# Patient Record
Sex: Female | Born: 1970 | Race: Black or African American | Hispanic: No | Smoking: Never smoker
Health system: Southern US, Community
[De-identification: ages and names within clinical notes are randomized; demographics above are authoritative.]

## PROBLEM LIST (undated history)

## (undated) DIAGNOSIS — K219 Gastro-esophageal reflux disease without esophagitis: Secondary | ICD-10-CM

## (undated) DIAGNOSIS — T7840XA Allergy, unspecified, initial encounter: Secondary | ICD-10-CM

## (undated) DIAGNOSIS — R7303 Prediabetes: Secondary | ICD-10-CM

## (undated) DIAGNOSIS — M5137 Other intervertebral disc degeneration, lumbosacral region: Secondary | ICD-10-CM

## (undated) HISTORY — DX: Other intervertebral disc degeneration, lumbosacral region: M51.37

## (undated) HISTORY — DX: Gastro-esophageal reflux disease without esophagitis: K21.9

## (undated) HISTORY — DX: Allergy, unspecified, initial encounter: T78.40XA

## (undated) HISTORY — DX: Prediabetes: R73.03

---

## 2009-12-04 DIAGNOSIS — M51379 Other intervertebral disc degeneration, lumbosacral region without mention of lumbar back pain or lower extremity pain: Secondary | ICD-10-CM

## 2009-12-04 DIAGNOSIS — M5137 Other intervertebral disc degeneration, lumbosacral region: Secondary | ICD-10-CM

## 2009-12-04 HISTORY — DX: Other intervertebral disc degeneration, lumbosacral region: M51.37

## 2009-12-04 HISTORY — DX: Other intervertebral disc degeneration, lumbosacral region without mention of lumbar back pain or lower extremity pain: M51.379

## 2009-12-08 ENCOUNTER — Encounter: Admission: RE | Admit: 2009-12-08 | Discharge: 2009-12-08 | Payer: Self-pay | Admitting: Infectious Diseases

## 2010-05-20 ENCOUNTER — Emergency Department (HOSPITAL_COMMUNITY): Admission: EM | Admit: 2010-05-20 | Discharge: 2010-05-20 | Payer: Self-pay | Admitting: Family Medicine

## 2010-08-25 ENCOUNTER — Encounter: Admission: RE | Admit: 2010-08-25 | Discharge: 2010-08-25 | Payer: Self-pay | Admitting: Orthopedic Surgery

## 2010-09-12 ENCOUNTER — Encounter: Admission: RE | Admit: 2010-09-12 | Discharge: 2010-09-12 | Payer: Self-pay | Admitting: Orthopedic Surgery

## 2010-10-07 ENCOUNTER — Encounter: Admission: RE | Admit: 2010-10-07 | Discharge: 2010-10-07 | Payer: Self-pay | Admitting: Orthopedic Surgery

## 2011-02-19 LAB — POCT URINALYSIS DIP (DEVICE)
Glucose, UA: NEGATIVE mg/dL
Hgb urine dipstick: NEGATIVE
Ketones, ur: NEGATIVE mg/dL
Nitrite: NEGATIVE
Protein, ur: NEGATIVE mg/dL
Specific Gravity, Urine: 1.02 (ref 1.005–1.030)
Urobilinogen, UA: 1 mg/dL (ref 0.0–1.0)
pH: 5.5 (ref 5.0–8.0)

## 2011-02-19 LAB — POCT PREGNANCY, URINE: Preg Test, Ur: NEGATIVE

## 2012-12-04 DIAGNOSIS — R7303 Prediabetes: Secondary | ICD-10-CM

## 2012-12-04 HISTORY — DX: Prediabetes: R73.03

## 2013-03-04 ENCOUNTER — Other Ambulatory Visit (INDEPENDENT_AMBULATORY_CARE_PROVIDER_SITE_OTHER): Payer: No Typology Code available for payment source

## 2013-03-04 ENCOUNTER — Ambulatory Visit: Payer: No Typology Code available for payment source

## 2013-03-04 ENCOUNTER — Encounter: Payer: Self-pay | Admitting: Internal Medicine

## 2013-03-04 ENCOUNTER — Ambulatory Visit (INDEPENDENT_AMBULATORY_CARE_PROVIDER_SITE_OTHER): Payer: No Typology Code available for payment source | Admitting: Internal Medicine

## 2013-03-04 ENCOUNTER — Ambulatory Visit: Payer: Self-pay | Admitting: Internal Medicine

## 2013-03-04 VITALS — BP 130/72 | HR 78 | Temp 98.1°F | Ht 67.0 in | Wt 181.0 lb

## 2013-03-04 DIAGNOSIS — Z13220 Encounter for screening for lipoid disorders: Secondary | ICD-10-CM

## 2013-03-04 DIAGNOSIS — Z1329 Encounter for screening for other suspected endocrine disorder: Secondary | ICD-10-CM

## 2013-03-04 DIAGNOSIS — Z0000 Encounter for general adult medical examination without abnormal findings: Secondary | ICD-10-CM

## 2013-03-04 DIAGNOSIS — M5430 Sciatica, unspecified side: Secondary | ICD-10-CM | POA: Insufficient documentation

## 2013-03-04 DIAGNOSIS — Z131 Encounter for screening for diabetes mellitus: Secondary | ICD-10-CM

## 2013-03-04 DIAGNOSIS — M5432 Sciatica, left side: Secondary | ICD-10-CM

## 2013-03-04 DIAGNOSIS — Z1239 Encounter for other screening for malignant neoplasm of breast: Secondary | ICD-10-CM

## 2013-03-04 DIAGNOSIS — R1013 Epigastric pain: Secondary | ICD-10-CM

## 2013-03-04 DIAGNOSIS — Z130 Encounter for screening for diseases of the blood and blood-forming organs and certain disorders involving the immune mechanism: Secondary | ICD-10-CM

## 2013-03-04 LAB — CBC
HCT: 41.7 % (ref 36.0–46.0)
MCHC: 33.5 g/dL (ref 30.0–36.0)
MCV: 84.2 fl (ref 78.0–100.0)
Platelets: 264 10*3/uL (ref 150.0–400.0)
RBC: 4.95 Mil/uL (ref 3.87–5.11)
RDW: 12.8 % (ref 11.5–14.6)
WBC: 4.1 10*3/uL — ABNORMAL LOW (ref 4.5–10.5)

## 2013-03-04 LAB — BASIC METABOLIC PANEL
CO2: 27 mEq/L (ref 19–32)
Calcium: 8.8 mg/dL (ref 8.4–10.5)
Chloride: 107 mEq/L (ref 96–112)
GFR: 108.03 mL/min (ref >60.00)
Glucose, Bld: 106 mg/dL — ABNORMAL HIGH (ref 70–99)
Potassium: 4.4 mEq/L (ref 3.5–5.1)
Sodium: 139 mEq/L (ref 135–145)

## 2013-03-04 LAB — HEPATIC FUNCTION PANEL
ALT: 29 U/L (ref 0–35)
AST: 25 U/L (ref 0–37)
Albumin: 3.7 g/dL (ref 3.5–5.2)
Alkaline Phosphatase: 62 U/L (ref 39–117)
Bilirubin, Direct: 0.1 mg/dL (ref 0.0–0.3)
Total Bilirubin: 0.6 mg/dL (ref 0.3–1.2)
Total Protein: 7.2 g/dL (ref 6.0–8.3)

## 2013-03-04 LAB — LIPID PANEL
Cholesterol: 129 mg/dL (ref 0–200)
HDL: 49.9 mg/dL (ref >39.00)
Total CHOL/HDL Ratio: 3
Triglycerides: 63 mg/dL (ref 0.0–149.0)

## 2013-03-04 LAB — HEMOGLOBIN A1C: Hgb A1c MFr Bld: 5.8 % (ref 4.6–6.5)

## 2013-03-04 LAB — TSH: TSH: 1.93 u[IU]/mL (ref 0.35–5.50)

## 2013-03-04 MED ORDER — PREDNISONE 10 MG PO TABS: ORAL_TABLET | ORAL | Status: DC

## 2013-03-04 NOTE — Progress Notes (Signed)
HPI  Pt presents to the clinic today to establish care. She has not seen a physician since 2010, when she came to the [place] from [place]. She does have some concerns today. 1- she has lower back pain on the left with sharp shooting pains into her left leg. She does take Advil for this but it doesn't always seem to help.  She denies having an injury to her back. She denies loss of bowel or bladder. She does have a very intense job where she bends, lifts and reaches all the time. She thinks this is a result of her job. 2- She is having epigastric pain with nausea. This is a new problem for her. She has not vomited. She reports that it feels different than reflux. The pain in not made worse or relieved by eating. Sometimes the pain radiates through to her back.  Flu; never Tetanus: 2010 LMP: 12/2012 (on depo) Pap smear: 2010 Mammogram: never Eye doctor: never Dentist :as needed  History reviewed. No pertinent past medical history.  Current Outpatient Prescriptions  Medication Sig Dispense Refill  . ibuprofen (ADVIL,MOTRIN) 200 MG tablet Take 200 mg by mouth every 6 (six) hours as needed for pain.      . medroxyPROGESTERone (DEPO-PROVERA) 150 MG/ML injection Inject 150 mg into the muscle every 3 (three) months.       No current facility-administered medications for this visit.    No Known Allergies  History reviewed. No pertinent family history.  History   Social History  . Marital Status: Unknown    Spouse Name: N/A    Number of Children: N/A  . Years of Education: N/A   Occupational History  . housekeeping    Social History Main Topics  . Smoking status: Never Smoker   . Smokeless tobacco: Never Used  . Alcohol Use: Yes     Comment: once month  . Drug Use: No  . Sexually Active: Yes   Other Topics Concern  . Not on file   Social History Narrative   Regular exercise-yes   Caffeine Use-on occasion    ROS:  Constitutional: Pt reports intermittent headaches.  Denies fever, malaise, fatigue, or abrupt weight changes.  HEENT: Pt reports intermittent scratchy throat. Denies eye pain, eye redness, ear pain, ringing in the ears, wax buildup, runny nose, nasal congestion, bloody nose, or sore throat. Respiratory: Denies difficulty breathing, shortness of breath, cough or sputum production.   Cardiovascular: Denies chest pain, chest tightness, palpitations or swelling in the hands or feet.  Gastrointestinal: Pt reports constant epigastric pain. Denies bloating, constipation, diarrhea or blood in the stool.  GU: Denies frequency, urgency, pain with urination, blood in urine, odor or discharge. Musculoskeletal: Pt reports low back pain. Denies decrease in range of motion, difficulty with gait or joint pain and swelling.  Skin: Denies redness, rashes, lesions or ulcercations.  Neurological: Pt reports sharp shooting pains into her left leg. Denies dizziness, difficulty with memory, difficulty with speech or problems with balance and coordination.   No other specific complaints in a complete review of systems (except as listed in HPI above).  PE:  BP 130/72  Pulse 78  Temp(Src) 98.1 F (36.7 C) (Oral)  Ht 5\' 7"  (1.702 m)  Wt 181 lb (82.101 kg)  BMI 28.34 kg/m2  SpO2 99% Wt Readings from Last 3 Encounters:  03/04/13 181 lb (82.101 kg)    General: Appears her stated age, well developed, well nourished in NAD. HEENT: Head: normal shape and size; Eyes:  sclera white, no icterus, conjunctiva pink, PERRLA and EOMs intact; Ears: Tm's gray and intact, normal light reflex; Nose: mucosa pink and moist, septum midline; Throat/Mouth: Teeth present, mucosa pink and moist, no lesions or ulcerations noted.  Neck: Normal range of motion. Neck supple, trachea midline. No massses, lumps or thyromegaly present.  Cardiovascular: Normal rate and rhythm. S1,S2 noted.  Murmur noted. No rubs or gallops noted. No JVD or BLE edema. No carotid bruits noted. Pulmonary/Chest:  Normal effort and positive vesicular breath sounds. No respiratory distress. No wheezes, rales or ronchi noted.  Abdomen: Soft and tender in the epigastric area. Normal bowel sounds, no bruits noted. No distention or masses noted. Liver, spleen and kidneys non palpable. Musculoskeletal: Normal range of motion. No signs of joint swelling. No difficulty with gait.  Neurological: Alert and oriented. Cranial nerves II-XII intact. Coordination normal. +DTRs bilaterally. Positive straight leg raise. Psychiatric: Mood and affect normal. Behavior is normal. Judgment and thought content normal.    Assessment and Plan:  Preventative Health Maintenance:  Will obtain basic screening labs today Pt will call to set up pap smear Will set up for mammogram Pt declines flu shot today

## 2013-03-04 NOTE — Assessment & Plan Note (Signed)
Will obtain LFT's to look for cholecystitis Will obtain abdominal ultrasound to r/o gallstones Pt declined RX for anti nausea medicine at this time

## 2013-03-04 NOTE — Assessment & Plan Note (Signed)
Pt has had xray of lumbar spine Will send over records from xray [facility] for pred taper Perform stretching exercises as indicated on handout

## 2013-03-04 NOTE — Patient Instructions (Signed)
Preventive Care for Adults, Female A healthy lifestyle and preventive care can promote health and wellness. Preventive health guidelines for women include the following key practices.  A routine yearly physical is a good way to check with your caregiver about your health and preventive screening. It is a chance to share any concerns and updates on your health, and to receive a thorough exam.  Visit your dentist for a routine exam and preventive care every 6 months. Brush your teeth twice a day and floss once a day. Good oral hygiene prevents tooth decay and gum disease.  The frequency of eye exams is based on your age, health, family medical history, use of contact lenses, and other factors. Follow your caregiver's recommendations for frequency of eye exams.  Eat a healthy diet. Foods like vegetables, fruits, whole grains, low-fat dairy products, and lean protein foods contain the nutrients you need without too many calories. Decrease your intake of foods high in solid fats, added sugars, and salt. Eat the right amount of calories for you.Get information about a proper diet from your caregiver, if necessary.  Regular physical exercise is one of the most important things you can do for your health. Most adults should get at least 150 minutes of moderate-intensity exercise (any activity that increases your heart rate and causes you to sweat) each week. In addition, most adults need muscle-strengthening exercises on 2 or more days a week.  Maintain a healthy weight. The body mass index (BMI) is a screening tool to identify possible weight problems. It provides an estimate of body fat based on height and weight. Your caregiver can help determine your BMI, and can help you achieve or maintain a healthy weight.For adults 20 years and older:  A BMI below 18.5 is considered underweight.  A BMI of 18.5 to 24.9 is normal.  A BMI of 25 to 29.9 is considered overweight.  A BMI of 30 and above is  considered obese.  Maintain normal blood lipids and cholesterol levels by exercising and minimizing your intake of saturated fat. Eat a balanced diet with plenty of fruit and vegetables. Blood tests for lipids and cholesterol should begin at age 20 and be repeated every 5 years. If your lipid or cholesterol levels are high, you are over 50, or you are at high risk for heart disease, you may need your cholesterol levels checked more frequently.Ongoing high lipid and cholesterol levels should be treated with medicines if diet and exercise are not effective.  If you smoke, find out from your caregiver how to quit. If you do not use tobacco, do not start.  If you are pregnant, do not drink alcohol. If you are breastfeeding, be very cautious about drinking alcohol. If you are not pregnant and choose to drink alcohol, do not exceed 1 drink per day. One drink is considered to be 12 ounces (355 mL) of beer, 5 ounces (148 mL) of wine, or 1.5 ounces (44 mL) of liquor.  Avoid use of street drugs. Do not share needles with anyone. Ask for help if you need support or instructions about stopping the use of drugs.  High blood pressure causes heart disease and increases the risk of stroke. Your blood pressure should be checked at least every 1 to 2 years. Ongoing high blood pressure should be treated with medicines if weight loss and exercise are not effective.  If you are 55 to 42 years old, ask your caregiver if you should take aspirin to prevent strokes.  Diabetes   screening involves taking a blood sample to check your fasting blood sugar level. This should be done once every 3 years, after age 45, if you are within normal weight and without risk factors for diabetes. Testing should be considered at a younger age or be carried out more frequently if you are overweight and have at least 1 risk factor for diabetes.  Breast cancer screening is essential preventive care for women. You should practice "breast  self-awareness." This means understanding the normal appearance and feel of your breasts and may include breast self-examination. Any changes detected, no matter how small, should be reported to a caregiver. Women in their 20s and 30s should have a clinical breast exam (CBE) by a caregiver as part of a regular health exam every 1 to 3 years. After age 40, women should have a CBE every year. Starting at age 40, women should consider having a mammography (breast X-ray test) every year. Women who have a family history of breast cancer should talk to their caregiver about genetic screening. Women at a high risk of breast cancer should talk to their caregivers about having magnetic resonance imaging (MRI) and a mammography every year.  The Pap test is a screening test for cervical cancer. A Pap test can show cell changes on the cervix that might become cervical cancer if left untreated. A Pap test is a procedure in which cells are obtained and examined from the lower end of the uterus (cervix).  Women should have a Pap test starting at age 21.  Between ages 21 and 29, Pap tests should be repeated every 2 years.  Beginning at age 30, you should have a Pap test every 3 years as long as the past 3 Pap tests have been normal.  Some women have medical problems that increase the chance of getting cervical cancer. Talk to your caregiver about these problems. It is especially important to talk to your caregiver if a new problem develops soon after your last Pap test. In these cases, your caregiver may recommend more frequent screening and Pap tests.  The above recommendations are the same for women who have or have not gotten the vaccine for human papillomavirus (HPV).  If you had a hysterectomy for a problem that was not cancer or a condition that could lead to cancer, then you no longer need Pap tests. Even if you no longer need a Pap test, a regular exam is a good idea to make sure no other problems are  starting.  If you are between ages 65 and 70, and you have had normal Pap tests going back 10 years, you no longer need Pap tests. Even if you no longer need a Pap test, a regular exam is a good idea to make sure no other problems are starting.  If you have had past treatment for cervical cancer or a condition that could lead to cancer, you need Pap tests and screening for cancer for at least 20 years after your treatment.  If Pap tests have been discontinued, risk factors (such as a new sexual partner) need to be reassessed to determine if screening should be resumed.  The HPV test is an additional test that may be used for cervical cancer screening. The HPV test looks for the virus that can cause the cell changes on the cervix. The cells collected during the Pap test can be tested for HPV. The HPV test could be used to screen women aged 30 years and older, and should   be used in women of any age who have unclear Pap test results. After the age of 30, women should have HPV testing at the same frequency as a Pap test.  Colorectal cancer can be detected and often prevented. Most routine colorectal cancer screening begins at the age of 50 and continues through age 75. However, your caregiver may recommend screening at an earlier age if you have risk factors for colon cancer. On a yearly basis, your caregiver may provide home test kits to check for hidden blood in the stool. Use of a small camera at the end of a tube, to directly examine the colon (sigmoidoscopy or colonoscopy), can detect the earliest forms of colorectal cancer. Talk to your caregiver about this at age 50, when routine screening begins. Direct examination of the colon should be repeated every 5 to 10 years through age 75, unless early forms of pre-cancerous polyps or small growths are found.  Hepatitis C blood testing is recommended for all people born from 1945 through 1965 and any individual with known risks for hepatitis C.  Practice  safe sex. Use condoms and avoid high-risk sexual practices to reduce the spread of sexually transmitted infections (STIs). STIs include gonorrhea, chlamydia, syphilis, trichomonas, herpes, HPV, and human immunodeficiency virus (HIV). Herpes, HIV, and HPV are viral illnesses that have no cure. They can result in disability, cancer, and death. Sexually active women aged 25 and younger should be checked for chlamydia. Older women with new or multiple partners should also be tested for chlamydia. Testing for other STIs is recommended if you are sexually active and at increased risk.  Osteoporosis is a disease in which the bones lose minerals and strength with aging. This can result in serious bone fractures. The risk of osteoporosis can be identified using a bone density scan. Women ages 65 and over and women at risk for fractures or osteoporosis should discuss screening with their caregivers. Ask your caregiver whether you should take a calcium supplement or vitamin D to reduce the rate of osteoporosis.  Menopause can be associated with physical symptoms and risks. Hormone replacement therapy is available to decrease symptoms and risks. You should talk to your caregiver about whether hormone replacement therapy is right for you.  Use sunscreen with sun protection factor (SPF) of 30 or more. Apply sunscreen liberally and repeatedly throughout the day. You should seek shade when your shadow is shorter than you. Protect yourself by wearing long sleeves, pants, a wide-brimmed hat, and sunglasses year round, whenever you are outdoors.  Once a month, do a whole body skin exam, using a mirror to look at the skin on your back. Notify your caregiver of new moles, moles that have irregular borders, moles that are larger than a pencil eraser, or moles that have changed in shape or color.  Stay current with required immunizations.  Influenza. You need a dose every fall (or winter). The composition of the flu vaccine  changes each year, so being vaccinated once is not enough.  Pneumococcal polysaccharide. You need 1 to 2 doses if you smoke cigarettes or if you have certain chronic medical conditions. You need 1 dose at age 65 (or older) if you have never been vaccinated.  Tetanus, diphtheria, pertussis (Tdap, Td). Get 1 dose of Tdap vaccine if you are younger than age 65, are over 65 and have contact with an infant, are a healthcare worker, are pregnant, or simply want to be protected from whooping cough. After that, you need a Td   booster dose every 10 years. Consult your caregiver if you have not had at least 3 tetanus and diphtheria-containing shots sometime in your life or have a deep or dirty wound.  HPV. You need this vaccine if you are a woman age 26 or younger. The vaccine is given in 3 doses over 6 months.  Measles, mumps, rubella (MMR). You need at least 1 dose of MMR if you were born in 1957 or later. You may also need a second dose.  Meningococcal. If you are age 19 to 21 and a first-year college student living in a residence hall, or have one of several medical conditions, you need to get vaccinated against meningococcal disease. You may also need additional booster doses.  Zoster (shingles). If you are age 60 or older, you should get this vaccine.  Varicella (chickenpox). If you have never had chickenpox or you were vaccinated but received only 1 dose, talk to your caregiver to find out if you need this vaccine.  Hepatitis A. You need this vaccine if you have a specific risk factor for hepatitis A virus infection or you simply wish to be protected from this disease. The vaccine is usually given as 2 doses, 6 to 18 months apart.  Hepatitis B. You need this vaccine if you have a specific risk factor for hepatitis B virus infection or you simply wish to be protected from this disease. The vaccine is given in 3 doses, usually over 6 months. Preventive Services / Frequency Ages 19 to 39  Blood  pressure check.** / Every 1 to 2 years.  Lipid and cholesterol check.** / Every 5 years beginning at age 20.  Clinical breast exam.** / Every 3 years for women in their 20s and 30s.  Pap test.** / Every 2 years from ages 21 through 29. Every 3 years starting at age 30 through age 65 or 70 with a history of 3 consecutive normal Pap tests.  HPV screening.** / Every 3 years from ages 30 through ages 65 to 70 with a history of 3 consecutive normal Pap tests.  Hepatitis C blood test.** / For any individual with known risks for hepatitis C.  Skin self-exam. / Monthly.  Influenza immunization.** / Every year.  Pneumococcal polysaccharide immunization.** / 1 to 2 doses if you smoke cigarettes or if you have certain chronic medical conditions.  Tetanus, diphtheria, pertussis (Tdap, Td) immunization. / A one-time dose of Tdap vaccine. After that, you need a Td booster dose every 10 years.  HPV immunization. / 3 doses over 6 months, if you are 26 and younger.  Measles, mumps, rubella (MMR) immunization. / You need at least 1 dose of MMR if you were born in 1957 or later. You may also need a second dose.  Meningococcal immunization. / 1 dose if you are age 19 to 21 and a first-year college student living in a residence hall, or have one of several medical conditions, you need to get vaccinated against meningococcal disease. You may also need additional booster doses.  Varicella immunization.** / Consult your caregiver.  Hepatitis A immunization.** / Consult your caregiver. 2 doses, 6 to 18 months apart.  Hepatitis B immunization.** / Consult your caregiver. 3 doses usually over 6 months. Ages 40 to 64  Blood pressure check.** / Every 1 to 2 years.  Lipid and cholesterol check.** / Every 5 years beginning at age 20.  Clinical breast exam.** / Every year after age 40.  Mammogram.** / Every year beginning at age 40   and continuing for as long as you are in good health. Consult with your  caregiver.  Pap test.** / Every 3 years starting at age 30 through age 65 or 70 with a history of 3 consecutive normal Pap tests.  HPV screening.** / Every 3 years from ages 30 through ages 65 to 70 with a history of 3 consecutive normal Pap tests.  Fecal occult blood test (FOBT) of stool. / Every year beginning at age 50 and continuing until age 75. You may not need to do this test if you get a colonoscopy every 10 years.  Flexible sigmoidoscopy or colonoscopy.** / Every 5 years for a flexible sigmoidoscopy or every 10 years for a colonoscopy beginning at age 50 and continuing until age 75.  Hepatitis C blood test.** / For all people born from 1945 through 1965 and any individual with known risks for hepatitis C.  Skin self-exam. / Monthly.  Influenza immunization.** / Every year.  Pneumococcal polysaccharide immunization.** / 1 to 2 doses if you smoke cigarettes or if you have certain chronic medical conditions.  Tetanus, diphtheria, pertussis (Tdap, Td) immunization.** / A one-time dose of Tdap vaccine. After that, you need a Td booster dose every 10 years.  Measles, mumps, rubella (MMR) immunization. / You need at least 1 dose of MMR if you were born in 1957 or later. You may also need a second dose.  Varicella immunization.** / Consult your caregiver.  Meningococcal immunization.** / Consult your caregiver.  Hepatitis A immunization.** / Consult your caregiver. 2 doses, 6 to 18 months apart.  Hepatitis B immunization.** / Consult your caregiver. 3 doses, usually over 6 months. Ages 65 and over  Blood pressure check.** / Every 1 to 2 years.  Lipid and cholesterol check.** / Every 5 years beginning at age 20.  Clinical breast exam.** / Every year after age 40.  Mammogram.** / Every year beginning at age 40 and continuing for as long as you are in good health. Consult with your caregiver.  Pap test.** / Every 3 years starting at age 30 through age 65 or 70 with a 3  consecutive normal Pap tests. Testing can be stopped between 65 and 70 with 3 consecutive normal Pap tests and no abnormal Pap or HPV tests in the past 10 years.  HPV screening.** / Every 3 years from ages 30 through ages 65 or 70 with a history of 3 consecutive normal Pap tests. Testing can be stopped between 65 and 70 with 3 consecutive normal Pap tests and no abnormal Pap or HPV tests in the past 10 years.  Fecal occult blood test (FOBT) of stool. / Every year beginning at age 50 and continuing until age 75. You may not need to do this test if you get a colonoscopy every 10 years.  Flexible sigmoidoscopy or colonoscopy.** / Every 5 years for a flexible sigmoidoscopy or every 10 years for a colonoscopy beginning at age 50 and continuing until age 75.  Hepatitis C blood test.** / For all people born from 1945 through 1965 and any individual with known risks for hepatitis C.  Osteoporosis screening.** / A one-time screening for women ages 65 and over and women at risk for fractures or osteoporosis.  Skin self-exam. / Monthly.  Influenza immunization.** / Every year.  Pneumococcal polysaccharide immunization.** / 1 dose at age 65 (or older) if you have never been vaccinated.  Tetanus, diphtheria, pertussis (Tdap, Td) immunization. / A one-time dose of Tdap vaccine if you are over   65 and have contact with an infant, are a healthcare worker, or simply want to be protected from whooping cough. After that, you need a Td booster dose every 10 years.  Varicella immunization.** / Consult your caregiver.  Meningococcal immunization.** / Consult your caregiver.  Hepatitis A immunization.** / Consult your caregiver. 2 doses, 6 to 18 months apart.  Hepatitis B immunization.** / Check with your caregiver. 3 doses, usually over 6 months. ** Family history and personal history of risk and conditions may change your caregiver's recommendations. Document Released: 01/16/2002 Document Revised: 02/12/2012  Document Reviewed: 04/17/2011 [employer] Patient Information 2013 [employer].  

## 2013-03-12 ENCOUNTER — Ambulatory Visit (HOSPITAL_COMMUNITY)
Admission: RE | Admit: 2013-03-12 | Discharge: 2013-03-12 | Disposition: A | Discharge status: Home / Self Care | Payer: Self-pay | Source: Ambulatory Visit | Attending: Internal Medicine | Admitting: Internal Medicine

## 2013-03-12 DIAGNOSIS — Z1231 Encounter for screening mammogram for malignant neoplasm of breast: Secondary | ICD-10-CM | POA: Insufficient documentation

## 2013-03-12 DIAGNOSIS — R1013 Epigastric pain: Secondary | ICD-10-CM | POA: Insufficient documentation

## 2013-03-12 DIAGNOSIS — Z0000 Encounter for general adult medical examination without abnormal findings: Secondary | ICD-10-CM

## 2013-03-12 DIAGNOSIS — Z1239 Encounter for other screening for malignant neoplasm of breast: Secondary | ICD-10-CM

## 2013-04-02 ENCOUNTER — Encounter: Payer: Self-pay | Admitting: Internal Medicine

## 2013-04-02 ENCOUNTER — Other Ambulatory Visit (HOSPITAL_COMMUNITY)
Admission: RE | Admit: 2013-04-02 | Discharge: 2013-04-02 | Disposition: A | Discharge status: Home / Self Care | Payer: No Typology Code available for payment source | Source: Ambulatory Visit | Attending: Internal Medicine | Admitting: Internal Medicine

## 2013-04-02 ENCOUNTER — Ambulatory Visit (INDEPENDENT_AMBULATORY_CARE_PROVIDER_SITE_OTHER): Payer: No Typology Code available for payment source | Admitting: Internal Medicine

## 2013-04-02 VITALS — BP 110/82 | HR 75 | Temp 98.2°F | Ht 67.0 in | Wt 181.0 lb

## 2013-04-02 DIAGNOSIS — Z01419 Encounter for gynecological examination (general) (routine) without abnormal findings: Secondary | ICD-10-CM | POA: Insufficient documentation

## 2013-04-02 DIAGNOSIS — R1013 Epigastric pain: Secondary | ICD-10-CM

## 2013-04-02 DIAGNOSIS — Z124 Encounter for screening for malignant neoplasm of cervix: Secondary | ICD-10-CM

## 2013-04-02 NOTE — Patient Instructions (Signed)

## 2013-04-02 NOTE — Progress Notes (Signed)
  Subjective:    Patient ID: Tami Martin, female    DOB: Sep 22, 1971, 42 y.o.   MRN: [id number]  HPI  Pt presents to the clinic today for her pap smear. Her period did just start 1 hour ago. It is light. She does have some concerns today about the continue abdominal pain. The pain is constant 6/10 in the left upper quadrant. It hurts without relation to eating. She feels like she can feel a lump there. Ultrasound was negative for mass or gallstones. She would like further evaluation today.  Review of Systems      History reviewed. No pertinent past medical history.  Current Outpatient Prescriptions  Medication Sig Dispense Refill  . Calcium Carbonate-Vit D-Min 1200-1000 MG-UNIT CHEW Chew 1 capsule by mouth daily.      Marland Kitchen ibuprofen (ADVIL,MOTRIN) 200 MG tablet Take 200 mg by mouth every 6 (six) hours as needed for pain.      . medroxyPROGESTERone (DEPO-PROVERA) 150 MG/ML injection Inject 150 mg into the muscle every 3 (three) months.       No current facility-administered medications for this visit.    No Known Allergies  History reviewed. No pertinent family history.  History   Social History  . Marital Status: Married    Spouse Name: N/A    Number of Children: N/A  . Years of Education: N/A   Occupational History  . housekeeping    Social History Main Topics  . Smoking status: Never Smoker   . Smokeless tobacco: Never Used  . Alcohol Use: Yes     Comment: once month  . Drug Use: No  . Sexually Active: Yes   Other Topics Concern  . Not on file   Social History Narrative   Regular exercise-yes   Caffeine Use-on occasion     Constitutional: Denies fever, malaise, fatigue, headache or abrupt weight changes.  Gastrointestinal: Pt reports abdominal pain. Denies bloating, constipation, diarrhea or blood in the stool.  GU: Pt is on her menstrual cycle. Denies urgency, frequency, pain with urination, burning sensation, blood in urine, odor or discharge.  No  other specific complaints in a complete review of systems (except as listed in HPI above).  Objective:   Physical Exam   Constitutional:  Alert, oriented x 4, well developed, well nourished in no apparent distress.  Cardiovascular: Normal rate and rhythm. S1,S2 noted.  No murmur, rubs or gallops noted. No JVD or BLE edema. No carotid bruits noted. Pulmonary/Chest: Normal effort and positive vesicular breath sounds. No respiratory distress. No wheezes, rales or ronchi noted.  Abdomenl: Soft and nontender. Normal bowel sounds, no bruits noted.palpable mass noted in LUQ with cough.. Liver, spleen and kidneys non palpable. Genitourinary: Normal female anatomy. Uterus midline, anterior and soft. No CMT or discharge noted. Adenexa non palpable. Breast without lumps or masses. Small amount of vaginal bleeding noted.      Assessment & Plan:   Screen for cervical cancer with routine gyn exam:  Pap smear obtained, will send tissue off for sampling Bimanual performed  LUQ versus epigastric pain with palpable mass but negative ultrasound:  Concern for hiatal hernia Will refer to GI for further evaluation

## 2013-04-21 ENCOUNTER — Encounter: Payer: Self-pay | Admitting: Internal Medicine

## 2013-05-07 ENCOUNTER — Other Ambulatory Visit: Payer: Self-pay | Admitting: Internal Medicine

## 2013-05-07 ENCOUNTER — Ambulatory Visit (INDEPENDENT_AMBULATORY_CARE_PROVIDER_SITE_OTHER): Payer: No Typology Code available for payment source | Admitting: Internal Medicine

## 2013-05-07 ENCOUNTER — Ambulatory Visit (AMBULATORY_SURGERY_CENTER): Payer: No Typology Code available for payment source | Admitting: Internal Medicine

## 2013-05-07 ENCOUNTER — Encounter: Payer: Self-pay | Admitting: Internal Medicine

## 2013-05-07 ENCOUNTER — Telehealth: Payer: Self-pay

## 2013-05-07 VITALS — BP 130/76 | HR 60 | Ht 67.0 in | Wt 178.0 lb

## 2013-05-07 VITALS — BP 155/83 | HR 67 | Temp 99.3°F | Resp 20 | Ht 67.0 in | Wt 178.0 lb

## 2013-05-07 DIAGNOSIS — R634 Abnormal weight loss: Secondary | ICD-10-CM

## 2013-05-07 DIAGNOSIS — R109 Unspecified abdominal pain: Secondary | ICD-10-CM

## 2013-05-07 MED ORDER — SODIUM CHLORIDE 0.9 % IV SOLN: 500.0000 mL | INTRAVENOUS | Status: DC

## 2013-05-07 NOTE — Progress Notes (Signed)
Patient did not have preoperative order for IV antibiotic SSI prophylaxis. (G8918)  Patient did not experience any of the following events: a burn prior to discharge; a fall within the facility; wrong site/side/patient/procedure/implant event; or a hospital transfer or hospital admission upon discharge from the facility. (G8907)  

## 2013-05-07 NOTE — Op Note (Signed)
[facility] [address] [place], [address]   ENDOSCOPY PROCEDURE REPORT  PATIENT: Tami Martin, Tami Martin  MR#: [id number] BIRTHDATE: Apr 28, 1971 , 42  yrs. old GENDER: Female ENDOSCOPIST: Roxy Cedar, MD REFERRED BY:  .  Nicki Reaper, NP PROCEDURE DATE:  05/07/2013 PROCEDURE:  EGD, diagnostic ASA CLASS:     Class I INDICATIONS:  abdominal pain.   Weight loss. MEDICATIONS: MAC sedation, administered by CRNA and propofol (Diprivan) 300mg  IV TOPICAL ANESTHETIC: none  DESCRIPTION OF PROCEDURE: After the risks benefits and alternatives of the procedure were thoroughly explained, informed consent was obtained.  The LB [id number] endoscope was introduced through the mouth and advanced to the second portion of the duodenum. Without limitations.  The instrument was slowly withdrawn as the mucosa was fully examined.      EXAM: The upper, middle and distal third of the esophagus were carefully inspected and no abnormalities were noted.  The z-line was well seen at the GEJ.  The endoscope was pushed into the fundus which was normal including a retroflexed view.  The antrum, gastric body, first and second part of the duodenum were unremarkable. Retroflexed views revealed no abnormalities.     The scope was then withdrawn from the patient and the procedure completed.  COMPLICATIONS: There were no complications. ENDOSCOPIC IMPRESSION: 1. Normal EGD  RECOMMENDATIONS: 1. Contrast enhanced CT Scan of the Abdomen with fine cuts through the pancrease "UPPER ADOMINAL PAIN, WEIGHT LOSS"  REPEAT EXAM:  eSigned:  Roxy Cedar, MD 05/07/2013 2:48 PM   CC:The Patient  , Nicki Reaper, NP

## 2013-05-07 NOTE — Telephone Encounter (Signed)
Pt scheduled for CT of abdomen with pancreatic protocol. Pt scheduled for 05/09/13, pt to arrive at 10am. Pt to drink water based contrast at [facility] CT. Pt to be NPO after midnight. Natalie RN in the [facility] gave pt appt date and time.

## 2013-05-07 NOTE — Patient Instructions (Addendum)
Normal endoscopy Ct scan scheduled for Friday June 6th at 10:00am You will drink a contrast once you arrive Nothing to eat or drink after 1200am that morning  YOU HAD AN ENDOSCOPIC PROCEDURE TODAY AT THE [facility]: Refer to the procedure report that was given to you for any specific questions about what was found during the examination.  If the procedure report does not answer your questions, please call your gastroenterologist to clarify.  If you requested that your care partner not be given the details of your procedure findings, then the procedure report has been included in a sealed envelope for you to review at your convenience later.  YOU SHOULD EXPECT: Some feelings of bloating in the abdomen. Passage of more gas than usual.  Walking can help get rid of the air that was put into your GI tract during the procedure and reduce the bloating. If you had a lower endoscopy (such as a colonoscopy or flexible sigmoidoscopy) you may notice spotting of blood in your stool or on the toilet paper. If you underwent a bowel prep for your procedure, then you may not have a normal bowel movement for a few days.  DIET: Your first meal following the procedure should be a light meal and then it is ok to progress to your normal diet.  A half-sandwich or bowl of soup is an example of a good first meal.  Heavy or fried foods are harder to digest and may make you feel nauseous or bloated.  Likewise meals heavy in dairy and vegetables can cause extra gas to form and this can also increase the bloating.  Drink plenty of fluids but you should avoid alcoholic beverages for 24 hours.  ACTIVITY: Your care partner should take you home directly after the procedure.  You should plan to take it easy, moving slowly for the rest of the day.  You can resume normal activity the day after the procedure however you should NOT DRIVE or use heavy machinery for 24 hours (because of the sedation medicines used during the  test).    SYMPTOMS TO REPORT IMMEDIATELY: A gastroenterologist can be reached at any hour.  During normal business hours, 8:30 AM to 5:00 PM Monday through Friday, call [phone].  After hours and on weekends, please call the GI answering service at [phone] who will take a message and have the physician on call contact you.   Following upper endoscopy (EGD)  Vomiting of blood or coffee ground material  New chest pain or pain under the shoulder blades  Painful or persistently difficult swallowing  New shortness of breath  Fever of 100F or higher  Black, tarry-looking stools  FOLLOW UP: If any biopsies were taken you will be contacted by phone or by letter within the next 1-3 weeks.  Call your gastroenterologist if you have not heard about the biopsies in 3 weeks.  Our staff will call the home number listed on your records the next business day following your procedure to check on you and address any questions or concerns that you may have at that time regarding the information given to you following your procedure. This is a courtesy call and so if there is no answer at the home number and we have not heard from you through the emergency physician on call, we will assume that you have returned to your regular daily activities without incident.  SIGNATURES/CONFIDENTIALITY: You and/or your care partner have signed paperwork which will be entered into your  electronic medical record.  These signatures attest to the fact that that the information above on your After Visit Summary has been reviewed and is understood.  Full responsibility of the confidentiality of this discharge information lies with you and/or your care-partner.

## 2013-05-07 NOTE — Progress Notes (Signed)
HISTORY OF PRESENT ILLNESS:  Tami Martin is a 41 y.o. female, native of the [place], who presents today, at the request of her primary provider, regarding abdominal pain. She is accompanied by her adult granddaughter. The patient reports a 6 month history of epigastric/left upper quadrant discomfort which is exacerbated by meals. She states that she is afraid to eat. Also reports weight loss, though cannot quantify. Occasional vomiting. No hematemesis, melena, or hematochezia. No lower abdominal complaints or change in bowel habits. She denies having had any empiric therapies, but review of outside records shows an abdominal ultrasound from 03/12/2013 to be unremarkable. Review of outside blood work shows normal comprehensive metabolic panel, lipids, CBC, and TSH 03/04/2013. In addition to calcium she takes ibuprofen as needed and Depo-Provera. She works as a Advertising copywriter. No family history of gastrointestinal malignancy  REVIEW OF SYSTEMS:  All non-GI ROS negative except for back pain, visual change, fatigue, occasional depression, headaches, muscle cramps, sore throat, painful urination  Past Medical History  Diagnosis Date  . GERD (gastroesophageal reflux disease)     History reviewed. No pertinent past surgical history.  Social History Tami Martin  reports that she has never smoked. She has never used smokeless tobacco. She reports that  drinks alcohol. She reports that she does not use illicit drugs.  family history is not on file.  No Known Allergies     PHYSICAL EXAMINATION: Vital signs: BP 130/76  Pulse 60  Ht 5\' 7"  (1.702 m)  Wt 178 lb (80.74 kg)  BMI 27.87 kg/m2  Constitutional: generally well-appearing, no acute distress Psychiatric: alert and oriented x3, cooperative Eyes: extraocular movements intact, anicteric, conjunctiva pink Mouth: oral pharynx moist, no lesions Neck: supple no lymphadenopathy Cardiovascular: heart regular rate  and rhythm, no murmur Lungs: clear to auscultation bilaterally Abdomen: soft, mild tenderness in the upper abdomen slightly left of the epigastric region, nondistended, no obvious ascites, no peritoneal signs, normal bowel sounds, no organomegaly Rectal: Omitted Extremities: no lower extremity edema bilaterally Skin: no lesions on visible extremities Neuro: No focal deficits.   ASSESSMENT:  #1. Six-month history of postprandial epigastric pain with food avoidance and weight loss. Negative ultrasound and laboratories. The patient takes NSAIDs. Rule out gastric ulcer   PLAN:  #1. Diagnostic upper endoscopy.The nature of the procedure, as well as the risks, benefits, and alternatives were carefully and thoroughly reviewed with the patient. Ample time for discussion and questions allowed. The patient understood, was satisfied, and agreed to proceed. We can perform this examination later today. #2. If endoscopy revealing, then treat accordingly. If not, contrast-enhanced CT scan with fine cuts through the pancreas.

## 2013-05-07 NOTE — Patient Instructions (Addendum)

## 2013-05-08 ENCOUNTER — Telehealth: Payer: Self-pay | Admitting: *Deleted

## 2013-05-08 NOTE — Telephone Encounter (Signed)
  Follow up Call-  Call back number 05/07/2013  Post procedure Call Back phone  # [phone]  Permission to leave phone message Yes     Patient questions:  Do you have a fever, pain , or abdominal swelling? no Pain Score  0 *  Have you tolerated food without any problems? yes  Have you been able to return to your normal activities? yes  Do you have any questions about your discharge instructions: Diet   no Medications  no Follow up visit  no  Do you have questions or concerns about your Care? no  Actions: * If pain score is 4 or above: No action needed, pain <4.

## 2013-05-09 ENCOUNTER — Other Ambulatory Visit: Payer: Self-pay | Admitting: Internal Medicine

## 2013-05-09 ENCOUNTER — Ambulatory Visit (INDEPENDENT_AMBULATORY_CARE_PROVIDER_SITE_OTHER)
Admission: RE | Admit: 2013-05-09 | Discharge: 2013-05-09 | Disposition: A | Discharge status: Home / Self Care | Payer: No Typology Code available for payment source | Source: Ambulatory Visit | Attending: Internal Medicine | Admitting: Internal Medicine

## 2013-05-09 DIAGNOSIS — R109 Unspecified abdominal pain: Secondary | ICD-10-CM

## 2013-05-09 MED ORDER — CILIDINIUM-CHLORDIAZEPOXIDE 2.5-5 MG PO CAPS: 1.0000 | ORAL_CAPSULE | Freq: Three times a day (TID) | ORAL | Status: DC

## 2013-05-09 MED ORDER — IOHEXOL 350 MG/ML SOLN
100.0000 mL | Freq: Once | INTRAVENOUS | Status: AC | PRN
  Administered 2013-05-09: 100 mL via INTRAVENOUS

## 2013-06-04 ENCOUNTER — Encounter: Payer: Self-pay | Admitting: Internal Medicine

## 2013-06-04 ENCOUNTER — Ambulatory Visit (INDEPENDENT_AMBULATORY_CARE_PROVIDER_SITE_OTHER): Payer: No Typology Code available for payment source | Admitting: Internal Medicine

## 2013-06-04 VITALS — BP 104/70 | HR 84 | Ht 67.0 in | Wt 178.5 lb

## 2013-06-04 DIAGNOSIS — R109 Unspecified abdominal pain: Secondary | ICD-10-CM

## 2013-06-04 MED ORDER — CILIDINIUM-CHLORDIAZEPOXIDE 2.5-5 MG PO CAPS: 1.0000 | ORAL_CAPSULE | Freq: Three times a day (TID) | ORAL | Status: DC

## 2013-06-04 NOTE — Progress Notes (Signed)
HISTORY OF PRESENT ILLNESS:  Tami Martin is a 42 y.o. female , native of the [place], who was evaluated in consultation 05/07/2013 regarding abdominal pain. See that dictation for details. Previsit abdominal ultrasound and laboratories were unremarkable. He did complain of postprandial discomfort and weight loss. She subsequently underwent upper endoscopy on 05/07/2013 (that same day). The examination was normal. Thus, CT scan of the abdomen and pelvis with contrast was ordered and performed on 05/09/2013. This was normal including visualization of the pancreas. She was prescribed Librax and asked to followup at this time. She states that Librax helps her discomfort. No new GI complaints. Some language barrier issues which are improved with the assistance of her granddaughter. She has had no weight loss in the past month. Apparently, she does have some stress.  REVIEW OF SYSTEMS:  All non-GI ROS negative except for  Past Medical History  Diagnosis Date  . GERD (gastroesophageal reflux disease)     History reviewed. No pertinent past surgical history.  Social History Tami Martin  reports that she has never smoked. She has never used smokeless tobacco. She reports that  drinks alcohol. She reports that she does not use illicit drugs.  family history is not on file.  No Known Allergies     PHYSICAL EXAMINATION: Vital signs: BP 104/70  Pulse 84  Ht 5\' 7"  (1.702 m)  Wt 178 lb 8 oz (80.967 kg)  BMI 27.95 kg/m2 General: Well-developed, well-nourished, no acute distress HEENT: Sclerae are anicteric, conjunctiva pink. Oral mucosa intact Lungs: Clear Heart: Regular Abdomen: soft, nontender, nondistended, no obvious ascites, no peritoneal signs, normal bowel sounds. No organomegaly. Extremities: No edema Psychiatric: alert and oriented x3. Cooperative   ASSESSMENT:  #1. Functional abdominal pain   PLAN:  #1. Continue Librax on a when necessary  basis. Advised with regards to potential side effects including drowsiness and dry mouth. #2. Resume general medical care with her PCP

## 2013-06-04 NOTE — Patient Instructions (Addendum)
We have sent the following medications to your pharmacy for you to pick up at your convenience:  Librax  Please follow up with Dr. Marina Goodell as needed

## 2013-06-09 ENCOUNTER — Ambulatory Visit: Payer: No Typology Code available for payment source | Admitting: Internal Medicine

## 2013-06-10 ENCOUNTER — Encounter: Payer: Self-pay | Admitting: Internal Medicine

## 2013-06-10 ENCOUNTER — Ambulatory Visit (INDEPENDENT_AMBULATORY_CARE_PROVIDER_SITE_OTHER): Payer: No Typology Code available for payment source | Admitting: Internal Medicine

## 2013-06-10 ENCOUNTER — Ambulatory Visit: Payer: No Typology Code available for payment source | Admitting: Internal Medicine

## 2013-06-10 VITALS — BP 112/82 | HR 75 | Temp 98.1°F | Ht 67.0 in | Wt 179.0 lb

## 2013-06-10 DIAGNOSIS — M5430 Sciatica, unspecified side: Secondary | ICD-10-CM

## 2013-06-10 DIAGNOSIS — R6889 Other general symptoms and signs: Secondary | ICD-10-CM

## 2013-06-10 DIAGNOSIS — J309 Allergic rhinitis, unspecified: Secondary | ICD-10-CM | POA: Insufficient documentation

## 2013-06-10 DIAGNOSIS — H579 Unspecified disorder of eye and adnexa: Secondary | ICD-10-CM

## 2013-06-10 DIAGNOSIS — M5432 Sciatica, left side: Secondary | ICD-10-CM

## 2013-06-10 MED ORDER — OLOPATADINE HCL 0.2 % OP SOLN: 1.0000 [drp] | Freq: Every day | OPHTHALMIC | Status: DC

## 2013-06-10 MED ORDER — PREDNISONE 10 MG PO TABS: ORAL_TABLET | ORAL | Status: DC

## 2013-06-10 MED ORDER — TRAMADOL HCL 50 MG PO TABS: 50.0000 mg | ORAL_TABLET | Freq: Three times a day (TID) | ORAL | Status: DC | PRN

## 2013-06-10 NOTE — Assessment & Plan Note (Signed)
Will repeat steroid taper Will give RX for tramadol as needed for pain Will refer to neurosurgery for further evaluation

## 2013-06-10 NOTE — Assessment & Plan Note (Signed)
Mostly eye symptoms Will start Pataday to see if this helps Please make a visit with a eye doctor

## 2013-06-10 NOTE — Progress Notes (Signed)
Subjective:    Patient ID: Tami Martin, female    DOB: 10-29-1971, 42 y.o.   MRN: [id number]  HPI  Pt presents to the clinic today with c/o back pain in the middle of her lower back that runs down her left leg. This started more than a year ago, and comes and goes.. 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 Xray of the lumbar spine has been done in 2011. 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 She takes Advil every day for the pain. When her back pain flares, the ibuprofen is not enough. She has never had a orthopedic doctor take a look at her back. She also c/o itchy, watery eyes. This started 3 weeks ago. She has not gotten anything in her eye that she is aware of. She does not have seasonal allergies. She has not seen an eye doctor since she has been in the [place]. She denies blurred vision but she does have difficulty seeing small print.  Review of Systems      Past Medical History  Diagnosis Date  . GERD (gastroesophageal reflux disease)     Current Outpatient Prescriptions  Medication Sig Dispense Refill  . Calcium Carbonate-Vit D-Min 1200-1000 MG-UNIT CHEW Chew 1 capsule by mouth daily.      . clidinium-chlordiazePOXIDE (LIBRAX) 2.5-5 MG per capsule Take 1 capsule by mouth 3 (three) times daily before meals.  90 capsule  3  . ibuprofen (ADVIL,MOTRIN) 200 MG tablet Take 200 mg by mouth every 6 (six) hours as needed for pain.      . medroxyPROGESTERone (DEPO-PROVERA) 150 MG/ML injection Inject 150 mg into the muscle every 3 (three) months.       No current facility-administered medications for this visit.    No Known Allergies  History reviewed. No pertinent family history.  History   Social History  . Marital Status: Married    Spouse Name: N/A    Number of Children: 7  . Years of Education: N/A   Occupational History  . HOUSEKEEPING    Social History Main Topics  . Smoking status: Never Smoker   . Smokeless  tobacco: Never Used  . Alcohol Use: Yes     Comment: once month  . Drug Use: No  . Sexually Active: Yes   Other Topics Concern  . Not on file   Social History Narrative   Regular exercise-yes   Caffeine Use-on occasion     Constitutional: Denies fever, malaise, fatigue, headache or abrupt weight changes.  HEENT: Pt reports itchy, watery eyes. Denies eye pain, eye redness, ear pain, ringing in the ears, wax buildup, runny nose, nasal congestion, bloody nose, or sore throat. Respiratory: Denies difficulty breathing, shortness of breath, cough or sputum production.   Musculoskeletal: Pt reports back pain. Denies decrease in range of motion, difficulty with gait, muscle pain or joint pain and swelling.  Skin: Denies redness, rashes, lesions or ulcercations.  Neurological: Pt reports sharp pain shooting down the left leg. Denies dizziness, difficulty with memory, difficulty with speech or problems with balance and coordination.   No other specific complaints in a complete review of systems (except as listed in HPI above).  Objective:   Physical Exam  BP 112/82  Pulse 75  Temp(Src) 98.1 F (36.7 C) (Oral)  Ht 5\' 7"  (1.702 m)  Wt 179 lb (81.194 kg)  BMI 28.03 kg/m2  SpO2 98% Wt Readings from Last 3 Encounters:  06/10/13 179 lb (81.194 kg)  06/04/13 178 lb 8 oz (80.967 kg)  05/07/13 178 lb (80.74 kg)    General: Appears her stated age, well developed, well nourished in NAD. Skin: Warm, dry and intact. No rashes, lesions or ulcerations noted. HEENT: Head: normal shape and size; Eyes: sclera white, no icterus, conjunctiva red, PERRLA and EOMs intact; Ears: Tm's gray and intact, normal light reflex; Nose: mucosa pink and moist, septum midline; Throat/Mouth: Teeth present, mucosa pink and moist, no exudate, lesions or ulcerations noted.   Cardiovascular: Normal rate and rhythm. S1,S2 noted.  No murmur, rubs or gallops noted. No JVD or BLE edema. No carotid bruits  noted. Pulmonary/Chest: Normal effort and positive vesicular breath sounds. No respiratory distress. No wheezes, rales or ronchi noted.  Musculoskeletal: Normal range of motion. No signs of joint swelling. No difficulty with gait. Tenderness to palpation over the lumbar spine. Neurological: Alert and oriented. Cranial nerves II-XII intact. Coordination normal. +DTRs bilaterally. Positive straight leg raise.   BMET    Component Value Date/Time   NA 139 03/04/2013 1044   K 4.4 03/04/2013 1044   CL 107 03/04/2013 1044   CO2 27 03/04/2013 1044   GLUCOSE 106* 03/04/2013 1044   BUN 12 03/04/2013 1044   CREATININE 0.6 03/04/2013 1044   CALCIUM 8.8 03/04/2013 1044    Lipid Panel     Component Value Date/Time   CHOL 129 03/04/2013 1044   TRIG 63.0 03/04/2013 1044   HDL 49.90 03/04/2013 1044   CHOLHDL 3 03/04/2013 1044   VLDL 12.6 03/04/2013 1044   LDLCALC 67 03/04/2013 1044    CBC    Component Value Date/Time   WBC 4.1* 03/04/2013 1044   RBC 4.95 03/04/2013 1044   HGB 14.0 03/04/2013 1044   HCT 41.7 03/04/2013 1044   PLT 264.0 03/04/2013 1044   MCV 84.2 03/04/2013 1044   MCHC 33.5 03/04/2013 1044   RDW 12.8 03/04/2013 1044    Hgb A1C Lab Results  Component Value Date   HGBA1C 5.8 03/04/2013         Assessment & Plan:

## 2013-06-10 NOTE — Patient Instructions (Signed)
Back Exercises These exercises may help you when beginning to rehabilitate your injury. Your symptoms may resolve with or without further involvement from your physician, physical therapist or athletic trainer. While completing these exercises, remember:   Restoring tissue flexibility helps normal motion to return to the joints. This allows healthier, less painful movement and activity.  An effective stretch should be held for at least 30 seconds.  A stretch should never be painful. You should only feel a gentle lengthening or release in the stretched tissue. STRETCH  Extension, Prone on Elbows   Lie on your stomach on the floor, a bed will be too soft. Place your palms about shoulder width apart and at the height of your head.  Place your elbows under your shoulders. If this is too painful, stack pillows under your chest.  Allow your body to relax so that your hips drop lower and make contact more completely with the floor.  Hold this position for __________ seconds.  Slowly return to lying flat on the floor. Repeat __________ times. Complete this exercise __________ times per day.  RANGE OF MOTION  Extension, Prone Press Ups   Lie on your stomach on the floor, a bed will be too soft. Place your palms about shoulder width apart and at the height of your head.  Keeping your back as relaxed as possible, slowly straighten your elbows while keeping your hips on the floor. You may adjust the placement of your hands to maximize your comfort. As you gain motion, your hands will come more underneath your shoulders.  Hold this position __________ seconds.  Slowly return to lying flat on the floor. Repeat __________ times. Complete this exercise __________ times per day.  RANGE OF MOTION- Quadruped, Neutral Spine   Assume a hands and knees position on a firm surface. Keep your hands under your shoulders and your knees under your hips. You may place padding under your knees for comfort.  Drop  your head and point your tail bone toward the ground below you. This will round out your low back like an angry cat. Hold this position for __________ seconds.  Slowly lift your head and release your tail bone so that your back sags into a large arch, like an old horse.  Hold this position for __________ seconds.  Repeat this until you feel limber in your low back.  Now, find your "sweet spot." This will be the most comfortable position somewhere between the two previous positions. This is your neutral spine. Once you have found this position, tense your stomach muscles to support your low back.  Hold this position for __________ seconds. Repeat __________ times. Complete this exercise __________ times per day.  STRETCH  Flexion, Single Knee to Chest   Lie on a firm bed or floor with both legs extended in front of you.  Keeping one leg in contact with the floor, bring your opposite knee to your chest. Hold your leg in place by either grabbing behind your thigh or at your knee.  Pull until you feel a gentle stretch in your low back. Hold __________ seconds.  Slowly release your grasp and repeat the exercise with the opposite side. Repeat __________ times. Complete this exercise __________ times per day.  STRETCH - Hamstrings, Standing  Stand or sit and extend your right / left leg, placing your foot on a chair or foot stool  Keeping a slight arch in your low back and your hips straight forward.  Lead with your chest and   lean forward at the waist until you feel a gentle stretch in the back of your right / left knee or thigh. (When done correctly, this exercise requires leaning only a small distance.)  Hold this position for __________ seconds. Repeat __________ times. Complete this stretch __________ times per day. STRENGTHENING  Deep Abdominals, Pelvic Tilt   Lie on a firm bed or floor. Keeping your legs in front of you, bend your knees so they are both pointed toward the ceiling and  your feet are flat on the floor.  Tense your lower abdominal muscles to press your low back into the floor. This motion will rotate your pelvis so that your tail bone is scooping upwards rather than pointing at your feet or into the floor.  With a gentle tension and even breathing, hold this position for __________ seconds. Repeat __________ times. Complete this exercise __________ times per day.  STRENGTHENING  Abdominals, Crunches   Lie on a firm bed or floor. Keeping your legs in front of you, bend your knees so they are both pointed toward the ceiling and your feet are flat on the floor. Cross your arms over your chest.  Slightly tip your chin down without bending your neck.  Tense your abdominals and slowly lift your trunk high enough to just clear your shoulder blades. Lifting higher can put excessive stress on the low back and does not further strengthen your abdominal muscles.  Control your return to the starting position. Repeat __________ times. Complete this exercise __________ times per day.  STRENGTHENING  Quadruped, Opposite UE/LE Lift   Assume a hands and knees position on a firm surface. Keep your hands under your shoulders and your knees under your hips. You may place padding under your knees for comfort.  Find your neutral spine and gently tense your abdominal muscles so that you can maintain this position. Your shoulders and hips should form a rectangle that is parallel with the floor and is not twisted.  Keeping your trunk steady, lift your right hand no higher than your shoulder and then your left leg no higher than your hip. Make sure you are not holding your breath. Hold this position __________ seconds.  Continuing to keep your abdominal muscles tense and your back steady, slowly return to your starting position. Repeat with the opposite arm and leg. Repeat __________ times. Complete this exercise __________ times per day. Document Released: 12/08/2005 Document  Revised: 02/12/2012 Document Reviewed: 03/04/2009 [employer] Patient Information 2014 [employer].  

## 2013-09-11 ENCOUNTER — Ambulatory Visit: Payer: No Typology Code available for payment source | Admitting: Internal Medicine

## 2013-09-11 ENCOUNTER — Encounter: Payer: Self-pay | Admitting: Internal Medicine

## 2013-09-11 ENCOUNTER — Ambulatory Visit (INDEPENDENT_AMBULATORY_CARE_PROVIDER_SITE_OTHER): Payer: No Typology Code available for payment source | Admitting: Internal Medicine

## 2013-09-11 VITALS — BP 150/90 | HR 74 | Temp 97.8°F | Wt 179.0 lb

## 2013-09-11 DIAGNOSIS — Z23 Encounter for immunization: Secondary | ICD-10-CM

## 2013-09-11 DIAGNOSIS — M5432 Sciatica, left side: Secondary | ICD-10-CM

## 2013-09-11 DIAGNOSIS — I10 Essential (primary) hypertension: Secondary | ICD-10-CM | POA: Insufficient documentation

## 2013-09-11 DIAGNOSIS — M5430 Sciatica, unspecified side: Secondary | ICD-10-CM

## 2013-09-11 MED ORDER — LISINOPRIL 10 MG PO TABS: 10.0000 mg | ORAL_TABLET | Freq: Every day | ORAL | Status: DC

## 2013-09-11 MED ORDER — TRAMADOL HCL 50 MG PO TABS: 50.0000 mg | ORAL_TABLET | Freq: Three times a day (TID) | ORAL | Status: DC | PRN

## 2013-09-11 NOTE — Progress Notes (Signed)
Subjective:    Patient ID: Tami Martin, female    DOB: 1971/01/15, 42 y.o.   MRN: [id number]  HPI  Pt presents to the clinic today with c/o HTN.  Review of Systems      Past Medical History  Diagnosis Date  . GERD (gastroesophageal reflux disease)     Current Outpatient Prescriptions  Medication Sig Dispense Refill  . Calcium Carbonate-Vit D-Min 1200-1000 MG-UNIT CHEW Chew 1 capsule by mouth daily.      . clidinium-chlordiazePOXIDE (LIBRAX) 2.5-5 MG per capsule Take 1 capsule by mouth 3 (three) times daily before meals.  90 capsule  3  . ibuprofen (ADVIL,MOTRIN) 200 MG tablet Take 200 mg by mouth every 6 (six) hours as needed for pain.      . medroxyPROGESTERone (DEPO-PROVERA) 150 MG/ML injection Inject 150 mg into the muscle every 3 (three) months.      . Olopatadine HCl 0.2 % SOLN Apply 1 drop to eye daily.  1 Bottle  0  . predniSONE (DELTASONE) 10 MG tablet Take 3 tablets on days 1-3, take 2 tablets on days 4-6, take 1 tablet on days 7-9  18 tablet  0  . traMADol (ULTRAM) 50 MG tablet Take 1 tablet (50 mg total) by mouth every 8 (eight) hours as needed for pain.  60 tablet  0   No current facility-administered medications for this visit.    No Known Allergies  History reviewed. No pertinent family history.  History   Social History  . Marital Status: Married    Spouse Name: N/A    Number of Children: 7  . Years of Education: N/A   Occupational History  . HOUSEKEEPING    Social History Main Topics  . Smoking status: Never Smoker   . Smokeless tobacco: Never Used  . Alcohol Use: Yes     Comment: once month  . Drug Use: No  . Sexual Activity: Yes   Other Topics Concern  . Not on file   Social History Narrative   Regular exercise-yes   Caffeine Use-on occasion     Constitutional: Pt reports headache. Denies fever, malaise, fatigue, or abrupt weight changes.  Respiratory: Denies difficulty breathing, shortness of breath, cough or sputum  production.   Cardiovascular: Denies chest pain, chest tightness, palpitations or swelling in the hands or feet.  Neurological: Denies dizziness, difficulty with memory, difficulty with speech or problems with balance and coordination.   No other specific complaints in a complete review of systems (except as listed in HPI above).  Objective:   Physical Exam   BP 150/90  Pulse 74  Temp(Src) 97.8 F (36.6 C) (Oral)  Wt 179 lb (81.194 kg)  BMI 28.03 kg/m2  SpO2 97% Wt Readings from Last 3 Encounters:  09/11/13 179 lb (81.194 kg)  06/10/13 179 lb (81.194 kg)  06/04/13 178 lb 8 oz (80.967 kg)    General: Appears her stated age, well developed, well nourished in NAD.  Cardiovascular: Normal rate and rhythm. S1,S2 noted.  No murmur, rubs or gallops noted. No JVD or BLE edema. No carotid bruits noted. Pulmonary/Chest: Normal effort and positive vesicular breath sounds. No respiratory distress. No wheezes, rales or ronchi noted.  Neurological: Alert and oriented. Cranial nerves II-XII intact. Coordination normal. +DTRs bilaterally.   BMET    Component Value Date/Time   NA 139 03/04/2013 1044   K 4.4 03/04/2013 1044   CL 107 03/04/2013 1044   CO2 27 03/04/2013 1044   GLUCOSE 106* 03/04/2013 1044  BUN 12 03/04/2013 1044   CREATININE 0.6 03/04/2013 1044   CALCIUM 8.8 03/04/2013 1044    Lipid Panel     Component Value Date/Time   CHOL 129 03/04/2013 1044   TRIG 63.0 03/04/2013 1044   HDL 49.90 03/04/2013 1044   CHOLHDL 3 03/04/2013 1044   VLDL 12.6 03/04/2013 1044   LDLCALC 67 03/04/2013 1044    CBC    Component Value Date/Time   WBC 4.1* 03/04/2013 1044   RBC 4.95 03/04/2013 1044   HGB 14.0 03/04/2013 1044   HCT 41.7 03/04/2013 1044   PLT 264.0 03/04/2013 1044   MCV 84.2 03/04/2013 1044   MCHC 33.5 03/04/2013 1044   RDW 12.8 03/04/2013 1044    Hgb A1C Lab Results  Component Value Date   HGBA1C 5.8 03/04/2013         Assessment & Plan:

## 2013-09-11 NOTE — Assessment & Plan Note (Signed)
New onset Will start lisinopril 10 mg daily  RTC in 2 weeks for nurse visit

## 2013-09-11 NOTE — Patient Instructions (Signed)

## 2013-09-11 NOTE — Addendum Note (Signed)
Addended by: Darnell Level on: 09/11/2013 10:39 AM   Modules accepted: Orders

## 2013-09-11 NOTE — Assessment & Plan Note (Signed)
Refill tramadol

## 2013-09-25 ENCOUNTER — Ambulatory Visit: Payer: 59

## 2013-09-25 VITALS — BP 124/82

## 2013-09-25 DIAGNOSIS — Z0130 Encounter for examination of blood pressure without abnormal findings: Secondary | ICD-10-CM

## 2014-02-09 ENCOUNTER — Other Ambulatory Visit: Payer: Self-pay | Admitting: Internal Medicine

## 2014-02-09 DIAGNOSIS — Z1231 Encounter for screening mammogram for malignant neoplasm of breast: Secondary | ICD-10-CM

## 2014-03-16 ENCOUNTER — Ambulatory Visit (HOSPITAL_COMMUNITY)
Admission: RE | Admit: 2014-03-16 | Discharge: 2014-03-16 | Disposition: A | Discharge status: Home / Self Care | Payer: 59 | Source: Ambulatory Visit | Attending: Internal Medicine | Admitting: Internal Medicine

## 2014-03-16 DIAGNOSIS — Z1231 Encounter for screening mammogram for malignant neoplasm of breast: Secondary | ICD-10-CM | POA: Insufficient documentation

## 2014-06-01 ENCOUNTER — Ambulatory Visit: Payer: No Typology Code available for payment source | Admitting: Family Medicine

## 2014-06-12 ENCOUNTER — Encounter: Payer: Self-pay | Admitting: Internal Medicine

## 2014-06-12 ENCOUNTER — Ambulatory Visit (INDEPENDENT_AMBULATORY_CARE_PROVIDER_SITE_OTHER): Payer: 59 | Admitting: Internal Medicine

## 2014-06-12 VITALS — BP 112/76 | HR 74 | Temp 98.0°F | Wt 174.0 lb

## 2014-06-12 DIAGNOSIS — M5430 Sciatica, unspecified side: Secondary | ICD-10-CM

## 2014-06-12 DIAGNOSIS — H579 Unspecified disorder of eye and adnexa: Secondary | ICD-10-CM

## 2014-06-12 DIAGNOSIS — R6889 Other general symptoms and signs: Secondary | ICD-10-CM

## 2014-06-12 DIAGNOSIS — K219 Gastro-esophageal reflux disease without esophagitis: Secondary | ICD-10-CM

## 2014-06-12 DIAGNOSIS — M5432 Sciatica, left side: Secondary | ICD-10-CM

## 2014-06-12 MED ORDER — OLOPATADINE HCL 0.2 % OP SOLN: 1.0000 [drp] | Freq: Every day | OPHTHALMIC | Status: DC

## 2014-06-12 MED ORDER — PREDNISONE 10 MG PO TABS: ORAL_TABLET | ORAL | Status: DC

## 2014-06-12 MED ORDER — TRAMADOL HCL 50 MG PO TABS: 50.0000 mg | ORAL_TABLET | Freq: Three times a day (TID) | ORAL | Status: DC | PRN

## 2014-06-12 MED ORDER — ESOMEPRAZOLE MAGNESIUM 40 MG PO CPDR: 40.0000 mg | DELAYED_RELEASE_CAPSULE | Freq: Every day | ORAL | Status: DC

## 2014-06-12 NOTE — Progress Notes (Signed)
Pre visit review using our clinic review tool, if applicable. No additional management support is needed unless otherwise documented below in the visit note. 

## 2014-06-12 NOTE — Patient Instructions (Addendum)
Back Exercises These exercises may help you when beginning to rehabilitate your injury. Your symptoms may resolve with or without further involvement from your physician, physical therapist or athletic trainer. While completing these exercises, remember:   Restoring tissue flexibility helps normal motion to return to the joints. This allows healthier, less painful movement and activity.  An effective stretch should be held for at least 30 seconds.  A stretch should never be painful. You should only feel a gentle lengthening or release in the stretched tissue. STRETCH - Extension, Prone on Elbows   Lie on your stomach on the floor, a bed will be too soft. Place your palms about shoulder width apart and at the height of your head.  Place your elbows under your shoulders. If this is too painful, stack pillows under your chest.  Allow your body to relax so that your hips drop lower and make contact more completely with the floor.  Hold this position for __________ seconds.  Slowly return to lying flat on the floor. Repeat __________ times. Complete this exercise __________ times per day.  RANGE OF MOTION - Extension, Prone Press Ups   Lie on your stomach on the floor, a bed will be too soft. Place your palms about shoulder width apart and at the height of your head.  Keeping your back as relaxed as possible, slowly straighten your elbows while keeping your hips on the floor. You may adjust the placement of your hands to maximize your comfort. As you gain motion, your hands will come more underneath your shoulders.  Hold this position __________ seconds.  Slowly return to lying flat on the floor. Repeat __________ times. Complete this exercise __________ times per day.  RANGE OF MOTION- Quadruped, Neutral Spine   Assume a hands and knees position on a firm surface. Keep your hands under your shoulders and your knees under your hips. You may place padding under your knees for  comfort.  Drop your head and point your tail bone toward the ground below you. This will round out your low back like an angry cat. Hold this position for __________ seconds.  Slowly lift your head and release your tail bone so that your back sags into a large arch, like an old horse.  Hold this position for __________ seconds.  Repeat this until you feel limber in your low back.  Now, find your "sweet spot." This will be the most comfortable position somewhere between the two previous positions. This is your neutral spine. Once you have found this position, tense your stomach muscles to support your low back.  Hold this position for __________ seconds. Repeat __________ times. Complete this exercise __________ times per day.  STRETCH - Flexion, Single Knee to Chest   Lie on a firm bed or floor with both legs extended in front of you.  Keeping one leg in contact with the floor, bring your opposite knee to your chest. Hold your leg in place by either grabbing behind your thigh or at your knee.  Pull until you feel a gentle stretch in your low back. Hold __________ seconds.  Slowly release your grasp and repeat the exercise with the opposite side. Repeat __________ times. Complete this exercise __________ times per day.  STRETCH - Hamstrings, Standing  Stand or sit and extend your right / left leg, placing your foot on a chair or foot stool  Keeping a slight arch in your low back and your hips straight forward.  Lead with your chest and   lean forward at the waist until you feel a gentle stretch in the back of your right / left knee or thigh. (When done correctly, this exercise requires leaning only a small distance.)  Hold this position for __________ seconds. Repeat __________ times. Complete this stretch __________ times per day. STRENGTHENING - Deep Abdominals, Pelvic Tilt   Lie on a firm bed or floor. Keeping your legs in front of you, bend your knees so they are both pointed  toward the ceiling and your feet are flat on the floor.  Tense your lower abdominal muscles to press your low back into the floor. This motion will rotate your pelvis so that your tail bone is scooping upwards rather than pointing at your feet or into the floor.  With a gentle tension and even breathing, hold this position for __________ seconds. Repeat __________ times. Complete this exercise __________ times per day.  STRENGTHENING - Abdominals, Crunches   Lie on a firm bed or floor. Keeping your legs in front of you, bend your knees so they are both pointed toward the ceiling and your feet are flat on the floor. Cross your arms over your chest.  Slightly tip your chin down without bending your neck.  Tense your abdominals and slowly lift your trunk high enough to just clear your shoulder blades. Lifting higher can put excessive stress on the low back and does not further strengthen your abdominal muscles.  Control your return to the starting position. Repeat __________ times. Complete this exercise __________ times per day.  STRENGTHENING - Quadruped, Opposite UE/LE Lift   Assume a hands and knees position on a firm surface. Keep your hands under your shoulders and your knees under your hips. You may place padding under your knees for comfort.  Find your neutral spine and gently tense your abdominal muscles so that you can maintain this position. Your shoulders and hips should form a rectangle that is parallel with the floor and is not twisted.  Keeping your trunk steady, lift your right hand no higher than your shoulder and then your left leg no higher than your hip. Make sure you are not holding your breath. Hold this position __________ seconds.  Continuing to keep your abdominal muscles tense and your back steady, slowly return to your starting position. Repeat with the opposite arm and leg. Repeat __________ times. Complete this exercise __________ times per day. Document Released:  12/08/2005 Document Revised: 02/12/2012 Document Reviewed: 03/04/2009 [employer] Patient Information 2015 [employer]. This information is not intended to replace advice given to you by your health care provider. Make sure you discuss any questions you have with your health care provider.  

## 2014-06-12 NOTE — Progress Notes (Signed)
Subjective:    Patient ID: Tami Martin, female    DOB: 07/19/1971, 43 y.o.   MRN: [id number]  HPI  Pt presents to the clinic today with c/o back pain. This started 2 months ago. The pain is in the mid lower back and radiates to the left leg. She describes the pain as sharp and shooting. She was taking tramadol for the pain but has ran out. She has had a history of sciatica in the past and reports this feels the same.  Additionally, she reports eye itching and burning. This started about 2 weeks ago. She also has associated headaches. She denies fever, chills or body aches. She does have a history of allergies but is not taking anything OTC. She was taking eye drops but that prescription has run out.   She also c/o chest discomfort. She reports it starts in her upper abdomen usually after she eats. The pain radiates up to the center of her chest and up into her throat. She denies shortness of breat or cough. She has not tried anything OTC for it.  Review of Systems      Past Medical History  Diagnosis Date  . GERD (gastroesophageal reflux disease)     Current Outpatient Prescriptions  Medication Sig Dispense Refill  . Calcium Carbonate-Vit D-Min 1200-1000 MG-UNIT CHEW Chew 1 capsule by mouth daily.      . clidinium-chlordiazePOXIDE (LIBRAX) 2.5-5 MG per capsule Take 1 capsule by mouth 3 (three) times daily before meals.  90 capsule  3  . ibuprofen (ADVIL,MOTRIN) 200 MG tablet Take 200 mg by mouth every 6 (six) hours as needed for pain.      Marland Kitchen. lisinopril (PRINIVIL,ZESTRIL) 10 MG tablet Take 1 tablet (10 mg total) by mouth daily.  30 tablet  0  . medroxyPROGESTERone (DEPO-PROVERA) 150 MG/ML injection Inject 150 mg into the muscle every 3 (three) months.      . Olopatadine HCl 0.2 % SOLN Apply 1 drop to eye daily.  1 Bottle  0  . traMADol (ULTRAM) 50 MG tablet Take 1 tablet (50 mg total) by mouth every 8 (eight) hours as needed for pain.  60 tablet  0   No current  facility-administered medications for this visit.    No Known Allergies  No family history on file.  History   Social History  . Marital Status: Married    Spouse Name: N/A    Number of Children: 7  . Years of Education: N/A   Occupational History  . HOUSEKEEPING    Social History Main Topics  . Smoking status: Never Smoker   . Smokeless tobacco: Never Used  . Alcohol Use: Yes     Comment: once month  . Drug Use: No  . Sexual Activity: Yes   Other Topics Concern  . Not on file   Social History Narrative   Regular exercise-yes   Caffeine Use-on occasion     Constitutional: Pt reports headache. Denies fever, malaise, fatigue, or abrupt weight changes.  HEENT: Pt reports eye pain. Denies eye redness, ear pain, ringing in the ears, wax buildup, runny nose, nasal congestion, bloody nose, or sore throat. Respiratory: Denies difficulty breathing, shortness of breath, cough or sputum production.   Cardiovascular: Pt reports chest pain. Denies chest tightness, palpitations or swelling in the hands or feet.  Gastrointestinal: Pt reports reflux. Denies bloating, constipation, diarrhea or blood in the stool.  Musculoskeletal: Pt reports back pain. Denies decrease in range of motion, difficulty with gait,  muscle pain or joint pain and swelling.   No other specific complaints in a complete review of systems (except as listed in HPI above).  Objective:   Physical Exam   BP 112/76  Pulse 74  Temp(Src) 98 F (36.7 C) (Oral)  Wt 174 lb (78.926 kg)  SpO2 98% Wt Readings from Last 3 Encounters:  06/12/14 174 lb (78.926 kg)  09/11/13 179 lb (81.194 kg)  06/10/13 179 lb (81.194 kg)    General: Appears her stated age, well developed, well nourished in NAD. HEENT: Head: normal shape and size; Eyes: sclera injected, no icterus, conjunctiva pink, PERRLA and EOMs intact; Ears: Tm's gray and intact, normal light reflex; Nose: mucosa pink and moist, septum midline; Throat/Mouth: Teeth  present, mucosa pink and moist, no exudate, lesions or ulcerations noted.  Cardiovascular: Normal rate and rhythm. S1,S2 noted.  No murmur, rubs or gallops noted. No JVD or BLE edema. No carotid bruits noted. Pulmonary/Chest: Normal effort and positive vesicular breath sounds. No respiratory distress. No wheezes, rales or ronchi noted.  Abdomen: Soft and tender in the epigastric area. Normal bowel sounds, no bruits noted. No distention or masses noted. Liver, spleen and kidneys non palpable. Musculoskeletal: Normal flexion and extension of the spine. Pain with palpation of the lumbar area. Strength 5/5 BLE. Positive straight leg raise on the left.    BMET    Component Value Date/Time   NA 139 03/04/2013 1044   K 4.4 03/04/2013 1044   CL 107 03/04/2013 1044   CO2 27 03/04/2013 1044   GLUCOSE 106* 03/04/2013 1044   BUN 12 03/04/2013 1044   CREATININE 0.6 03/04/2013 1044   CALCIUM 8.8 03/04/2013 1044    Lipid Panel     Component Value Date/Time   CHOL 129 03/04/2013 1044   TRIG 63.0 03/04/2013 1044   HDL 49.90 03/04/2013 1044   CHOLHDL 3 03/04/2013 1044   VLDL 12.6 03/04/2013 1044   LDLCALC 67 03/04/2013 1044    CBC    Component Value Date/Time   WBC 4.1* 03/04/2013 1044   RBC 4.95 03/04/2013 1044   HGB 14.0 03/04/2013 1044   HCT 41.7 03/04/2013 1044   PLT 264.0 03/04/2013 1044   MCV 84.2 03/04/2013 1044   MCHC 33.5 03/04/2013 1044   RDW 12.8 03/04/2013 1044    Hgb A1C Lab Results  Component Value Date   HGBA1C 5.8 03/04/2013        Assessment & Plan:   Sciatica Neuralgia, left:  Stretching exercises given eRx for pred taper RX for tramadol to use after prednisone is finished If no improvement, will refer to PT  Reflux:  Start Nexium- RX provided Diet information provided  Allergies:  Refilled her eye drops She does not like to take antihistamines OTC  RTC as needed or if symptoms persist or worsen

## 2014-09-01 ENCOUNTER — Ambulatory Visit: Payer: 59 | Admitting: Internal Medicine

## 2014-09-11 ENCOUNTER — Other Ambulatory Visit (INDEPENDENT_AMBULATORY_CARE_PROVIDER_SITE_OTHER): Payer: 59

## 2014-09-11 ENCOUNTER — Encounter: Payer: Self-pay | Admitting: Internal Medicine

## 2014-09-11 ENCOUNTER — Ambulatory Visit (INDEPENDENT_AMBULATORY_CARE_PROVIDER_SITE_OTHER): Payer: 59 | Admitting: Internal Medicine

## 2014-09-11 VITALS — BP 132/72 | HR 90 | Temp 98.5°F | Resp 14 | Ht 67.0 in | Wt 178.8 lb

## 2014-09-11 DIAGNOSIS — K219 Gastro-esophageal reflux disease without esophagitis: Secondary | ICD-10-CM

## 2014-09-11 DIAGNOSIS — Z0001 Encounter for general adult medical examination with abnormal findings: Secondary | ICD-10-CM | POA: Insufficient documentation

## 2014-09-11 DIAGNOSIS — R5383 Other fatigue: Secondary | ICD-10-CM

## 2014-09-11 DIAGNOSIS — M5432 Sciatica, left side: Secondary | ICD-10-CM

## 2014-09-11 DIAGNOSIS — Z23 Encounter for immunization: Secondary | ICD-10-CM

## 2014-09-11 DIAGNOSIS — Z0000 Encounter for general adult medical examination without abnormal findings: Secondary | ICD-10-CM | POA: Insufficient documentation

## 2014-09-11 DIAGNOSIS — J302 Other seasonal allergic rhinitis: Secondary | ICD-10-CM

## 2014-09-11 DIAGNOSIS — I10 Essential (primary) hypertension: Secondary | ICD-10-CM

## 2014-09-11 LAB — BASIC METABOLIC PANEL
BUN: 10 mg/dL (ref 6–23)
CHLORIDE: 109 meq/L (ref 96–112)
CO2: 23 mEq/L (ref 19–32)
Calcium: 8.9 mg/dL (ref 8.4–10.5)
Creatinine, Ser: 0.6 mg/dL (ref 0.4–1.2)
GFR: 134.62 mL/min (ref >60.00)
Glucose, Bld: 138 mg/dL — ABNORMAL HIGH (ref 70–99)
POTASSIUM: 3.5 meq/L (ref 3.5–5.1)
Sodium: 137 mEq/L (ref 135–145)

## 2014-09-11 LAB — TSH: TSH: 0.83 u[IU]/mL (ref 0.35–4.50)

## 2014-09-11 MED ORDER — TRAMADOL HCL 50 MG PO TABS: 50.0000 mg | ORAL_TABLET | Freq: Three times a day (TID) | ORAL | Status: DC | PRN

## 2014-09-11 MED ORDER — ESOMEPRAZOLE MAGNESIUM 40 MG PO CPDR: 40.0000 mg | DELAYED_RELEASE_CAPSULE | Freq: Every day | ORAL | Status: DC

## 2014-09-11 NOTE — Progress Notes (Signed)
   Subjective:    Patient ID: Tami Martin, female    DOB: 12/30/1970, 43 y.o.   MRN: [id number]  HPI The patient is a 43 year old female comes in today to establish care. She has past medical history of GERD, back pain with sciatica. Her English is fairly poor and she is unable to really describe what or how many medicines she is taking. She states that she is having burning in her esophagus with meals and it does not really matter what meal she eats she gets the burning. She is feeling tired sometimes and delivers a little bit of stress in her life right now so she is feeling a little bit anxious. She does get some drainage and is having a little bit of sore throat now due to drainage. She denies fevers, chills, sinus pressure, headache, decreased hearing, ear pain. She denies chest pains, shortness of breath, constipation, diarrhea. She is having some dizziness however is unable to describe how and when she gets the dizziness.  Review of Systems  Constitutional: Negative for fever, chills, activity change, appetite change and fatigue.  HENT: Positive for congestion and sore throat. Negative for ear discharge, ear pain, postnasal drip, rhinorrhea and sinus pressure.   Eyes: Negative.   Respiratory: Negative for cough, chest tightness, shortness of breath and wheezing.   Cardiovascular: Negative for chest pain, palpitations and leg swelling.  Gastrointestinal: Negative for abdominal pain, diarrhea, constipation and abdominal distention.       Burning with eating  Endocrine: Negative.   Musculoskeletal: Positive for back pain. Negative for gait problem and myalgias.  Skin: Negative.   Neurological: Positive for dizziness and light-headedness. Negative for weakness and headaches.  Psychiatric/Behavioral: The patient is nervous/anxious.       Objective:   Physical Exam  Vitals reviewed. Constitutional: She is oriented to person, place, and time. She appears well-developed and  well-nourished. No distress.  HENT:  Head: Normocephalic and atraumatic.  Nose: Nose normal.  Mouth/Throat: Oropharynx is clear and moist.  Eyes: EOM are normal.  Neck: Normal range of motion.  Cardiovascular: Normal rate and regular rhythm.   Pulmonary/Chest: Effort normal and breath sounds normal. No respiratory distress. She has no wheezes. She has no rales.  Abdominal: Soft. She exhibits no distension. There is no tenderness. There is no rebound.  Neurological: She is alert and oriented to person, place, and time. Coordination normal.  Skin: Skin is warm and dry.   Filed Vitals:   09/11/14 1106  BP: 132/72  Pulse: 90  Temp: 98.5 F (36.9 C)  TempSrc: Oral  Resp: 14  Height: 5\' 7"  (1.702 m)  Weight: 178 lb 12.8 oz (81.103 kg)  SpO2: 98%      Assessment & Plan:  Flu shot given today.

## 2014-09-11 NOTE — Assessment & Plan Note (Signed)
Refill given for tramadol #60 no refills. Last filled in July. Referral for orthopedic surgery for her back.

## 2014-09-11 NOTE — Assessment & Plan Note (Signed)
Unclear she does have hypertension at this time. Normotensive today presumably off medication. Will monitor and especially given dizziness advise her to not take lisinopril if she does have any left at home.

## 2014-09-11 NOTE — Progress Notes (Signed)
Pre visit review using our clinic review tool, if applicable. No additional management support is needed unless otherwise documented below in the visit note. 

## 2014-09-11 NOTE — Assessment & Plan Note (Signed)
Advised her that the sore throat and dripping is likely allergies. Given her confusion with medications at this time have advised her since her symptoms are mild to not take any medication for this problem currently.

## 2014-09-11 NOTE — Assessment & Plan Note (Signed)
Due to patient's confusion about medications I did call the pharmacy personally to verify for her medication list. She is not taking any medications the only medicine she has filled his Nexium 40 mg last filled in August. She has also filled tramadol in July for 60 pills. No other medications were filled within the last year. Lisinopril was filled 1 year ago for one-month supply and was not continued. She was advised to only take Nexium once daily for her burning, tramadol one pill up to 3 times daily for back pain. She was advised to not take any other medications. Will check basic metabolic panel, TSH today. Unclear if some of her problems are related to medication side effect from things that she should no longer be taking.

## 2014-09-11 NOTE — Patient Instructions (Signed)
We will check some blood work today.  We would like to stop some of your medicines that may not be helping you.  Take nexium 1 pill per day for your stomach pains and the burning you are having.  Take tramadol for back pain. You can take 1 pill 2 times per day.   Come back in about 6 months to check in, we will send you to the back doctor for your pain.

## 2014-11-30 ENCOUNTER — Other Ambulatory Visit: Payer: Self-pay | Admitting: Internal Medicine

## 2014-12-04 ENCOUNTER — Other Ambulatory Visit: Payer: Self-pay | Admitting: Internal Medicine

## 2015-01-22 ENCOUNTER — Encounter: Payer: Self-pay | Admitting: Internal Medicine

## 2015-01-22 ENCOUNTER — Ambulatory Visit (INDEPENDENT_AMBULATORY_CARE_PROVIDER_SITE_OTHER): Payer: 59 | Admitting: Internal Medicine

## 2015-01-22 VITALS — BP 116/72 | HR 80 | Temp 97.7°F | Resp 16 | Ht 67.0 in | Wt 186.0 lb

## 2015-01-22 DIAGNOSIS — M5432 Sciatica, left side: Secondary | ICD-10-CM

## 2015-01-22 DIAGNOSIS — R51 Headache: Secondary | ICD-10-CM

## 2015-01-22 DIAGNOSIS — R519 Headache, unspecified: Secondary | ICD-10-CM | POA: Insufficient documentation

## 2015-01-22 MED ORDER — TRAMADOL HCL 50 MG PO TABS: 50.0000 mg | ORAL_TABLET | Freq: Three times a day (TID) | ORAL | Status: DC | PRN

## 2015-01-22 MED ORDER — FLUCONAZOLE 150 MG PO TABS: 150.0000 mg | ORAL_TABLET | Freq: Once | ORAL | Status: DC

## 2015-01-22 MED ORDER — SUMATRIPTAN SUCCINATE 100 MG PO TABS: 100.0000 mg | ORAL_TABLET | ORAL | Status: DC | PRN

## 2015-01-22 NOTE — Patient Instructions (Addendum)
1. We have given you a headache medicine called sumatriptan. You can take 1 pill for headache and if it does not work you can take another one in 2 hours. We will try to get rid of the headache and see if it does better.   2. We have also refilled your medicine for the back pain. When you are able please go back to the back doctor.   3. We have sent in a medicine for the white discharge called diflucan which you take one time only and then it is done.   4. If you are still having problems please call us back.

## 2015-01-22 NOTE — Progress Notes (Signed)
   Subjective:    Patient ID: Tami Martin, female    DOB: 10/10/1971, 44 y.o.   MRN: [id number]  HPI The patient is a 44 YO female who is coming in for headache. This is a new problem which started about 1 week ago. She has tried ibuprofen which helps for a little while then the headache comes back. She is having neck pain which radiates into her head. She also has associated nausea, double vision. She likely needs glasses and wants a referral to get her eyes checked as she has been having problems with them for a long time. She denies fevers, chills, confusion. She has not been around anyone sick. She gets headaches occasionally in the past but she is not able to well describe them.   Review of Systems  Constitutional: Negative for fever, activity change, appetite change, fatigue and unexpected weight change.  Eyes: Positive for photophobia, pain, itching and visual disturbance.  Respiratory: Negative for cough, chest tightness, shortness of breath and wheezing.   Cardiovascular: Negative for chest pain, palpitations and leg swelling.  Gastrointestinal: Positive for nausea. Negative for abdominal pain, diarrhea, constipation and abdominal distention.  Musculoskeletal: Positive for back pain and neck pain. Negative for myalgias.  Skin: Negative.   Neurological: Positive for headaches. Negative for dizziness, syncope, speech difficulty, weakness, light-headedness and numbness.      Objective:   Physical Exam  Constitutional: She is oriented to person, place, and time. She appears well-developed and well-nourished. No distress.  HENT:  Head: Normocephalic and atraumatic.  Nose: Nose normal.  Mouth/Throat: Oropharynx is clear and moist.  Eyes: EOM are normal.  Neck: Normal range of motion.  Cardiovascular: Normal rate and regular rhythm.   Pulmonary/Chest: Effort normal and breath sounds normal. No respiratory distress. She has no wheezes. She has no rales.  Abdominal: Soft. She  exhibits no distension. There is no tenderness. There is no rebound.  Musculoskeletal: She exhibits tenderness.  Tenderness in the upper neck with radiates into the head  Neurological: She is alert and oriented to person, place, and time. Coordination normal.  Skin: Skin is warm and dry.  Psychiatric: She has a normal mood and affect. Her behavior is normal.  Vitals reviewed.  Filed Vitals:   01/22/15 0821  BP: 116/72  Pulse: 80  Temp: 97.7 F (36.5 C)  TempSrc: Oral  Resp: 16  Height: 5\' 7"  (1.702 m)  Weight: 186 lb (84.369 kg)  SpO2: 98%      Assessment & Plan:

## 2015-01-22 NOTE — Assessment & Plan Note (Signed)
Appears to have migraine features. Will try sumatriptan to stop the migraine. If she has more episodes will likely need MRI brain as she does not have history of migraines. Unclear if related to her depo-provera. She was not sure how long she was on. She does endorse past headaches but cannot tell many details. The patient is a poor historian. Referral placed for eye exam as she has been having problems with her vision for some time and it is hard to tell what problems are acute and chronic with her vision. Not consistent with meningitis (no fevers, stiffness). No acute injury, weight loss that would suggest need for emergent imaging.

## 2015-01-22 NOTE — Progress Notes (Signed)
Pre visit review using our clinic review tool, if applicable. No additional management support is needed unless otherwise documented below in the visit note. 

## 2015-02-12 ENCOUNTER — Encounter: Payer: Self-pay | Admitting: Internal Medicine

## 2015-04-15 ENCOUNTER — Other Ambulatory Visit: Payer: Self-pay | Admitting: Internal Medicine

## 2015-04-15 DIAGNOSIS — Z1231 Encounter for screening mammogram for malignant neoplasm of breast: Secondary | ICD-10-CM

## 2015-04-23 ENCOUNTER — Ambulatory Visit (HOSPITAL_COMMUNITY)
Admission: RE | Admit: 2015-04-23 | Discharge: 2015-04-23 | Disposition: A | Discharge status: Home / Self Care | Payer: 59 | Source: Ambulatory Visit | Attending: Internal Medicine | Admitting: Internal Medicine

## 2015-04-23 DIAGNOSIS — Z1231 Encounter for screening mammogram for malignant neoplasm of breast: Secondary | ICD-10-CM | POA: Insufficient documentation

## 2016-07-19 ENCOUNTER — Encounter: Payer: Self-pay | Admitting: Internal Medicine

## 2016-07-19 ENCOUNTER — Ambulatory Visit (INDEPENDENT_AMBULATORY_CARE_PROVIDER_SITE_OTHER): Payer: Self-pay | Admitting: Internal Medicine

## 2016-07-19 VITALS — BP 114/78 | HR 85 | Temp 98.6°F | Resp 16 | Ht 66.0 in | Wt 193.0 lb

## 2016-07-19 DIAGNOSIS — M533 Sacrococcygeal disorders, not elsewhere classified: Secondary | ICD-10-CM

## 2016-07-19 DIAGNOSIS — Z1239 Encounter for other screening for malignant neoplasm of breast: Secondary | ICD-10-CM

## 2016-07-19 MED ORDER — DICLOFENAC SODIUM 75 MG PO TBEC: DELAYED_RELEASE_TABLET | ORAL | 1 refills | Status: DC

## 2016-07-19 NOTE — Patient Instructions (Signed)
Wedge pillow to sit on--cut from foam. Or can get a donut pillow.
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
## 2016-07-20 ENCOUNTER — Encounter: Payer: Self-pay | Admitting: Internal Medicine

## 2016-07-21 ENCOUNTER — Telehealth: Payer: Self-pay | Admitting: Licensed Clinical Social Worker

## 2016-07-21 NOTE — Telephone Encounter (Signed)
LCSW called new pt to check in regarding needs. Pt shared that her new medication was working very well and that she had no other needs at this time.

## 2016-08-15 ENCOUNTER — Ambulatory Visit: Payer: Self-pay | Admitting: Internal Medicine

## 2016-08-16 ENCOUNTER — Other Ambulatory Visit: Payer: Self-pay | Admitting: Internal Medicine

## 2016-08-16 DIAGNOSIS — Z1231 Encounter for screening mammogram for malignant neoplasm of breast: Secondary | ICD-10-CM

## 2016-08-22 ENCOUNTER — Ambulatory Visit
Admission: RE | Admit: 2016-08-22 | Discharge: 2016-08-22 | Disposition: A | Discharge status: Home / Self Care | Payer: No Typology Code available for payment source | Source: Ambulatory Visit | Attending: Internal Medicine | Admitting: Internal Medicine

## 2016-08-22 DIAGNOSIS — Z1231 Encounter for screening mammogram for malignant neoplasm of breast: Secondary | ICD-10-CM

## 2016-08-29 ENCOUNTER — Ambulatory Visit (INDEPENDENT_AMBULATORY_CARE_PROVIDER_SITE_OTHER): Payer: Self-pay | Admitting: Internal Medicine

## 2016-08-29 ENCOUNTER — Encounter: Payer: Self-pay | Admitting: Internal Medicine

## 2016-08-29 VITALS — BP 120/70 | HR 73 | Temp 98.3°F | Resp 16 | Ht 66.25 in | Wt 196.0 lb

## 2016-08-29 DIAGNOSIS — M533 Sacrococcygeal disorders, not elsewhere classified: Secondary | ICD-10-CM

## 2016-08-29 DIAGNOSIS — K219 Gastro-esophageal reflux disease without esophagitis: Secondary | ICD-10-CM

## 2016-08-29 DIAGNOSIS — J302 Other seasonal allergic rhinitis: Secondary | ICD-10-CM

## 2016-08-29 DIAGNOSIS — R7303 Prediabetes: Secondary | ICD-10-CM | POA: Insufficient documentation

## 2016-08-29 DIAGNOSIS — E119 Type 2 diabetes mellitus without complications: Secondary | ICD-10-CM | POA: Insufficient documentation

## 2016-08-29 MED ORDER — FAMOTIDINE 20 MG PO TABS: ORAL_TABLET | ORAL | 6 refills | Status: DC

## 2016-08-29 MED ORDER — CETIRIZINE HCL 10 MG PO TABS: 10.0000 mg | ORAL_TABLET | Freq: Every day | ORAL | 11 refills | Status: DC

## 2016-08-29 MED ORDER — OLOPATADINE HCL 0.2 % OP SOLN: OPHTHALMIC | 11 refills | Status: DC

## 2016-08-29 NOTE — Progress Notes (Signed)
   Subjective:    Patient ID: Tami Martin, female    DOB: 09/30/1971, 45 y.o.   MRN: [id number]  HPI   1.  Coccygeal pain:  Was unable to get a foam wedge.  Did get a donut.  Pain is much better, but still with some discomfort if has to sit for long period of time. Does not know how to swim  2.  Allergies:  Eyes and nose itching and running, sneezing.  Frontal headache every morning.  Throat itches as well.  Started about 2 weeks ago.  Has had before.  Not clear what time of year.  Just used eyedrops before and helped.  3.  GERD:  If hungry or after eats, develops burning in mid epigastrium.  Is still taking Diclofenac for her coccygeal pain.  Nonsmoker, no alcohol.  Does eat a lot of tomatoes.  Onions as well.  She does note her symptoms are worse with these foods, rice as well.  Coffee makes it worse as this makes her symptoms worse.  Does drink lipton tea, caffeinated.  No melena or hematochezia.   She does lie down about 1 hour after eating. Does not have HOB elevated.  4. Prediabetes:  A1C back in 2014 was high at 5.8%.  She states she did not weigh more then. States she was told to exercise more and limit her sugar.  Current Meds  Medication Sig  . Calcium Carbonate-Vit D-Min 1200-1000 MG-UNIT CHEW Chew 1 capsule by mouth daily.  . diclofenac (VOLTAREN) 75 MG EC tablet 1 tab by mouth twice daily with meals  . medroxyPROGESTERone (DEPO-PROVERA) 150 MG/ML injection Inject 150 mg into the muscle every 3 (three) months.  . Multiple Vitamin (MULTIVITAMIN WITH MINERALS) TABS tablet Take 1 tablet by mouth daily.   No Known Allergies     Review of Systems     Objective:   Physical Exam  NAD HEENT:  PERRL, EOMI, mildly injected conjunctivae, watery, Nasal mucosa swollen and boggy with clear discharge, TMs pearly gray.  Throat without injection or cobbling of posterior pharynx Neck:  Supple no adenopathy Chest:  CTA CV:  RRR without murmur or rub, radial and DP, PT  pulse LE:  Feet healthy appearing, good cap refill.         Assessment & Plan:  1.  Prediabetes history;  Check CMP, FLP, A1C today.  2.  Coccygeal pain:  Improved.  To use Diclofenac only as needed now.  To avoid long car rides--get out and walk regularly  3.  GERD:  Start Famotidine 40 mg at bedtime. Reflux precautions, including elevating HOB.  Limit Diclofenac  4.  Allergies:  [facility] and Zyrtec 10 mg daily.   Follow up based on lab results.

## 2016-08-29 NOTE — Patient Instructions (Signed)
Drink a glass of water before every meal Drink 6-8 glasses of water daily Eat three meals daily Eat a protein and healthy fat with every meal (eggs,fish, chicken, turkey and limit red meats) Eat 5 servings of vegetables daily, mix the colors Eat 2 servings of fruit daily with skin, if skin is edible Use smaller plates Put food/utensils down as you chew and swallow each bite Eat at a table with friends/family at least once daily, no TV Do not eat in front of the TV 

## 2016-08-30 LAB — CBC WITH DIFFERENTIAL/PLATELET
BASOS ABS: 0 10*3/uL (ref 0.0–0.2)
BASOS: 1 %
EOS (ABSOLUTE): 0.2 10*3/uL (ref 0.0–0.4)
EOS: 5 %
HEMATOCRIT: 44.7 % (ref 34.0–46.6)
Hemoglobin: 15.1 g/dL (ref 11.1–15.9)
IMMATURE GRANS (ABS): 0 10*3/uL (ref 0.0–0.1)
Immature Granulocytes: 0 %
LYMPHS ABS: 2.1 10*3/uL (ref 0.7–3.1)
LYMPHS: 54 %
MCH: 28 pg (ref 26.6–33.0)
MCHC: 33.8 g/dL (ref 31.5–35.7)
MCV: 83 fL (ref 79–97)
MONOCYTES: 8 %
Monocytes Absolute: 0.3 10*3/uL (ref 0.1–0.9)
Neutrophils Absolute: 1.2 10*3/uL — ABNORMAL LOW (ref 1.4–7.0)
Neutrophils: 32 %
Platelets: 282 10*3/uL (ref 150–379)
RBC: 5.39 x10E6/uL — ABNORMAL HIGH (ref 3.77–5.28)
RDW: 13.2 % (ref 12.3–15.4)
WBC: 3.8 10*3/uL (ref 3.4–10.8)

## 2016-08-30 LAB — COMPREHENSIVE METABOLIC PANEL
A/G RATIO: 1.4 (ref 1.2–2.2)
ALK PHOS: 81 IU/L (ref 39–117)
ALT: 20 IU/L (ref 0–32)
AST: 22 IU/L (ref 0–40)
Albumin: 3.9 g/dL (ref 3.5–5.5)
BILIRUBIN TOTAL: 0.5 mg/dL (ref 0.0–1.2)
BUN/Creatinine Ratio: 17 (ref 9–23)
BUN: 10 mg/dL (ref 6–24)
CO2: 19 mmol/L (ref 18–29)
Calcium: 8.9 mg/dL (ref 8.7–10.2)
Chloride: 101 mmol/L (ref 96–106)
Creatinine, Ser: 0.58 mg/dL (ref 0.57–1.00)
GFR calc Af Amer: 129 mL/min/{1.73_m2} (ref >59)
GFR calc non Af Amer: 112 mL/min/{1.73_m2} (ref >59)
GLOBULIN, TOTAL: 2.8 g/dL (ref 1.5–4.5)
Glucose: 90 mg/dL (ref 65–99)
POTASSIUM: 4.6 mmol/L (ref 3.5–5.2)
Sodium: 139 mmol/L (ref 134–144)
Total Protein: 6.7 g/dL (ref 6.0–8.5)

## 2016-08-30 LAB — HGB A1C W/O EAG: Hgb A1c MFr Bld: 5.8 % — ABNORMAL HIGH (ref 4.8–5.6)

## 2016-08-30 LAB — LIPID PANEL W/O CHOL/HDL RATIO
CHOLESTEROL TOTAL: 122 mg/dL (ref 100–199)
HDL: 49 mg/dL (ref >39)
LDL CALC: 57 mg/dL (ref 0–99)
TRIGLYCERIDES: 82 mg/dL (ref 0–149)
VLDL Cholesterol Cal: 16 mg/dL (ref 5–40)

## 2017-05-09 ENCOUNTER — Other Ambulatory Visit: Payer: Self-pay | Admitting: Obstetrics & Gynecology

## 2017-05-09 DIAGNOSIS — Z1231 Encounter for screening mammogram for malignant neoplasm of breast: Secondary | ICD-10-CM

## 2017-06-14 ENCOUNTER — Ambulatory Visit: Payer: BLUE CROSS/BLUE SHIELD | Admitting: General Practice

## 2017-06-14 VITALS — BP 138/86

## 2017-06-14 DIAGNOSIS — R030 Elevated blood-pressure reading, without diagnosis of hypertension: Secondary | ICD-10-CM

## 2017-06-14 NOTE — Progress Notes (Signed)
Noted.  Attempted to call patient on both numbers provided.  Mobile number has been disconnected and unable to leave message on home number.

## 2017-06-14 NOTE — Progress Notes (Signed)
Blood pressures appear adequately controlled. Continue with lifestyle management .

## 2017-09-03 ENCOUNTER — Ambulatory Visit (INDEPENDENT_AMBULATORY_CARE_PROVIDER_SITE_OTHER): Payer: BLUE CROSS/BLUE SHIELD | Admitting: Internal Medicine

## 2017-09-03 ENCOUNTER — Encounter: Payer: Self-pay | Admitting: Internal Medicine

## 2017-09-03 ENCOUNTER — Other Ambulatory Visit (INDEPENDENT_AMBULATORY_CARE_PROVIDER_SITE_OTHER): Payer: BLUE CROSS/BLUE SHIELD

## 2017-09-03 VITALS — BP 110/80 | HR 73 | Temp 98.8°F | Ht 66.25 in | Wt 190.0 lb

## 2017-09-03 DIAGNOSIS — I10 Essential (primary) hypertension: Secondary | ICD-10-CM | POA: Diagnosis not present

## 2017-09-03 DIAGNOSIS — K219 Gastro-esophageal reflux disease without esophagitis: Secondary | ICD-10-CM | POA: Diagnosis not present

## 2017-09-03 DIAGNOSIS — M722 Plantar fascial fibromatosis: Secondary | ICD-10-CM | POA: Diagnosis not present

## 2017-09-03 DIAGNOSIS — L290 Pruritus ani: Secondary | ICD-10-CM

## 2017-09-03 DIAGNOSIS — Z23 Encounter for immunization: Secondary | ICD-10-CM | POA: Diagnosis not present

## 2017-09-03 DIAGNOSIS — M5432 Sciatica, left side: Secondary | ICD-10-CM

## 2017-09-03 DIAGNOSIS — Z0000 Encounter for general adult medical examination without abnormal findings: Secondary | ICD-10-CM

## 2017-09-03 LAB — CBC
HCT: 47.2 % — ABNORMAL HIGH (ref 36.0–46.0)
Hemoglobin: 15.5 g/dL — ABNORMAL HIGH (ref 12.0–15.0)
MCHC: 32.9 g/dL (ref 30.0–36.0)
MCV: 87.2 fl (ref 78.0–100.0)
PLATELETS: 275 10*3/uL (ref 150.0–400.0)
RBC: 5.42 Mil/uL — ABNORMAL HIGH (ref 3.87–5.11)
RDW: 13 % (ref 11.5–15.5)
WBC: 5.1 10*3/uL (ref 4.0–10.5)

## 2017-09-03 LAB — COMPREHENSIVE METABOLIC PANEL
ALBUMIN: 3.9 g/dL (ref 3.5–5.2)
ALK PHOS: 67 U/L (ref 39–117)
ALT: 20 U/L (ref 0–35)
AST: 16 U/L (ref 0–37)
BUN: 11 mg/dL (ref 6–23)
CO2: 22 mEq/L (ref 19–32)
Calcium: 9.2 mg/dL (ref 8.4–10.5)
Chloride: 107 mEq/L (ref 96–112)
Creatinine, Ser: 0.66 mg/dL (ref 0.40–1.20)
GFR: 123.59 mL/min (ref >60.00)
Glucose, Bld: 114 mg/dL — ABNORMAL HIGH (ref 70–99)
POTASSIUM: 4.5 meq/L (ref 3.5–5.1)
Sodium: 139 mEq/L (ref 135–145)
TOTAL PROTEIN: 7.2 g/dL (ref 6.0–8.3)
Total Bilirubin: 0.5 mg/dL (ref 0.2–1.2)

## 2017-09-03 LAB — LIPID PANEL
CHOLESTEROL: 121 mg/dL (ref 0–200)
HDL: 46.7 mg/dL (ref >39.00)
LDL CALC: 61 mg/dL (ref 0–99)
NONHDL: 74.37
Total CHOL/HDL Ratio: 3
Triglycerides: 69 mg/dL (ref 0.0–149.0)
VLDL: 13.8 mg/dL (ref 0.0–40.0)

## 2017-09-03 LAB — TSH: TSH: 2.59 u[IU]/mL (ref 0.35–4.50)

## 2017-09-03 LAB — LIPASE: Lipase: 12 U/L (ref 11.0–59.0)

## 2017-09-03 MED ORDER — PANTOPRAZOLE SODIUM 40 MG PO TBEC: 40.0000 mg | DELAYED_RELEASE_TABLET | Freq: Every day | ORAL | 3 refills | Status: DC

## 2017-09-03 MED ORDER — PREDNISONE 20 MG PO TABS: 40.0000 mg | ORAL_TABLET | Freq: Every day | ORAL | 0 refills | Status: DC

## 2017-09-03 MED ORDER — HYDROCORTISONE 2.5 % RE CREA: 1.0000 "application " | TOPICAL_CREAM | Freq: Two times a day (BID) | RECTAL | 0 refills | Status: DC

## 2017-09-03 NOTE — Assessment & Plan Note (Signed)
Some flare today as well and prednisone burst. Call back if not improved.

## 2017-09-03 NOTE — Assessment & Plan Note (Signed)
BP at goal off meds, continue to monitor.

## 2017-09-03 NOTE — Assessment & Plan Note (Signed)
Rx for protonix to replace pepcid for worsening symptoms. If no relief may need GI referral.

## 2017-09-03 NOTE — Assessment & Plan Note (Signed)
Right foot, new problem. Rx for prednisone short burst and talked with her about stretching exercises and wearing supportive shoes.

## 2017-09-03 NOTE — Patient Instructions (Signed)
We have sent in prednisone for the foot and the back. Take 2 pills daily for 5 days.   We have sent in the cream for the butt that you can use twice a day until it is gone. Then keep the rest and use it if needed again.   We have sent in a stronger medicine for the stomach called protonix which you take 1 pill daily to replace the famotidine (pepcid).

## 2017-09-03 NOTE — Assessment & Plan Note (Signed)
Rx for anusol today to help with itching. Declines rectal exam.

## 2017-09-03 NOTE — Progress Notes (Signed)
   Subjective:    Patient ID: Tami Martin, female    DOB: Aug 18, 1971, 46 y.o.   MRN: [id number]  HPI The patient is a 46 YO female coming in for several concerns including nausea with eating (going on for some time, tried pepcid which is not helping her reflux, no weight change, no vomiting), and itching around her rectum (has tried warm water and soap, going on about 3 weeks or so, not improving, denies constipation or diarrhea, no blood in stool) and foot pain (started about 6 months ago, pain with standing and walking, has not tried anything for it, overall worsening, walking a lot at work, denies change in shoes prior to onset).   Review of Systems  Constitutional: Positive for appetite change. Negative for activity change, chills, diaphoresis, fatigue, fever and unexpected weight change.  HENT: Negative.   Eyes: Negative.   Respiratory: Negative.   Cardiovascular: Negative.   Gastrointestinal: Positive for nausea. Negative for abdominal distention, abdominal pain, anal bleeding, blood in stool, constipation, diarrhea and vomiting.       Anal itching  Musculoskeletal: Positive for arthralgias, back pain and myalgias. Negative for gait problem and joint swelling.  Skin: Negative.   Neurological: Negative.   Psychiatric/Behavioral: Negative.       Objective:   Physical Exam  Constitutional: She is oriented to person, place, and time. She appears well-developed and well-nourished.  HENT:  Head: Normocephalic and atraumatic.  Eyes: EOM are normal.  Neck: Normal range of motion.  Cardiovascular: Normal rate and regular rhythm.   Pulmonary/Chest: Effort normal and breath sounds normal. No respiratory distress. She has no wheezes. She has no rales.  Abdominal: Soft. Bowel sounds are normal. She exhibits no distension. There is no tenderness. There is no rebound.  Musculoskeletal: She exhibits tenderness.  Pain over the plantar surface and heel of right foot, pain in the left  ischial region without radiation  Neurological: She is alert and oriented to person, place, and time. Coordination normal.  Skin: Skin is warm and dry.   Vitals:   09/03/17 0832  BP: 110/80  Pulse: 73  Temp: 98.8 F (37.1 C)  TempSrc: Oral  SpO2: 100%  Weight: 190 lb (86.2 kg)  Height: 5' 6.25" (1.683 m)      Assessment & Plan:  Flu shot given at visit

## 2017-10-11 ENCOUNTER — Encounter: Payer: Self-pay | Admitting: Family Medicine

## 2017-10-11 ENCOUNTER — Ambulatory Visit: Payer: BLUE CROSS/BLUE SHIELD | Admitting: Family Medicine

## 2017-10-11 VITALS — BP 122/66 | HR 76 | Temp 98.5°F | Ht 66.25 in | Wt 190.0 lb

## 2017-10-11 DIAGNOSIS — M722 Plantar fascial fibromatosis: Secondary | ICD-10-CM

## 2017-10-11 DIAGNOSIS — M2141 Flat foot [pes planus] (acquired), right foot: Secondary | ICD-10-CM

## 2017-10-11 DIAGNOSIS — M2142 Flat foot [pes planus] (acquired), left foot: Secondary | ICD-10-CM | POA: Diagnosis not present

## 2017-10-11 DIAGNOSIS — M2140 Flat foot [pes planus] (acquired), unspecified foot: Secondary | ICD-10-CM | POA: Insufficient documentation

## 2017-10-11 MED ORDER — MELOXICAM 15 MG PO TABS: 15.0000 mg | ORAL_TABLET | Freq: Every day | ORAL | 0 refills | Status: DC

## 2017-10-11 MED ORDER — DICLOFENAC SODIUM 2 % TD SOLN: 1.0000 "application " | Freq: Two times a day (BID) | TRANSDERMAL | 3 refills | Status: DC

## 2017-10-11 NOTE — Assessment & Plan Note (Signed)
Findings on exam more suggestive of PF than US findings. Does have a wide foot that is putting mor pressure on the PF.  - Encouraged to obtain gel heel cups, midfoot arch strap, and supportive orthotic - Meloxicam provided and Pennsaid can be used after this. - If no improvement can consider injection therapy.

## 2017-10-11 NOTE — Patient Instructions (Signed)
Thank you for coming in,   Please take the mobic for 10 days straight.   You can use the Pennsaid after that to rub it onto the area.     Please feel free to call with any questions or concerns at any time, at [phone]. --Dr. Jordan LikesSchmitz

## 2017-10-11 NOTE — Progress Notes (Signed)
Tami Martin - 46 y.o. female MRN [id number]  Date of birth: 05/26/1971  SUBJECTIVE:  Including CC & ROS.  Chief Complaint  Patient presents with  . Right heel pain    present for 2+ months. Painful to walk, she states she was given Prednisone-with no improvement,    Tami Martin is a 46 y.o. female that is presenting with right heel pain. Having plantar aspect calcaneal heel pain for 2 months now. She has to walk through her whole shift at [employer]. The pain is exacerbated with prolonged walking. Has had minimal improvement with the prednisone. The pain seems to be intermittent in nature. Denies any inciting event or injury. Pain is sharp and stabbing in nature. Localized to the foot. Has some posterior heel pain as well as the insertion of the Achilles. Denies any prior surgeries. Pain seems to be staying the same.  Seen on 10/1 and diagnosed with plantar fasciitis. Was prescribed prednisone.   Review of Systems  Constitutional: Negative for fever.  Musculoskeletal: Positive for gait problem. Negative for back pain and joint swelling.  Skin: Negative for color change.  Neurological: Negative for weakness and numbness.  Hematological: Negative for adenopathy.    HISTORY: Past Medical, Surgical, Social, and Family History Reviewed & Updated per EMR.   Pertinent Historical Findings include:  Past Medical History:  Diagnosis Date  . Allergy    Seasonal allergies   . DDD (degenerative disc disease), lumbosacral 2011  . GERD (gastroesophageal reflux disease)    denies today 07/2016  . Prediabetes 2014    No past surgical history on file.  No Known Allergies  No family history on file.   Social History   Socioeconomic History  . Marital status: Legally Separated    Spouse name: Not on file  . Number of children: 7  . Years of education: 8  . Highest education level: Not on file  Social Needs  . Financial resource strain: Not on file  . Food insecurity -  worry: Not on file  . Food insecurity - inability: Not on file  . Transportation needs - medical: Not on file  . Transportation needs - non-medical: Not on file  Occupational History  . Occupation: HOUSEKEEPING    Employer: [employer]   Tobacco Use  . Smoking status: Never Smoker  . Smokeless tobacco: Never Used  Substance and Sexual Activity  . Alcohol use: Yes    Comment: once month-wine  . Drug use: No  . Sexual activity: Yes    Birth control/protection: Injection    Comment[facility]  Other Topics Concern  . Not on file  Social History Narrative   [place]   [place]   Lives at home with her 5 younger children.     Her 2 older children [place] themselves [place]   No support from her estranged husband     PHYSICAL EXAM:  VS: BP 122/66 (BP Location: Left Arm, Patient Position: Sitting, Cuff Size: Normal)   Pulse 76   Temp 98.5 F (36.9 C) (Oral)   Ht 5' 6.25" (1.683 m)   Wt 190 lb (86.2 kg)   SpO2 99%   BMI 30.44 kg/m  Physical Exam Gen: NAD, alert, cooperative with exam, well-appearing ENT: normal lips, normal nasal mucosa,  Eye: normal EOM, normal conjunctiva and lids CV:  no edema, +2 pedal pulses   Resp: no accessory  muscle use, non-labored,  GI: no masses or tenderness, no hernia  Skin: no rashes, no areas of induration  Neuro: normal tone, normal sensation to touch Psych:  normal insight, alert and oriented MSK:  Right foot: Has a fairly wide foot with some pes planus  Tenderness to palpation at the insertion of the Achilles onto the calcaneus. Tenderness to palpation on the plantar heel medial aspect. No abnormal callus or ulcer formation. Normal dorsiflexion and plantar flexion. Able to rise up on her tiptoes. Walking with a limp. Neurovascularly intact  Limited ultrasound: Right foot: Normal-appearing peroneal tendons Small posterior calcaneal  spur Normal appearing Achilles Plantar fascia mild swelling  Summary: Findings suggest of mild plantar fasciitis  Ultrasound and interpretation by Clare GandyJeremy Craige Patel, MD               ASSESSMENT & PLAN:   Plantar fasciitis Findings on exam more suggestive of PF than US findings. Does have a wide foot that is putting mor pressure on the PF.  - Encouraged to obtain gel heel cups, midfoot arch strap, and supportive orthotic - Meloxicam provided and Pennsaid can be used after this. - If no improvement can consider injection therapy.  Pes planus This is likely contributing to her plantar foot pain. She has a wide foot as well.  - Encouraged to obtain over-the-counter orthotics. If no improvement consider custom orthotics

## 2017-10-11 NOTE — Assessment & Plan Note (Signed)
This is likely contributing to her plantar foot pain. She has a wide foot as well.  - Encouraged to obtain over-the-counter orthotics. If no improvement consider custom orthotics

## 2018-01-25 ENCOUNTER — Ambulatory Visit: Payer: BLUE CROSS/BLUE SHIELD | Admitting: Internal Medicine

## 2018-01-25 ENCOUNTER — Encounter: Payer: Self-pay | Admitting: Internal Medicine

## 2018-01-25 DIAGNOSIS — J029 Acute pharyngitis, unspecified: Secondary | ICD-10-CM | POA: Diagnosis not present

## 2018-01-25 MED ORDER — LIDOCAINE VISCOUS 2 % MT SOLN: 20.0000 mL | OROMUCOSAL | 0 refills | Status: DC | PRN

## 2018-01-25 MED ORDER — AMOXICILLIN-POT CLAVULANATE 875-125 MG PO TABS
1.0000 | ORAL_TABLET | Freq: Two times a day (BID) | ORAL | 0 refills | Status: DC
Start: 2018-01-25 — End: 2018-03-06

## 2018-01-25 NOTE — Patient Instructions (Addendum)
We have sent in the antibiotic augmentin to take 1 pill twice a day for 1 week.   We have also sent in the liquid medicine to use 5 mL as needed to swallow for the throat pain.   Keep using the ibuprofen if needed for the throat pain as well.

## 2018-01-25 NOTE — Progress Notes (Signed)
   Subjective:    Patient ID: Tami Martin, female    DOB: 12/17/1970, 47 y.o.   MRN: [id number]  HPI The patient is a 47 YO female coming in for sore throat, cough, drainage, ear pain. Started about 2 days ago and worsening. She states sore throat is severe. She is having chills. Feels that she cannot swallow normally and pain with swallowing even water. Drainage from nose, and eyes. Taking her zyrtec daily still without relief. She has tried ibuprofen without much relief. Has been around sick contacts.   Review of Systems  Constitutional: Positive for activity change, appetite change and chills. Negative for fatigue, fever and unexpected weight change.  HENT: Positive for congestion, ear pain, postnasal drip, rhinorrhea, sinus pressure, sore throat and trouble swallowing. Negative for ear discharge, sinus pain, sneezing, tinnitus and voice change.   Eyes: Negative.   Respiratory: Positive for cough. Negative for chest tightness, shortness of breath and wheezing.   Cardiovascular: Negative.   Gastrointestinal: Negative.   Musculoskeletal: Positive for myalgias.  Neurological: Negative.       Objective:   Physical Exam  Constitutional: She is oriented to person, place, and time. She appears well-developed and well-nourished.  HENT:  Head: Normocephalic and atraumatic.  Mouth/Throat: Oropharyngeal exudate present.  Oropharynx with redness and clear drainage tonsils swollen and white purulent material, nose with swollen turbinates, TMs normal bilaterally  Eyes: EOM are normal.  Neck: Normal range of motion. No thyromegaly present.  Cardiovascular: Normal rate and regular rhythm.  Pulmonary/Chest: Effort normal and breath sounds normal. No respiratory distress. She has no wheezes. She has no rales.  Abdominal: Soft.  Musculoskeletal: She exhibits tenderness.  Lymphadenopathy:    She has cervical adenopathy.  Neurological: She is alert and oriented to person, place, and time.    Skin: Skin is warm and dry.   Vitals:   01/25/18 0857  BP: 110/70  Pulse: 74  Temp: 98.4 F (36.9 C)  TempSrc: Oral  SpO2: 98%  Weight: 194 lb (88 kg)  Height: 5' 6.25" (1.683 m)      Assessment & Plan:

## 2018-01-25 NOTE — Assessment & Plan Note (Signed)
Rx for augmentin given sick contacts and pus on tonsils with LAD. Rx for lidocaine viscous for throat pain and continue ibuprofen for pain.

## 2018-03-06 ENCOUNTER — Other Ambulatory Visit (INDEPENDENT_AMBULATORY_CARE_PROVIDER_SITE_OTHER): Payer: BLUE CROSS/BLUE SHIELD

## 2018-03-06 ENCOUNTER — Ambulatory Visit: Payer: BLUE CROSS/BLUE SHIELD | Admitting: Internal Medicine

## 2018-03-06 ENCOUNTER — Encounter: Payer: Self-pay | Admitting: Internal Medicine

## 2018-03-06 VITALS — BP 116/72 | HR 72 | Temp 98.9°F | Ht 66.25 in | Wt 197.0 lb

## 2018-03-06 DIAGNOSIS — I10 Essential (primary) hypertension: Secondary | ICD-10-CM

## 2018-03-06 DIAGNOSIS — M5432 Sciatica, left side: Secondary | ICD-10-CM

## 2018-03-06 DIAGNOSIS — M79671 Pain in right foot: Secondary | ICD-10-CM | POA: Insufficient documentation

## 2018-03-06 DIAGNOSIS — K219 Gastro-esophageal reflux disease without esophagitis: Secondary | ICD-10-CM

## 2018-03-06 DIAGNOSIS — Z0000 Encounter for general adult medical examination without abnormal findings: Secondary | ICD-10-CM | POA: Diagnosis not present

## 2018-03-06 LAB — COMPREHENSIVE METABOLIC PANEL
ALBUMIN: 3.6 g/dL (ref 3.5–5.2)
ALT: 21 U/L (ref 0–35)
AST: 19 U/L (ref 0–37)
Alkaline Phosphatase: 86 U/L (ref 39–117)
BUN: 13 mg/dL (ref 6–23)
CHLORIDE: 105 meq/L (ref 96–112)
CO2: 25 mEq/L (ref 19–32)
Calcium: 8.9 mg/dL (ref 8.4–10.5)
Creatinine, Ser: 0.71 mg/dL (ref 0.40–1.20)
GFR: 113.36 mL/min (ref >60.00)
Glucose, Bld: 133 mg/dL — ABNORMAL HIGH (ref 70–99)
Potassium: 4 mEq/L (ref 3.5–5.1)
Sodium: 137 mEq/L (ref 135–145)
Total Bilirubin: 0.3 mg/dL (ref 0.2–1.2)
Total Protein: 6.8 g/dL (ref 6.0–8.3)

## 2018-03-06 LAB — CBC
HEMATOCRIT: 44 % (ref 36.0–46.0)
HEMOGLOBIN: 14.5 g/dL (ref 12.0–15.0)
MCHC: 32.9 g/dL (ref 30.0–36.0)
MCV: 86.4 fl (ref 78.0–100.0)
Platelets: 256 10*3/uL (ref 150.0–400.0)
RBC: 5.09 Mil/uL (ref 3.87–5.11)
RDW: 12.7 % (ref 11.5–15.5)
WBC: 5.3 10*3/uL (ref 4.0–10.5)

## 2018-03-06 LAB — LIPID PANEL
CHOL/HDL RATIO: 2
CHOLESTEROL: 115 mg/dL (ref 0–200)
HDL: 47.7 mg/dL (ref >39.00)
LDL Cholesterol: 52 mg/dL (ref 0–99)
NonHDL: 67.31
Triglycerides: 78 mg/dL (ref 0.0–149.0)
VLDL: 15.6 mg/dL (ref 0.0–40.0)

## 2018-03-06 LAB — URINALYSIS, ROUTINE W REFLEX MICROSCOPIC
Bilirubin Urine: NEGATIVE
Hgb urine dipstick: NEGATIVE
Ketones, ur: NEGATIVE
Leukocytes, UA: NEGATIVE
NITRITE: NEGATIVE
RBC / HPF: NONE SEEN (ref 0–?)
SPECIFIC GRAVITY, URINE: 1.02 (ref 1.000–1.030)
Total Protein, Urine: NEGATIVE
URINE GLUCOSE: NEGATIVE
UROBILINOGEN UA: 0.2 (ref 0.0–1.0)
pH: 6.5 (ref 5.0–8.0)

## 2018-03-06 LAB — HEMOGLOBIN A1C: Hgb A1c MFr Bld: 5.9 % (ref 4.6–6.5)

## 2018-03-06 MED ORDER — PANTOPRAZOLE SODIUM 40 MG PO TBEC: 40.0000 mg | DELAYED_RELEASE_TABLET | Freq: Every day | ORAL | 3 refills | Status: DC

## 2018-03-06 NOTE — Patient Instructions (Addendum)
We have sent back in the stomach medicine protonix to take 1 pill daily.   We will get you in with the foot specialist to help the foot pain.  We will check the blood and the urine today.  Health Maintenance, Female Adopting a healthy lifestyle and getting preventive care can go a long way to promote health and wellness. Talk with your health care provider about what schedule of regular examinations is right for you. This is a good chance for you to check in with your provider about disease prevention and staying healthy. In between checkups, there are plenty of things you can do on your own. Experts have done a lot of research about which lifestyle changes and preventive measures are most likely to keep you healthy. Ask your health care provider for more information. Weight and diet Eat a healthy diet  Be sure to include plenty of vegetables, fruits, low-fat dairy products, and lean protein.  Do not eat a lot of foods high in solid fats, added sugars, or salt.  Get regular exercise. This is one of the most important things you can do for your health. ? Most adults should exercise for at least 150 minutes each week. The exercise should increase your heart rate and make you sweat (moderate-intensity exercise). ? Most adults should also do strengthening exercises at least twice a week. This is in addition to the moderate-intensity exercise.  Maintain a healthy weight  Body mass index (BMI) is a measurement that can be used to identify possible weight problems. It estimates body fat based on height and weight. Your health care provider can help determine your BMI and help you achieve or maintain a healthy weight.  For females 67 years of age and older: ? A BMI below 18.5 is considered underweight. ? A BMI of 18.5 to 24.9 is normal. ? A BMI of 25 to 29.9 is considered overweight. ? A BMI of 30 and above is considered obese.  Watch levels of cholesterol and blood lipids  You should start  having your blood tested for lipids and cholesterol at 47 years of age, then have this test every 5 years.  You may need to have your cholesterol levels checked more often if: ? Your lipid or cholesterol levels are high. ? You are older than 47 years of age. ? You are at high risk for heart disease.  Cancer screening Lung Cancer  Lung cancer screening is recommended for adults 38-5 years old who are at high risk for lung cancer because of a history of smoking.  A yearly low-dose CT scan of the lungs is recommended for people who: ? Currently smoke. ? Have quit within the past 15 years. ? Have at least a 30-pack-year history of smoking. A pack year is smoking an average of one pack of cigarettes a day for 1 year.  Yearly screening should continue until it has been 15 years since you quit.  Yearly screening should stop if you develop a health problem that would prevent you from having lung cancer treatment.  Breast Cancer  Practice breast self-awareness. This means understanding how your breasts normally appear and feel.  It also means doing regular breast self-exams. Let your health care provider know about any changes, no matter how small.  If you are in your 20s or 30s, you should have a clinical breast exam (CBE) by a health care provider every 1-3 years as part of a regular health exam.  If you are 40 or  older, have a CBE every year. Also consider having a breast X-ray (mammogram) every year.  If you have a family history of breast cancer, talk to your health care provider about genetic screening.  If you are at high risk for breast cancer, talk to your health care provider about having an MRI and a mammogram every year.  Breast cancer gene (BRCA) assessment is recommended for women who have family members with BRCA-related cancers. BRCA-related cancers include: ? Breast. ? Ovarian. ? Tubal. ? Peritoneal cancers.  Results of the assessment will determine the need for  genetic counseling and BRCA1 and BRCA2 testing.  Cervical Cancer Your health care provider may recommend that you be screened regularly for cancer of the pelvic organs (ovaries, uterus, and vagina). This screening involves a pelvic examination, including checking for microscopic changes to the surface of your cervix (Pap test). You may be encouraged to have this screening done every 3 years, beginning at age 41.  For women ages 46-65, health care providers may recommend pelvic exams and Pap testing every 3 years, or they may recommend the Pap and pelvic exam, combined with testing for human papilloma virus (HPV), every 5 years. Some types of HPV increase your risk of cervical cancer. Testing for HPV may also be done on women of any age with unclear Pap test results.  Other health care providers may not recommend any screening for nonpregnant women who are considered low risk for pelvic cancer and who do not have symptoms. Ask your health care provider if a screening pelvic exam is right for you.  If you have had past treatment for cervical cancer or a condition that could lead to cancer, you need Pap tests and screening for cancer for at least 20 years after your treatment. If Pap tests have been discontinued, your risk factors (such as having a new sexual partner) need to be reassessed to determine if screening should resume. Some women have medical problems that increase the chance of getting cervical cancer. In these cases, your health care provider may recommend more frequent screening and Pap tests.  Colorectal Cancer  This type of cancer can be detected and often prevented.  Routine colorectal cancer screening usually begins at 47 years of age and continues through 47 years of age.  Your health care provider may recommend screening at an earlier age if you have risk factors for colon cancer.  Your health care provider may also recommend using home test kits to check for hidden blood in the  stool.  A small camera at the end of a tube can be used to examine your colon directly (sigmoidoscopy or colonoscopy). This is done to check for the earliest forms of colorectal cancer.  Routine screening usually begins at age 98.  Direct examination of the colon should be repeated every 5-10 years through 47 years of age. However, you may need to be screened more often if early forms of precancerous polyps or small growths are found.  Skin Cancer  Check your skin from head to toe regularly.  Tell your health care provider about any new moles or changes in moles, especially if there is a change in a mole's shape or color.  Also tell your health care provider if you have a mole that is larger than the size of a pencil eraser.  Always use sunscreen. Apply sunscreen liberally and repeatedly throughout the day.  Protect yourself by wearing long sleeves, pants, a wide-brimmed hat, and sunglasses whenever you are outside.  Heart disease, diabetes, and high blood pressure  High blood pressure causes heart disease and increases the risk of stroke. High blood pressure is more likely to develop in: ? People who have blood pressure in the high end of the normal range (130-139/85-89 mm Hg). ? People who are overweight or obese. ? People who are African American.  If you are 28-75 years of age, have your blood pressure checked every 3-5 years. If you are 61 years of age or older, have your blood pressure checked every year. You should have your blood pressure measured twice-once when you are at a hospital or clinic, and once when you are not at a hospital or clinic. Record the average of the two measurements. To check your blood pressure when you are not at a hospital or clinic, you can use: ? An automated blood pressure machine at a pharmacy. ? A home blood pressure monitor.  If you are between 45 years and 3 years old, ask your health care provider if you should take aspirin to prevent  strokes.  Have regular diabetes screenings. This involves taking a blood sample to check your fasting blood sugar level. ? If you are at a normal weight and have a low risk for diabetes, have this test once every three years after 47 years of age. ? If you are overweight and have a high risk for diabetes, consider being tested at a younger age or more often. Preventing infection Hepatitis B  If you have a higher risk for hepatitis B, you should be screened for this virus. You are considered at high risk for hepatitis B if: ? You were born in a country where hepatitis B is common. Ask your health care provider which countries are considered high risk. ? Your parents were born in a high-risk country, and you have not been immunized against hepatitis B (hepatitis B vaccine). ? You have HIV or AIDS. ? You use needles to inject street drugs. ? You live with someone who has hepatitis B. ? You have had sex with someone who has hepatitis B. ? You get hemodialysis treatment. ? You take certain medicines for conditions, including cancer, organ transplantation, and autoimmune conditions.  Hepatitis C  Blood testing is recommended for: ? Everyone born from [birth year] through 1965. ? Anyone with known risk factors for hepatitis C.  Sexually transmitted infections (STIs)  You should be screened for sexually transmitted infections (STIs) including gonorrhea and chlamydia if: ? You are sexually active and are younger than 47 years of age. ? You are older than 47 years of age and your health care provider tells you that you are at risk for this type of infection. ? Your sexual activity has changed since you were last screened and you are at an increased risk for chlamydia or gonorrhea. Ask your health care provider if you are at risk.  If you do not have HIV, but are at risk, it may be recommended that you take a prescription medicine daily to prevent HIV infection. This is called pre-exposure prophylaxis  (PrEP). You are considered at risk if: ? You are sexually active and do not regularly use condoms or know the HIV status of your partner(s). ? You take drugs by injection. ? You are sexually active with a partner who has HIV.  Talk with your health care provider about whether you are at high risk of being infected with HIV. If you choose to begin PrEP, you should first be tested for HIV.  You should then be tested every 3 months for as long as you are taking PrEP. Pregnancy  If you are premenopausal and you may become pregnant, ask your health care provider about preconception counseling.  If you may become pregnant, take 400 to 800 micrograms (mcg) of folic acid every day.  If you want to prevent pregnancy, talk to your health care provider about birth control (contraception). Osteoporosis and menopause  Osteoporosis is a disease in which the bones lose minerals and strength with aging. This can result in serious bone fractures. Your risk for osteoporosis can be identified using a bone density scan.  If you are 13 years of age or older, or if you are at risk for osteoporosis and fractures, ask your health care provider if you should be screened.  Ask your health care provider whether you should take a calcium or vitamin D supplement to lower your risk for osteoporosis.  Menopause may have certain physical symptoms and risks.  Hormone replacement therapy may reduce some of these symptoms and risks. Talk to your health care provider about whether hormone replacement therapy is right for you. Follow these instructions at home:  Schedule regular health, dental, and eye exams.  Stay current with your immunizations.  Do not use any tobacco products including cigarettes, chewing tobacco, or electronic cigarettes.  If you are pregnant, do not drink alcohol.  If you are breastfeeding, limit how much and how often you drink alcohol.  Limit alcohol intake to no more than 1 drink per day for  nonpregnant women. One drink equals 12 ounces of beer, 5 ounces of wine, or 1 ounces of hard liquor.  Do not use street drugs.  Do not share needles.  Ask your health care provider for help if you need support or information about quitting drugs.  Tell your health care provider if you often feel depressed.  Tell your health care provider if you have ever been abused or do not feel safe at home. This information is not intended to replace advice given to you by your health care provider. Make sure you discuss any questions you have with your health care provider. Document Released: 06/05/2011 Document Revised: 04/27/2016 Document Reviewed: 08/24/2015 Elsevier Interactive Patient Education  [employer].

## 2018-03-06 NOTE — Assessment & Plan Note (Signed)
Protonix worked well and she is having recurrent symptoms off medication. Refilled today.

## 2018-03-06 NOTE — Assessment & Plan Note (Signed)
Referral to podiatry for possible injection. Tried pennsaid which was not effective.

## 2018-03-06 NOTE — Progress Notes (Signed)
   Subjective:    Patient ID: Tami Martin, female    DOB: 05/31/1971, 47 y.o.   MRN: [id number]  HPI The patient is a 47 YO female coming in for physical.   [facility], [facility] history reviewed and updated.  Review of Systems  Constitutional: Negative.   HENT: Negative.   Eyes: Negative.   Respiratory: Negative for cough, chest tightness and shortness of breath.   Cardiovascular: Negative for chest pain, palpitations and leg swelling.  Gastrointestinal: Negative for abdominal distention, abdominal pain, constipation, diarrhea, nausea and vomiting.       Gerd  Musculoskeletal: Positive for arthralgias, back pain and myalgias. Negative for gait problem, neck pain and neck stiffness.  Skin: Negative.   Neurological: Negative.   Psychiatric/Behavioral: Negative.       Objective:   Physical Exam  Constitutional: She is oriented to person, place, and time. She appears well-developed and well-nourished.  HENT:  Head: Normocephalic and atraumatic.  Eyes: EOM are normal.  Neck: Normal range of motion.  Cardiovascular: Normal rate and regular rhythm.  Pulmonary/Chest: Effort normal and breath sounds normal. No respiratory distress. She has no wheezes. She has no rales.  Abdominal: Soft. Bowel sounds are normal. She exhibits no distension. There is no tenderness. There is no rebound.  Musculoskeletal: She exhibits tenderness. She exhibits no edema.  Pain right ankle and right heel  Neurological: She is alert and oriented to person, place, and time. Coordination normal.  Skin: Skin is warm and dry.  Psychiatric: She has a normal mood and affect.   Vitals:   03/06/18 0821  BP: 116/72  Pulse: 72  Temp: 98.9 F (37.2 C)  TempSrc: Oral  SpO2: 98%  Weight: 197 lb (89.4 kg)  Height: 5' 6.25" (1.683 m)      Assessment & Plan:

## 2018-03-06 NOTE — Assessment & Plan Note (Signed)
Pap smear up to date with gyn records requested. Flu and tetanus up to date. Declines hiv screening. Counseled about sun safety and stretching/exercise. Given screening recommendations.

## 2018-03-06 NOTE — Assessment & Plan Note (Signed)
At goal off meds. Continue to monitor closely.

## 2018-03-06 NOTE — Assessment & Plan Note (Signed)
Stable and chronic, surgery is recommended and she is not having enough pain to do that now.

## 2018-03-19 ENCOUNTER — Other Ambulatory Visit: Payer: Self-pay | Admitting: Podiatry

## 2018-03-19 ENCOUNTER — Ambulatory Visit (INDEPENDENT_AMBULATORY_CARE_PROVIDER_SITE_OTHER): Payer: BLUE CROSS/BLUE SHIELD | Admitting: Podiatry

## 2018-03-19 ENCOUNTER — Ambulatory Visit (INDEPENDENT_AMBULATORY_CARE_PROVIDER_SITE_OTHER): Payer: BLUE CROSS/BLUE SHIELD

## 2018-03-19 ENCOUNTER — Encounter: Payer: Self-pay | Admitting: Podiatry

## 2018-03-19 VITALS — BP 137/87 | HR 75 | Resp 16

## 2018-03-19 DIAGNOSIS — M722 Plantar fascial fibromatosis: Secondary | ICD-10-CM

## 2018-03-19 DIAGNOSIS — M779 Enthesopathy, unspecified: Principal | ICD-10-CM

## 2018-03-19 DIAGNOSIS — M778 Other enthesopathies, not elsewhere classified: Secondary | ICD-10-CM

## 2018-03-19 MED ORDER — METHYLPREDNISOLONE 4 MG PO TBPK: ORAL_TABLET | ORAL | 0 refills | Status: DC

## 2018-03-19 MED ORDER — MELOXICAM 15 MG PO TABS: 15.0000 mg | ORAL_TABLET | Freq: Every day | ORAL | 3 refills | Status: DC

## 2018-03-19 NOTE — Patient Instructions (Signed)

## 2018-03-19 NOTE — Progress Notes (Signed)
Subjective:  Patient ID: Tami Martin, female    DOB: June 05, 1971,  MRN: [id number] HPI Chief Complaint  Patient presents with  . Foot Pain    Plantar heel and lateral side right - aching x 10 months, pain is constant if on feet, saw doc - Rx'd Pennsaid-no help    47 y.o. female presents with the above complaint.   ROS: Denies fever chills nausea vomiting muscle aches pains calf pain back pain chest pain shortness of breath or headache.  Past Medical History:  Diagnosis Date  . Allergy    Seasonal allergies   . DDD (degenerative disc disease), lumbosacral 2011  . GERD (gastroesophageal reflux disease)    denies today 07/2016  . Prediabetes 2014   No past surgical history on file.  Current Outpatient Medications:  .  Calcium Carbonate-Vit D-Min 1200-1000 MG-UNIT CHEW, Chew 1 capsule by mouth daily., Disp: , Rfl:  .  cetirizine (ZYRTEC) 10 MG tablet, Take 1 tablet (10 mg total) by mouth daily., Disp: 30 tablet, Rfl: 11 .  hydrocortisone (ANUSOL-HC) 2.5 % rectal cream, Place 1 application rectally 2 (two) times daily., Disp: 30 g, Rfl: 0 .  medroxyPROGESTERone (DEPO-PROVERA) 150 MG/ML injection, Inject 150 mg into the muscle every 3 (three) months., Disp: , Rfl:  .  meloxicam (MOBIC) 15 MG tablet, Take 1 tablet (15 mg total) by mouth daily., Disp: 30 tablet, Rfl: 3 .  methylPREDNISolone (MEDROL DOSEPAK) 4 MG TBPK tablet, 6 day dose pack - take as directed, Disp: 21 tablet, Rfl: 0 .  Multiple Vitamin (MULTIVITAMIN WITH MINERALS) TABS tablet, Take 1 tablet by mouth daily., Disp: , Rfl:  .  Olopatadine HCl (PATADAY) 0.2 % SOLN, 1 drop both eyes once daily, Disp: 2.5 mL, Rfl: 11 .  pantoprazole (PROTONIX) 40 MG tablet, Take 1 tablet (40 mg total) by mouth daily., Disp: 90 tablet, Rfl: 3 .  SUMAtriptan (IMITREX) 100 MG tablet, Take 1 tablet (100 mg total) by mouth every 2 (two) hours as needed for migraine or headache. May repeat in 2 hours if headache persists or recurs., Disp: 10  tablet, Rfl: 0  No Known Allergies Review of Systems Objective:   Vitals:   03/19/18 1039  BP: 137/87  Pulse: 75  Resp: 16    General: Well developed, nourished, in no acute distress, alert and oriented x3   Dermatological: Skin is warm, dry and supple bilateral. Nails x 10 are well maintained; remaining integument appears unremarkable at this time. There are no open sores, no preulcerative lesions, no rash or signs of infection present.  Vascular: Dorsalis Pedis artery and Posterior Tibial artery pedal pulses are 2/4 bilateral with immedate capillary fill time. Pedal hair growth present. No varicosities and no lower extremity edema present bilateral.   Neruologic: Grossly intact via light touch bilateral. Vibratory intact via tuning fork bilateral. Protective threshold with Semmes Wienstein monofilament intact to all pedal sites bilateral. Patellar and Achilles deep tendon reflexes 2+ bilateral. No Babinski or clonus noted bilateral.   Musculoskeletal: No gross boney pedal deformities bilateral. No pain, crepitus, or limitation noted with foot and ankle range of motion bilateral. Muscular strength 5/5 in all groups tested bilateral.  She has pain on palpation medial calcaneal tubercle of the right heel.  No pain on medial and lateral compression of the calcaneus.  Gait: Unassisted, Nonantalgic.    Radiographs:  Radiographs taken today demonstrate no acute findings.  Soft tissue increase in density at the plantar fascial calcaneal insertion site of her right  heel.  Plantar distally oriented calcaneal spur is present.  No fractures are identified.  Assessment & Plan:   Assessment: Plantar fasciitis pes planus right.    Plan: Discussed etiology pathology conservative versus surgical therapies.  Start her on a Medrol Dosepak to be followed by meloxicam.  After alcohol swab I injected 20 mg of Kenalog 5 mg of Marcaine to the medial aspect of the right heel at the plantar fascial  calcaneal insertion site.  She tolerated procedure well without complications.  Placed her in plantar fascial brace discussed appropriate shoe gear stretching exercises ice therapy follow-up with her in 1 month.     Jameal Razzano T. [place] City[place], [place]PM

## 2018-04-25 ENCOUNTER — Ambulatory Visit (INDEPENDENT_AMBULATORY_CARE_PROVIDER_SITE_OTHER): Payer: BLUE CROSS/BLUE SHIELD | Admitting: Podiatry

## 2018-04-25 ENCOUNTER — Encounter: Payer: Self-pay | Admitting: Podiatry

## 2018-04-25 DIAGNOSIS — M722 Plantar fascial fibromatosis: Secondary | ICD-10-CM

## 2018-04-25 NOTE — Progress Notes (Signed)
She presents today states that she is about 50% improved and she continues to use her plantar fascial brace and her meloxicam.  Objective: Vital signs are stable she is alert and oriented x3.  Pulses are palpable.  She still has some tenderness on palpation medial calcaneal tubercle much increased from previous visit.  Assessment: Approximately 50% resolved plantar fasciitis right.  Plan: At this point I reinjected 20 mg Kenalog 5 mg Marcaine after sterile Betadine skin prep medial aspect of the right foot tolerated procedure well without complications follow-up with her in 6 weeks.  Continue all conservative therapies.

## 2018-06-20 ENCOUNTER — Ambulatory Visit: Payer: BLUE CROSS/BLUE SHIELD | Admitting: Podiatry

## 2018-07-09 ENCOUNTER — Encounter: Payer: Self-pay | Admitting: Podiatry

## 2018-07-09 ENCOUNTER — Ambulatory Visit (INDEPENDENT_AMBULATORY_CARE_PROVIDER_SITE_OTHER): Payer: BLUE CROSS/BLUE SHIELD | Admitting: Podiatry

## 2018-07-09 DIAGNOSIS — M722 Plantar fascial fibromatosis: Secondary | ICD-10-CM | POA: Diagnosis not present

## 2018-07-10 NOTE — Progress Notes (Signed)
She presents today for follow-up plantar fasciitis right states that is still hurting.  She states that is hurting over here more she points to the lateral side of the right heel.  Objective: Vital signs are stable she is alert and oriented x3.  No pain on palpation medial calcaneal tubercle she does have some pain on palpation to the lateral calcaneal tubercle no erythema edema cellulitis drainage or odor pulses remain palpable.  Assessment: Plantar fasciitis right.  Plan: Injected the lateral aspect of the heel today 20 mg Kenalog 5 mg Marcaine after sterile Betadine skin prep tolerated procedure well.  Also will follow-up with Raiford Nobleick for orthotics.

## 2018-07-29 ENCOUNTER — Ambulatory Visit (INDEPENDENT_AMBULATORY_CARE_PROVIDER_SITE_OTHER): Payer: BLUE CROSS/BLUE SHIELD | Admitting: Orthotics

## 2018-07-29 DIAGNOSIS — M722 Plantar fascial fibromatosis: Secondary | ICD-10-CM

## 2018-07-29 NOTE — Progress Notes (Signed)
Patient came into today for casting bilateral f/o to address plantar fasciitis.  Patient reports history of foot pain involving plantar aponeurosis.  Goal is to provide longitudinal arch support and correct any RF instability due to heel eversion/inversion.  Ultimate goal is to relieve tension at pf insertion calcaneal tuberosity.  Plan on semi-rigid device addressing heel stability and relieving PF tension.     EVERFEET

## 2018-08-12 ENCOUNTER — Ambulatory Visit: Payer: BLUE CROSS/BLUE SHIELD | Admitting: Orthotics

## 2018-08-12 DIAGNOSIS — M722 Plantar fascial fibromatosis: Secondary | ICD-10-CM

## 2018-08-12 NOTE — Progress Notes (Signed)
Patient came in today to pick up custom made foot orthotics.  The goals were accomplished and the patient reported no dissatisfaction with said orthotics.  Patient was advised of breakin period and how to report any issues. 

## 2018-08-13 ENCOUNTER — Telehealth (HOSPITAL_COMMUNITY): Payer: Self-pay | Admitting: *Deleted

## 2018-08-13 ENCOUNTER — Other Ambulatory Visit: Payer: Self-pay | Admitting: Obstetrics & Gynecology

## 2018-08-13 DIAGNOSIS — Z1231 Encounter for screening mammogram for malignant neoplasm of breast: Secondary | ICD-10-CM

## 2018-08-13 NOTE — Telephone Encounter (Signed)
Telephoned patient at home number. Patient stated has [employer]BS and does not qualify for [facility]CP. [facility] and phone given to patient.

## 2018-08-14 ENCOUNTER — Encounter: Payer: Self-pay | Admitting: Internal Medicine

## 2018-08-14 ENCOUNTER — Ambulatory Visit (INDEPENDENT_AMBULATORY_CARE_PROVIDER_SITE_OTHER): Payer: BLUE CROSS/BLUE SHIELD | Admitting: Internal Medicine

## 2018-08-14 VITALS — BP 142/90 | HR 67 | Temp 99.2°F | Ht 66.25 in | Wt 190.0 lb

## 2018-08-14 DIAGNOSIS — G44201 Tension-type headache, unspecified, intractable: Secondary | ICD-10-CM | POA: Diagnosis not present

## 2018-08-14 DIAGNOSIS — I10 Essential (primary) hypertension: Secondary | ICD-10-CM | POA: Diagnosis not present

## 2018-08-14 DIAGNOSIS — J0110 Acute frontal sinusitis, unspecified: Secondary | ICD-10-CM | POA: Diagnosis not present

## 2018-08-14 MED ORDER — AMOXICILLIN-POT CLAVULANATE 875-125 MG PO TABS: 1.0000 | ORAL_TABLET | Freq: Two times a day (BID) | ORAL | 0 refills | Status: DC

## 2018-08-14 MED ORDER — AMLODIPINE BESYLATE 5 MG PO TABS: 5.0000 mg | ORAL_TABLET | Freq: Every day | ORAL | 3 refills | Status: DC

## 2018-08-14 NOTE — Progress Notes (Signed)
   Subjective:    Patient ID: Tami Martin, female    DOB: Oct 06, 1971, 47 y.o.   MRN: [id number]  HPI The patient is a 47 YO female coming in for several concerns including high blood pressure (had it checked at work, was running 180/90s at work, she was having some headaches with it, some chest soreness which she thinks is related to coughing, ) and not feeling well (having sinus pressure, mild cough, denies SOB, some chills no fevers, denies coughing up anything, does a lot of bending, some headaches and sinus pressure, started in the last week or so, overall worsening) and headaches (having headaches the last 2-3 days with the high blood pressure, she is having some sinus pain and pressure as well).   Review of Systems  Constitutional: Positive for activity change, appetite change, chills and fatigue. Negative for fever and unexpected weight change.  HENT: Positive for congestion, postnasal drip, rhinorrhea, sinus pressure, sinus pain and sore throat. Negative for ear discharge, ear pain, sneezing, tinnitus, trouble swallowing and voice change.   Eyes: Negative.   Respiratory: Positive for cough. Negative for chest tightness, shortness of breath and wheezing.   Cardiovascular: Negative.   Gastrointestinal: Negative.   Musculoskeletal: Positive for back pain and myalgias. Negative for arthralgias, gait problem and joint swelling.  Skin: Negative.   Neurological: Positive for headaches. Negative for dizziness, tremors, seizures, syncope, weakness and numbness.  Psychiatric/Behavioral: Negative.       Objective:   Physical Exam  Constitutional: She is oriented to person, place, and time. She appears well-developed and well-nourished.  HENT:  Head: Normocephalic and atraumatic.  Oropharynx with redness, nose with crusting, frontal sinus tenderness, TMs bulging clear to cloudy fluid  Eyes: EOM are normal.  Neck: Normal range of motion.  Cardiovascular: Normal rate and regular  rhythm.  Pulmonary/Chest: Effort normal and breath sounds normal. No respiratory distress. She has no wheezes. She has no rales. She exhibits tenderness.  Soreness left flank tender with palpation and with bending or twisting.   Abdominal: Soft. Bowel sounds are normal. She exhibits no distension. There is no tenderness. There is no rebound.  Musculoskeletal: She exhibits no edema.  Lymphadenopathy:    She has cervical adenopathy.  Neurological: She is alert and oriented to person, place, and time. Coordination normal.  Skin: Skin is warm and dry.  Psychiatric: She has a normal mood and affect.   Vitals:   08/14/18 0806  BP: (!) 142/90  Pulse: 67  Temp: 99.2 F (37.3 C)  TempSrc: Oral  SpO2: 98%  Weight: 190 lb (86.2 kg)  Height: 5' 6.25" (1.683 m)   EKG: Rate 67, left axis, sinus, intervals normal, poor r wave progression, no st or t wave changes, no prior to compare    Assessment & Plan:

## 2018-08-14 NOTE — Patient Instructions (Addendum)
The EKG is fine.   We will treat you for sinus infection today with augmentin 1 pill twice a day for 10 days.   If you are still having the headaches or high blood pressure call the office.   I recommend to start taking zyrtec to help with sinus congestion and pressure as well.   I have sent in a blood pressure medicine called amlodipine to take 1 pill daily.

## 2018-08-15 DIAGNOSIS — J329 Chronic sinusitis, unspecified: Secondary | ICD-10-CM | POA: Insufficient documentation

## 2018-08-15 DIAGNOSIS — J019 Acute sinusitis, unspecified: Secondary | ICD-10-CM | POA: Insufficient documentation

## 2018-08-15 NOTE — Assessment & Plan Note (Addendum)
Will start amlodipine 5 mg daily. Have her monitor readings at work. EKG done without signs of acute ischemia.

## 2018-08-15 NOTE — Assessment & Plan Note (Signed)
Rx for augmentin 10 day course for sinus infection.

## 2018-08-15 NOTE — Assessment & Plan Note (Signed)
Suspect related to sinuses. Will treat that and stabilize BP as well.

## 2018-09-18 ENCOUNTER — Ambulatory Visit
Admission: RE | Admit: 2018-09-18 | Discharge: 2018-09-18 | Disposition: A | Discharge status: Home / Self Care | Payer: BLUE CROSS/BLUE SHIELD | Source: Ambulatory Visit | Attending: Obstetrics & Gynecology | Admitting: Obstetrics & Gynecology

## 2018-09-18 DIAGNOSIS — Z1231 Encounter for screening mammogram for malignant neoplasm of breast: Secondary | ICD-10-CM

## 2018-09-19 ENCOUNTER — Other Ambulatory Visit: Payer: Self-pay | Admitting: Obstetrics & Gynecology

## 2018-09-19 DIAGNOSIS — R928 Other abnormal and inconclusive findings on diagnostic imaging of breast: Secondary | ICD-10-CM

## 2018-10-04 ENCOUNTER — Ambulatory Visit
Admission: RE | Admit: 2018-10-04 | Discharge: 2018-10-04 | Disposition: A | Discharge status: Home / Self Care | Payer: BLUE CROSS/BLUE SHIELD | Source: Ambulatory Visit | Attending: Obstetrics & Gynecology | Admitting: Obstetrics & Gynecology

## 2018-10-04 DIAGNOSIS — R928 Other abnormal and inconclusive findings on diagnostic imaging of breast: Secondary | ICD-10-CM

## 2018-12-04 HISTORY — PX: KNEE SURGERY: SHX244

## 2019-01-17 ENCOUNTER — Ambulatory Visit: Payer: BLUE CROSS/BLUE SHIELD | Admitting: Family Medicine

## 2019-01-17 ENCOUNTER — Encounter: Payer: Self-pay | Admitting: Family Medicine

## 2019-01-17 ENCOUNTER — Other Ambulatory Visit (INDEPENDENT_AMBULATORY_CARE_PROVIDER_SITE_OTHER): Payer: BLUE CROSS/BLUE SHIELD

## 2019-01-17 VITALS — BP 132/90 | HR 87 | Temp 97.9°F | Resp 16 | Ht 66.25 in | Wt 195.0 lb

## 2019-01-17 DIAGNOSIS — R5383 Other fatigue: Secondary | ICD-10-CM | POA: Diagnosis not present

## 2019-01-17 DIAGNOSIS — R0689 Other abnormalities of breathing: Secondary | ICD-10-CM

## 2019-01-17 LAB — CBC WITH DIFFERENTIAL/PLATELET
Basophils Absolute: 0.1 10*3/uL (ref 0.0–0.1)
Basophils Relative: 1.1 % (ref 0.0–3.0)
EOS PCT: 4.2 % (ref 0.0–5.0)
Eosinophils Absolute: 0.2 10*3/uL (ref 0.0–0.7)
HEMATOCRIT: 44.8 % (ref 36.0–46.0)
HEMOGLOBIN: 15 g/dL (ref 12.0–15.0)
LYMPHS ABS: 2.5 10*3/uL (ref 0.7–4.0)
LYMPHS PCT: 45.1 % (ref 12.0–46.0)
MCHC: 33.5 g/dL (ref 30.0–36.0)
MCV: 86.8 fl (ref 78.0–100.0)
MONOS PCT: 12.6 % — AB (ref 3.0–12.0)
Monocytes Absolute: 0.7 10*3/uL (ref 0.1–1.0)
Neutro Abs: 2 10*3/uL (ref 1.4–7.7)
Neutrophils Relative %: 37 % — ABNORMAL LOW (ref 43.0–77.0)
PLATELETS: 230 10*3/uL (ref 150.0–400.0)
RBC: 5.16 Mil/uL — AB (ref 3.87–5.11)
RDW: 13.1 % (ref 11.5–15.5)
WBC: 5.5 10*3/uL (ref 4.0–10.5)

## 2019-01-17 LAB — COMPREHENSIVE METABOLIC PANEL
ALK PHOS: 82 U/L (ref 39–117)
ALT: 30 U/L (ref 0–35)
AST: 22 U/L (ref 0–37)
Albumin: 3.6 g/dL (ref 3.5–5.2)
BILIRUBIN TOTAL: 0.3 mg/dL (ref 0.2–1.2)
BUN: 11 mg/dL (ref 6–23)
CALCIUM: 8.6 mg/dL (ref 8.4–10.5)
CO2: 22 mEq/L (ref 19–32)
Chloride: 106 mEq/L (ref 96–112)
Creatinine, Ser: 0.66 mg/dL (ref 0.40–1.20)
GFR: 115.6 mL/min (ref >60.00)
Glucose, Bld: 119 mg/dL — ABNORMAL HIGH (ref 70–99)
Potassium: 4.1 mEq/L (ref 3.5–5.1)
Sodium: 138 mEq/L (ref 135–145)
Total Protein: 6.3 g/dL (ref 6.0–8.3)

## 2019-01-17 LAB — TSH: TSH: 4.16 u[IU]/mL (ref 0.35–4.50)

## 2019-01-17 NOTE — Assessment & Plan Note (Signed)
Unclear as to the source of her fatigue.  Nothing suggestive on history.  Does have a history of anemia. -TSH, CMP, CBC,

## 2019-01-17 NOTE — Assessment & Plan Note (Signed)
Reports having trouble breathing with no specific association.  No prior history of asthma or tobacco use. -Chest x-ray.

## 2019-01-17 NOTE — Progress Notes (Signed)
Tami Martin - 48 y.o. female MRN [id number]  Date of birth: June 05, 1971  SUBJECTIVE:  Including CC & ROS.  No chief complaint on file.   Tami Martin is a 48 y.o. female that is presenting with fatigue and trouble breathing.  She feels like her symptoms have been ongoing for about a month.  Denies any recent travel.  Has not had any new or different medications.  She does not associate her symptoms with any specific thing.  Feels like her symptoms came out of nowhere.  She has been in the [place] for about 10 years.  She is originally from [place].  No fevers or chills.  Has not been around anyone with similar symptoms.    Review of Systems  Constitutional: Positive for activity change. Negative for fever.  HENT: Negative for congestion.   Respiratory: Negative for cough.   Cardiovascular: Negative for chest pain.  Gastrointestinal: Negative for abdominal pain.  Musculoskeletal: Negative for gait problem.  Skin: Negative for color change.  Neurological: Positive for weakness.  Hematological: Negative for adenopathy.  Psychiatric/Behavioral: Negative for agitation.    HISTORY: Past Medical, Surgical, Social, and Family History Reviewed & Updated per EMR.   Pertinent Historical Findings include:  Past Medical History:  Diagnosis Date  . Allergy    Seasonal allergies   . DDD (degenerative disc disease), lumbosacral 2011  . GERD (gastroesophageal reflux disease)    denies today 07/2016  . Prediabetes 2014    No past surgical history on file.  No Known Allergies  No family history on file.   Social History   Socioeconomic History  . Marital status: Legally Separated    Spouse name: Not on file  . Number of children: 7  . Years of education: 8  . Highest education level: Not on file  Occupational History  . Occupation: Equities trader: [employer]   Social Needs  . Financial resource strain: Not on file  . Food  insecurity:    Worry: Not on file    Inability: Not on file  . Transportation needs:    Medical: Not on file    Non-medical: Not on file  Tobacco Use  . Smoking status: Never Smoker  . Smokeless tobacco: Never Used  Substance and Sexual Activity  . Alcohol use: Yes    Comment: once month-wine  . Drug use: No  . Sexual activity: Yes    Birth control/protection: Injection    Comment: Depo Provera every 3 months at [facility]  Lifestyle  . Physical activity:    Days per week: Not on file    Minutes per session: Not on file  . Stress: Not on file  Relationships  . Social connections:    Talks on phone: Not on file    Gets together: Not on file    Attends religious service: Not on file    Active member of club or organization: Not on file    Attends meetings of clubs or organizations: Not on file    Relationship status: Not on file  . Intimate partner violence:    Fear of current or ex partner: Not on file    Emotionally abused: Not on file    Physically abused: Not on file    Forced sexual activity: Not on file  Other Topics Concern  . Not on file  Social History Narrative   Originally from [place]   Came to [place] [employer] [place]. In 2010  Lives at home with her 5 younger children.     Her 2 older children live by themselves here in [place]   No support from her estranged husband     PHYSICAL EXAM:  VS: BP 132/90   Pulse 87   Temp 97.9 F (36.6 C) (Oral)   Resp 16   Ht 5' 6.25" (1.683 m)   Wt 195 lb (88.5 kg)   SpO2 98%   BMI 31.24 kg/m  Physical Exam Gen: NAD, alert, cooperative with exam,  ENT: normal lips, normal nasal mucosa, tympanic membranes clear and intact bilaterally, normal oropharynx, no cervical lymphadenopathy Eye: normal EOM, normal conjunctiva and lids CV:  no edema, +2 pedal pulses, regular rate and rhythm, S1-S2   Resp: no accessory muscle use, non-labored, clear to auscultation bilaterally, no crackles or wheezes Skin: no rashes, no areas of  induration  Neuro: normal tone, normal sensation to touch Psych:  normal insight, alert and oriented MSK: Normal gait, normal strength       ASSESSMENT & PLAN:   Fatigue Unclear as to the source of her fatigue.  Nothing suggestive on history.  Does have a history of anemia. -TSH, CMP, CBC,   Trouble breathing Reports having trouble breathing with no specific association.  No prior history of asthma or tobacco use. -Chest x-ray.

## 2019-01-17 NOTE — Patient Instructions (Signed)
Nice to meet you  I will call you with the results from today  Please follow up if your symptoms continue or worsen.

## 2019-01-20 ENCOUNTER — Telehealth: Payer: Self-pay | Admitting: Family Medicine

## 2019-01-20 NOTE — Telephone Encounter (Signed)
Left VM for patient. If she calls back please have her speak with a nurse/CMA and inform that her bloodwork doesn't show a specific cause of her symptoms. If they continue then she should follow up with her primary doctor. The [facility] can report results to patient.   If any questions then please take the best time and phone number to call and I will try to call her back.   Myra Rude, MD [facility] 01/20/2019, 8:56 AM

## 2019-01-20 NOTE — Telephone Encounter (Signed)
Pt. Given Dr. Jordan Likes message and verbalizes understanding. Appointment made for tomorrow with her PCP.

## 2019-01-21 ENCOUNTER — Other Ambulatory Visit (INDEPENDENT_AMBULATORY_CARE_PROVIDER_SITE_OTHER): Payer: BLUE CROSS/BLUE SHIELD

## 2019-01-21 ENCOUNTER — Encounter: Payer: Self-pay | Admitting: Internal Medicine

## 2019-01-21 ENCOUNTER — Ambulatory Visit: Payer: BLUE CROSS/BLUE SHIELD | Admitting: Internal Medicine

## 2019-01-21 VITALS — BP 122/84 | HR 84 | Temp 98.8°F | Ht 66.25 in | Wt 194.0 lb

## 2019-01-21 DIAGNOSIS — K219 Gastro-esophageal reflux disease without esophagitis: Secondary | ICD-10-CM | POA: Diagnosis not present

## 2019-01-21 DIAGNOSIS — R5383 Other fatigue: Secondary | ICD-10-CM | POA: Diagnosis not present

## 2019-01-21 DIAGNOSIS — M545 Low back pain, unspecified: Secondary | ICD-10-CM | POA: Insufficient documentation

## 2019-01-21 LAB — VITAMIN D 25 HYDROXY (VIT D DEFICIENCY, FRACTURES): VITD: 27.26 ng/mL — ABNORMAL LOW (ref 30.00–100.00)

## 2019-01-21 LAB — HEMOGLOBIN A1C: Hgb A1c MFr Bld: 6.1 % (ref 4.6–6.5)

## 2019-01-21 LAB — VITAMIN B12: Vitamin B-12: 430 pg/mL (ref 211–911)

## 2019-01-21 MED ORDER — PANTOPRAZOLE SODIUM 40 MG PO TBEC: 40.0000 mg | DELAYED_RELEASE_TABLET | Freq: Every day | ORAL | 3 refills | Status: DC

## 2019-01-21 MED ORDER — METHYLPREDNISOLONE ACETATE 40 MG/ML IJ SUSP
40.0000 mg | Freq: Once | INTRAMUSCULAR | Status: AC
  Administered 2019-01-21: 40 mg via INTRAMUSCULAR

## 2019-01-21 NOTE — Progress Notes (Signed)
   Subjective:   Patient ID: Tami Martin, female    DOB: 1971-03-08, 48 y.o.   MRN: [id number]  HPI The patient is a 48 YO female coming in for several concerns including: fatigue (going on for about 1 month, seen about 4 days ago but someone else at the office and they did some blood work which was unrevealing, also ordered chest x-ray which she did not get done, having some SOB with this which comes on and lasts for about 20 minutes) and abdominal pain (has history of GERD, used to take protonix but she has not had this in some time, does have pain in upper stomach and burning in her throat, also has some episodes of pain in the low chest with some SOB and pain which clears within 30 minutes, seems to be caused by eating sometimes, has not been eating much in the last month or so, weight is stable) and low back pain (worse in the last several weeks, has had this before, has not tried anything for it, 5/10 pain).   Review of Systems  Constitutional: Positive for activity change and fatigue. Negative for appetite change, chills, fever and unexpected weight change.  HENT: Negative.   Eyes: Negative.   Respiratory: Positive for shortness of breath. Negative for cough and chest tightness.   Cardiovascular: Negative.  Negative for chest pain, palpitations and leg swelling.  Gastrointestinal: Positive for abdominal pain. Negative for abdominal distention, anal bleeding, blood in stool, constipation, diarrhea, nausea, rectal pain and vomiting.       GERD  Musculoskeletal: Positive for arthralgias, back pain and myalgias. Negative for gait problem and joint swelling.  Skin: Negative.   Neurological: Negative.   Psychiatric/Behavioral: Negative.     Objective:  Physical Exam Constitutional:      Appearance: She is well-developed.  HENT:     Head: Normocephalic and atraumatic.  Neck:     Musculoskeletal: Normal range of motion.  Cardiovascular:     Rate and Rhythm: Normal rate and  regular rhythm.  Pulmonary:     Effort: Pulmonary effort is normal. No respiratory distress.     Breath sounds: Normal breath sounds. No wheezing or rales.  Abdominal:     General: Bowel sounds are normal. There is no distension.     Palpations: Abdomen is soft.     Tenderness: There is no abdominal tenderness. There is no rebound.  Musculoskeletal:        General: Tenderness present.  Skin:    General: Skin is warm and dry.  Neurological:     Mental Status: She is alert and oriented to person, place, and time.     Coordination: Coordination normal.     Vitals:   01/21/19 1331  BP: 122/84  Pulse: 84  Temp: 98.8 F (37.1 C)  TempSrc: Oral  SpO2: 97%  Weight: 194 lb (88 kg)  Height: 5' 6.25" (1.683 m)    Assessment & Plan:  Depo-medrol 40 mg IM given at visit

## 2019-01-21 NOTE — Assessment & Plan Note (Signed)
Checking B12 and vitamin D and HgA1c as these were not checked. Some of the changes in diet are likely from GERD.

## 2019-01-21 NOTE — Assessment & Plan Note (Signed)
Stopped taking protonix and needs refill. Cannot recall when she ran out. Having symptoms.

## 2019-01-21 NOTE — Patient Instructions (Signed)
We have given you a shot today to help the back pain. This should work within 3-4 days.   We have sent in a refill of the medicine for the stomach called protonix. Take 1 pill daily and this should help in 1-2 weeks.   We are checking some other blood work today and will call you back about the results.

## 2019-01-21 NOTE — Assessment & Plan Note (Signed)
Depo-medrol 40 mg IM given at visit.

## 2019-06-27 ENCOUNTER — Other Ambulatory Visit (HOSPITAL_BASED_OUTPATIENT_CLINIC_OR_DEPARTMENT_OTHER): Payer: Self-pay | Admitting: Specialist

## 2019-06-27 ENCOUNTER — Other Ambulatory Visit: Payer: Self-pay

## 2019-06-27 ENCOUNTER — Ambulatory Visit (HOSPITAL_BASED_OUTPATIENT_CLINIC_OR_DEPARTMENT_OTHER)
Admission: RE | Admit: 2019-06-27 | Discharge: 2019-06-27 | Disposition: A | Discharge status: Home / Self Care | Payer: BC Managed Care – PPO | Source: Ambulatory Visit | Attending: Specialist | Admitting: Specialist

## 2019-06-27 DIAGNOSIS — M79604 Pain in right leg: Secondary | ICD-10-CM

## 2019-06-27 DIAGNOSIS — M7989 Other specified soft tissue disorders: Secondary | ICD-10-CM

## 2019-11-07 ENCOUNTER — Other Ambulatory Visit: Payer: Self-pay | Admitting: Internal Medicine

## 2019-11-11 ENCOUNTER — Ambulatory Visit: Payer: BC Managed Care – PPO | Admitting: Internal Medicine

## 2019-11-12 ENCOUNTER — Other Ambulatory Visit: Payer: Self-pay | Admitting: Obstetrics & Gynecology

## 2019-11-12 DIAGNOSIS — Z1231 Encounter for screening mammogram for malignant neoplasm of breast: Secondary | ICD-10-CM

## 2019-11-17 ENCOUNTER — Ambulatory Visit (INDEPENDENT_AMBULATORY_CARE_PROVIDER_SITE_OTHER): Payer: BC Managed Care – PPO | Admitting: Internal Medicine

## 2019-11-17 ENCOUNTER — Other Ambulatory Visit (INDEPENDENT_AMBULATORY_CARE_PROVIDER_SITE_OTHER): Payer: BC Managed Care – PPO

## 2019-11-17 ENCOUNTER — Encounter: Payer: Self-pay | Admitting: Internal Medicine

## 2019-11-17 ENCOUNTER — Other Ambulatory Visit: Payer: Self-pay

## 2019-11-17 VITALS — BP 130/88 | HR 76 | Temp 98.8°F | Ht 66.25 in | Wt 200.0 lb

## 2019-11-17 DIAGNOSIS — K219 Gastro-esophageal reflux disease without esophagitis: Secondary | ICD-10-CM

## 2019-11-17 DIAGNOSIS — R7303 Prediabetes: Secondary | ICD-10-CM

## 2019-11-17 DIAGNOSIS — Z0000 Encounter for general adult medical examination without abnormal findings: Secondary | ICD-10-CM

## 2019-11-17 DIAGNOSIS — I10 Essential (primary) hypertension: Secondary | ICD-10-CM | POA: Diagnosis not present

## 2019-11-17 DIAGNOSIS — Z23 Encounter for immunization: Secondary | ICD-10-CM

## 2019-11-17 LAB — CBC
HCT: 42.2 % (ref 36.0–46.0)
Hemoglobin: 14.1 g/dL (ref 12.0–15.0)
MCHC: 33.5 g/dL (ref 30.0–36.0)
MCV: 86.8 fl (ref 78.0–100.0)
Platelets: 248 10*3/uL (ref 150.0–400.0)
RBC: 4.86 Mil/uL (ref 3.87–5.11)
RDW: 12.8 % (ref 11.5–15.5)
WBC: 4.4 10*3/uL (ref 4.0–10.5)

## 2019-11-17 LAB — COMPREHENSIVE METABOLIC PANEL
ALT: 18 U/L (ref 0–35)
AST: 21 U/L (ref 0–37)
Albumin: 3.5 g/dL (ref 3.5–5.2)
Alkaline Phosphatase: 73 U/L (ref 39–117)
BUN: 10 mg/dL (ref 6–23)
CO2: 24 mEq/L (ref 19–32)
Calcium: 8.5 mg/dL (ref 8.4–10.5)
Chloride: 108 mEq/L (ref 96–112)
Creatinine, Ser: 0.61 mg/dL (ref 0.40–1.20)
GFR: 126.16 mL/min (ref >60.00)
Glucose, Bld: 107 mg/dL — ABNORMAL HIGH (ref 70–99)
Potassium: 3.9 mEq/L (ref 3.5–5.1)
Sodium: 140 mEq/L (ref 135–145)
Total Bilirubin: 0.5 mg/dL (ref 0.2–1.2)
Total Protein: 6.5 g/dL (ref 6.0–8.3)

## 2019-11-17 LAB — LIPID PANEL
Cholesterol: 113 mg/dL (ref 0–200)
HDL: 43.7 mg/dL (ref >39.00)
LDL Cholesterol: 57 mg/dL (ref 0–99)
NonHDL: 68.96
Total CHOL/HDL Ratio: 3
Triglycerides: 62 mg/dL (ref 0.0–149.0)
VLDL: 12.4 mg/dL (ref 0.0–40.0)

## 2019-11-17 LAB — HEMOGLOBIN A1C: Hgb A1c MFr Bld: 5.5 % (ref 4.6–6.5)

## 2019-11-17 MED ORDER — PANTOPRAZOLE SODIUM 40 MG PO TBEC: 40.0000 mg | DELAYED_RELEASE_TABLET | Freq: Every day | ORAL | 3 refills | Status: DC

## 2019-11-17 MED ORDER — AMLODIPINE BESYLATE 5 MG PO TABS: 5.0000 mg | ORAL_TABLET | Freq: Every day | ORAL | 3 refills | Status: DC

## 2019-11-17 NOTE — Assessment & Plan Note (Signed)
Checking HgA1c and adjust as needed.  

## 2019-11-17 NOTE — Assessment & Plan Note (Signed)
Refill amlodipine. BP at goal. Checking CMP and adjust as needed.

## 2019-11-17 NOTE — Progress Notes (Signed)
   Subjective:   Patient ID: Tami Martin, female    DOB: 1971/10/12, 48 y.o.   MRN: [id number]  HPI The patient is a 48 YO female coming in for physical. Right knee with injury on workman's comp currently.   PMH, [facility], social history reviewed and updated  Review of Systems  Constitutional: Negative.   HENT: Negative.   Eyes: Negative.   Respiratory: Negative for cough, chest tightness and shortness of breath.   Cardiovascular: Negative for chest pain, palpitations and leg swelling.  Gastrointestinal: Negative for abdominal distention, abdominal pain, constipation, diarrhea, nausea and vomiting.  Musculoskeletal: Positive for arthralgias.  Skin: Negative.   Neurological: Negative.   Psychiatric/Behavioral: Negative.     Objective:  Physical Exam Constitutional:      Appearance: She is well-developed.  HENT:     Head: Normocephalic and atraumatic.  Cardiovascular:     Rate and Rhythm: Normal rate and regular rhythm.  Pulmonary:     Effort: Pulmonary effort is normal. No respiratory distress.     Breath sounds: Normal breath sounds. No wheezing or rales.  Abdominal:     General: Bowel sounds are normal. There is no distension.     Palpations: Abdomen is soft.     Tenderness: There is no abdominal tenderness. There is no rebound.  Musculoskeletal:     Cervical back: Normal range of motion.  Skin:    General: Skin is warm and dry.  Neurological:     Mental Status: She is alert and oriented to person, place, and time.     Coordination: Coordination normal.     Vitals:   11/17/19 0807  BP: 130/88  Pulse: 76  Temp: 98.8 F (37.1 C)  TempSrc: Oral  SpO2: 96%  Weight: 200 lb (90.7 kg)  Height: 5' 6.25" (1.683 m)    This visit occurred during the SARS-CoV-2 public health emergency.  Safety protocols were in place, including screening questions prior to the visit, additional usage of staff PPE, and extensive cleaning of exam room while observing appropriate  contact time as indicated for disinfecting solutions.   Assessment & Plan:  Flu shot given at visit

## 2019-11-17 NOTE — Assessment & Plan Note (Signed)
Flu shot up to date. Tetanus declines. Mammogram with gyn, pap smear with gyn. Counseled about sun safety and mole surveillance. Counseled about the dangers of distracted driving. Given 10 year screening recommendations.

## 2019-11-17 NOTE — Assessment & Plan Note (Signed)
Refill protonix 

## 2019-11-17 NOTE — Patient Instructions (Signed)
Health Maintenance, Female Adopting a healthy lifestyle and getting preventive care are important in promoting health and wellness. Ask your health care provider about:  The right schedule for you to have regular tests and exams.  Things you can do on your own to prevent diseases and keep yourself healthy. What should I know about diet, weight, and exercise? Eat a healthy diet   Eat a diet that includes plenty of vegetables, fruits, low-fat dairy products, and lean protein.  Do not eat a lot of foods that are high in solid fats, added sugars, or sodium. Maintain a healthy weight Body mass index (BMI) is used to identify weight problems. It estimates body fat based on height and weight. Your health care provider can help determine your BMI and help you achieve or maintain a healthy weight. Get regular exercise Get regular exercise. This is one of the most important things you can do for your health. Most adults should:  Exercise for at least 150 minutes each week. The exercise should increase your heart rate and make you sweat (moderate-intensity exercise).  Do strengthening exercises at least twice a week. This is in addition to the moderate-intensity exercise.  Spend less time sitting. Even light physical activity can be beneficial. Watch cholesterol and blood lipids Have your blood tested for lipids and cholesterol at 48 years of age, then have this test every 5 years. Have your cholesterol levels checked more often if:  Your lipid or cholesterol levels are high.  You are older than 48 years of age.  You are at high risk for heart disease. What should I know about cancer screening? Depending on your health history and family history, you may need to have cancer screening at various ages. This may include screening for:  Breast cancer.  Cervical cancer.  Colorectal cancer.  Skin cancer.  Lung cancer. What should I know about heart disease, diabetes, and high blood  pressure? Blood pressure and heart disease  High blood pressure causes heart disease and increases the risk of stroke. This is more likely to develop in people who have high blood pressure readings, are of African descent, or are overweight.  Have your blood pressure checked: ? Every 3-5 years if you are 18-39 years of age. ? Every year if you are 40 years old or older. Diabetes Have regular diabetes screenings. This checks your fasting blood sugar level. Have the screening done:  Once every three years after age 40 if you are at a normal weight and have a low risk for diabetes.  More often and at a younger age if you are overweight or have a high risk for diabetes. What should I know about preventing infection? Hepatitis B If you have a higher risk for hepatitis B, you should be screened for this virus. Talk with your health care provider to find out if you are at risk for hepatitis B infection. Hepatitis C Testing is recommended for:  Everyone born from 1945 through 1965.  Anyone with known risk factors for hepatitis C. Sexually transmitted infections (STIs)  Get screened for STIs, including gonorrhea and chlamydia, if: ? You are sexually active and are younger than 48 years of age. ? You are older than 48 years of age and your health care provider tells you that you are at risk for this type of infection. ? Your sexual activity has changed since you were last screened, and you are at increased risk for chlamydia or gonorrhea. Ask your health care provider if   you are at risk.  Ask your health care provider about whether you are at high risk for HIV. Your health care provider may recommend a prescription medicine to help prevent HIV infection. If you choose to take medicine to prevent HIV, you should first get tested for HIV. You should then be tested every 3 months for as long as you are taking the medicine. Pregnancy  If you are about to stop having your period (premenopausal) and  you may become pregnant, seek counseling before you get pregnant.  Take 400 to 800 micrograms (mcg) of folic acid every day if you become pregnant.  Ask for birth control (contraception) if you want to prevent pregnancy. Osteoporosis and menopause Osteoporosis is a disease in which the bones lose minerals and strength with aging. This can result in bone fractures. If you are 65 years old or older, or if you are at risk for osteoporosis and fractures, ask your health care provider if you should:  Be screened for bone loss.  Take a calcium or vitamin D supplement to lower your risk of fractures.  Be given hormone replacement therapy (HRT) to treat symptoms of menopause. Follow these instructions at home: Lifestyle  Do not use any products that contain nicotine or tobacco, such as cigarettes, e-cigarettes, and chewing tobacco. If you need help quitting, ask your health care provider.  Do not use street drugs.  Do not share needles.  Ask your health care provider for help if you need support or information about quitting drugs. Alcohol use  Do not drink alcohol if: ? Your health care provider tells you not to drink. ? You are pregnant, may be pregnant, or are planning to become pregnant.  If you drink alcohol: ? Limit how much you use to 0-1 drink a day. ? Limit intake if you are breastfeeding.  Be aware of how much alcohol is in your drink. In the [place] one drink equals one 12 oz bottle of beer (355 mL), one 5 oz glass of wine (148 mL), or one 1 oz glass of hard liquor (44 mL). General instructions  Schedule regular health, dental, and eye exams.  Stay current with your vaccines.  Tell your health care provider if: ? You often feel depressed. ? You have ever been abused or do not feel safe at home. Summary  Adopting a healthy lifestyle and getting preventive care are important in promoting health and wellness.  Follow your health care provider's instructions about healthy  diet, exercising, and getting tested or screened for diseases.  Follow your health care provider's instructions on monitoring your cholesterol and blood pressure. This information is not intended to replace advice given to you by your health care provider. Make sure you discuss any questions you have with your health care provider. Document Released: 06/05/2011 Document Revised: 11/13/2018 Document Reviewed: 11/13/2018 Elsevier Patient Education  2020 [employer]  

## 2019-12-17 ENCOUNTER — Telehealth: Payer: Self-pay

## 2019-12-17 NOTE — Telephone Encounter (Signed)
Pt dropped off forms from [facility] for surgical clearance. Pt states no date has been st yet for surgery until forms are completed and faxed to [facility].

## 2019-12-18 NOTE — Telephone Encounter (Signed)
Form has been placed in PCP folder.

## 2020-01-05 ENCOUNTER — Other Ambulatory Visit: Payer: Self-pay

## 2020-01-05 ENCOUNTER — Ambulatory Visit
Admission: RE | Admit: 2020-01-05 | Discharge: 2020-01-05 | Disposition: A | Discharge status: Home / Self Care | Payer: BC Managed Care – PPO | Source: Ambulatory Visit | Attending: Obstetrics & Gynecology | Admitting: Obstetrics & Gynecology

## 2020-01-05 DIAGNOSIS — Z1231 Encounter for screening mammogram for malignant neoplasm of breast: Secondary | ICD-10-CM

## 2020-02-17 IMAGING — MG DIGITAL SCREENING BILATERAL MAMMOGRAM WITH TOMO AND CAD
6 of 10 series · 6 of 30 positions shown · non-contrast
Comparison: Previous exam(s).

CLINICAL DATA: Screening.

EXAM:
DIGITAL SCREENING BILATERAL MAMMOGRAM WITH TOMO AND CAD

[R CC synth-2D (1 of 2)]
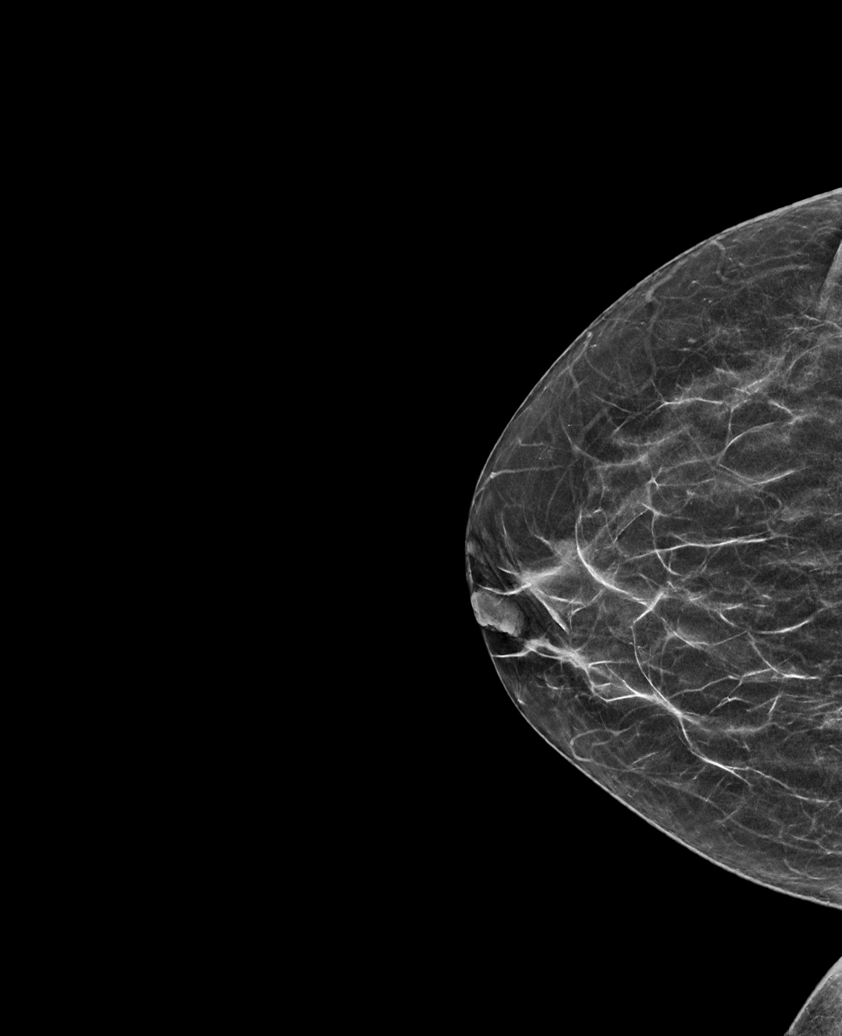

[L CC synth-2D]
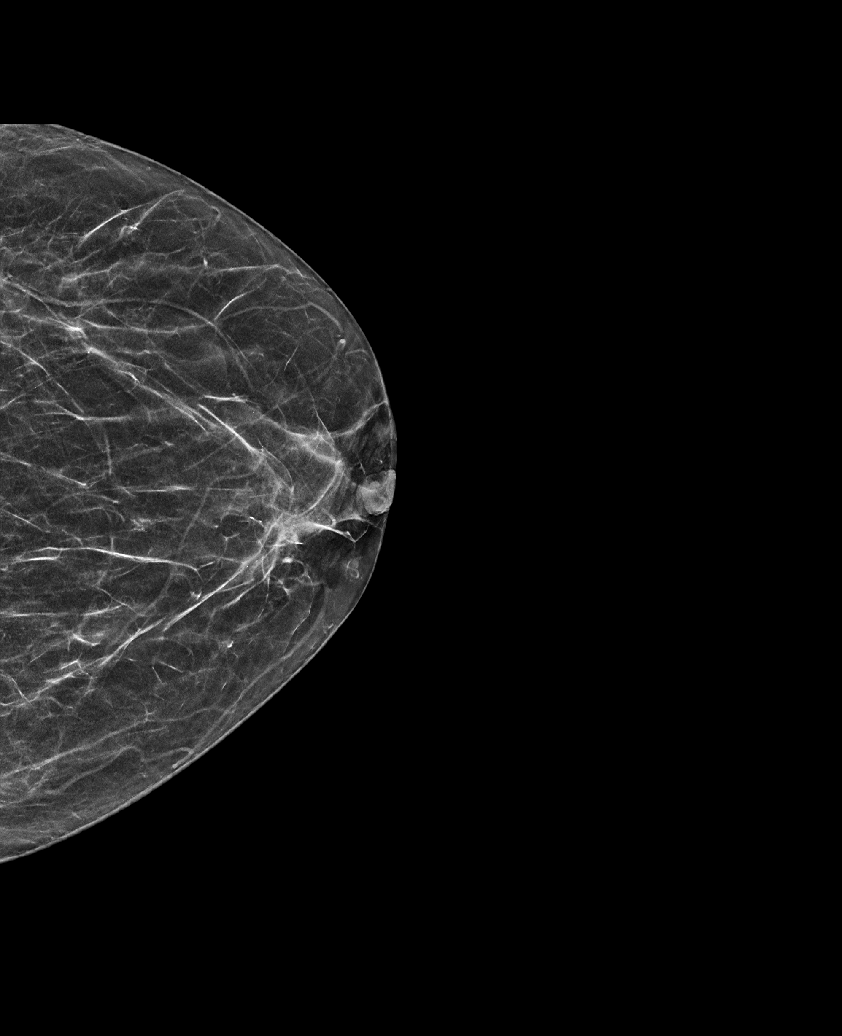

[L MLO synth-2D]
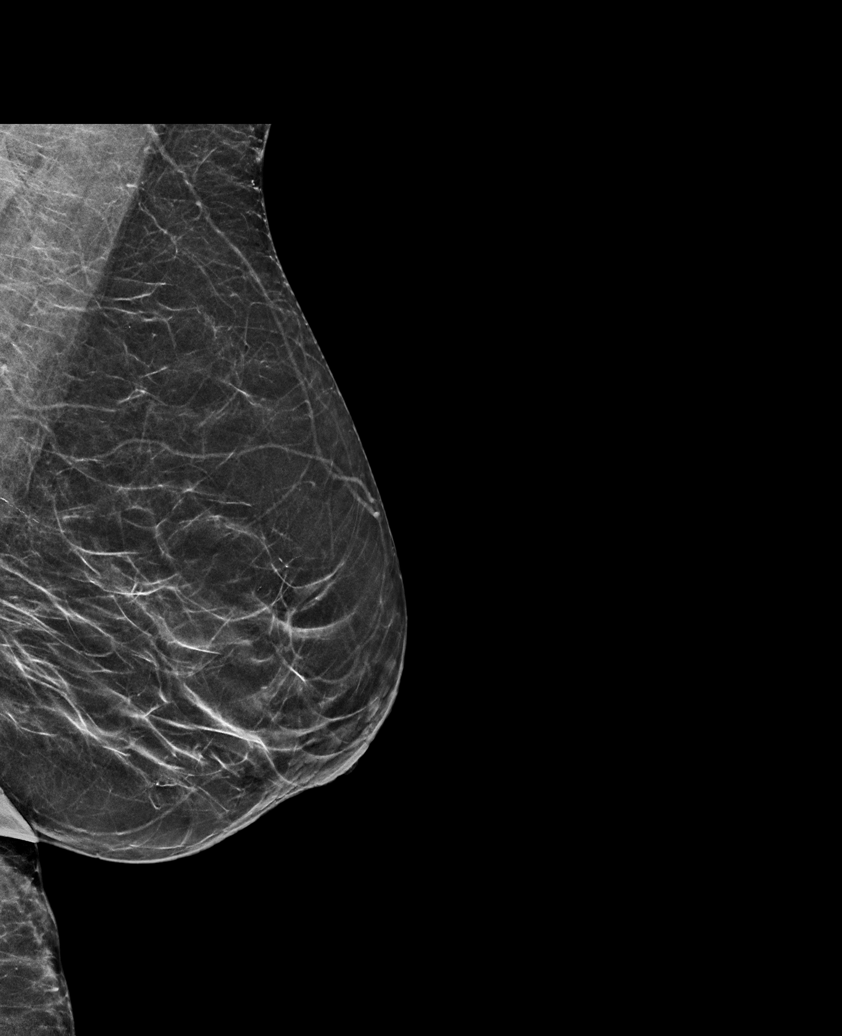

[R CC synth-2D (2 of 2)]
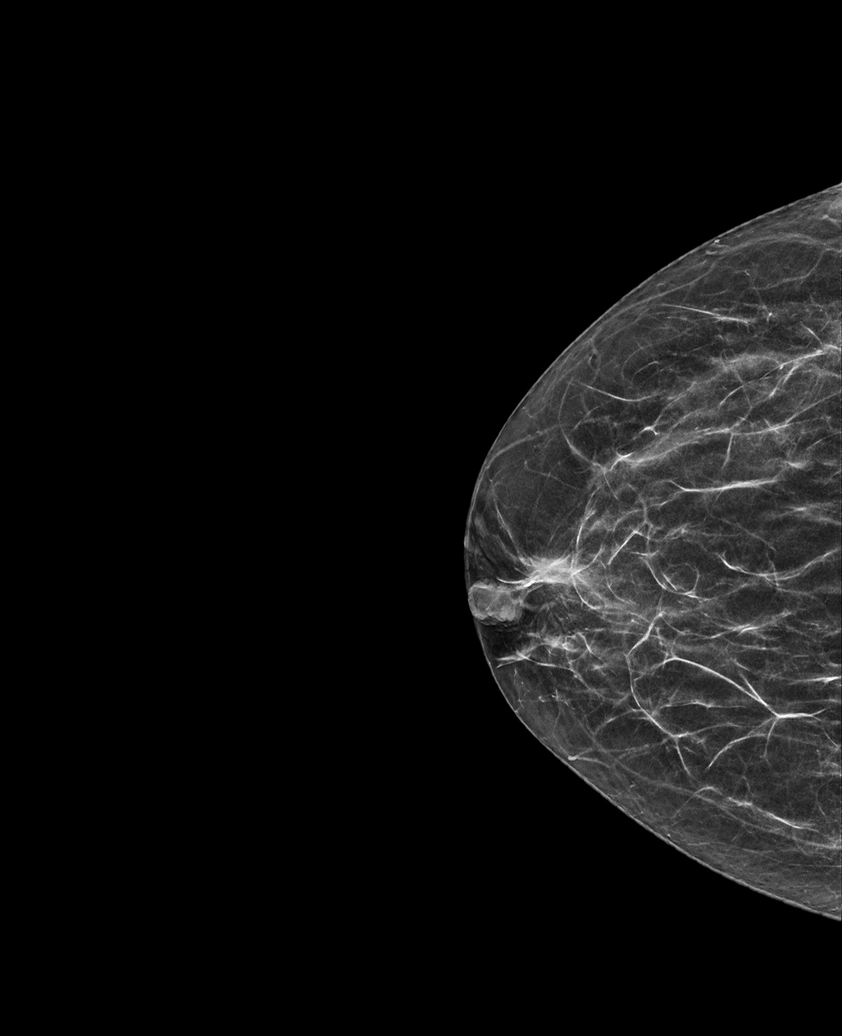

[R MLO synth-2D]
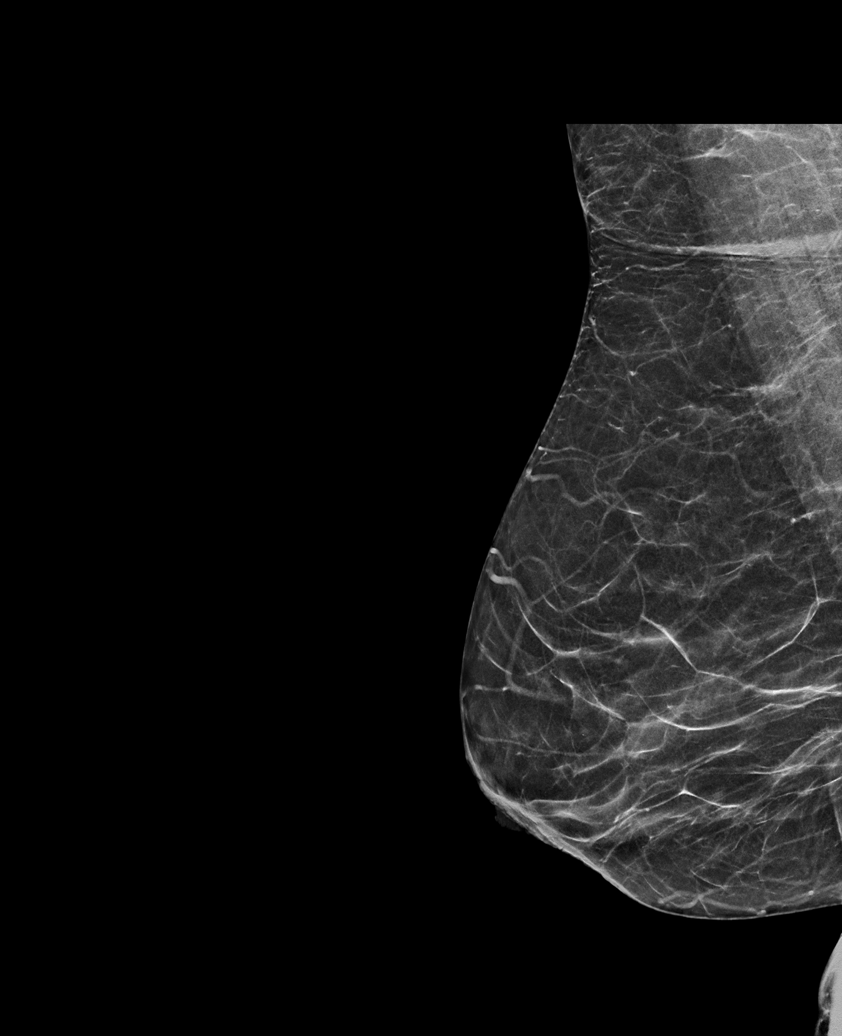

[L CC tomo · tomo slice 25/48.0]
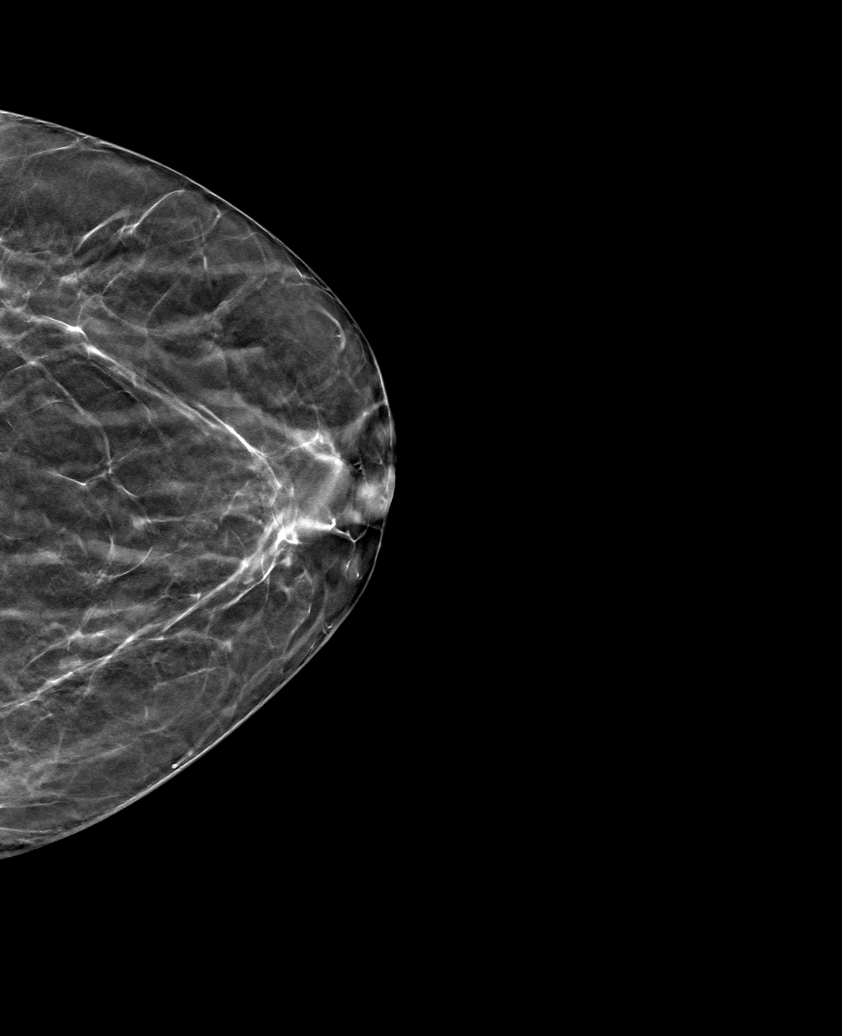

[6 of 30 positions shown; findings below may reference images not displayed]

ACR Breast Density Category b: There are scattered areas of
fibroglandular density.
FINDINGS: In the right breast, a possible asymmetry warrants further
evaluation. In the left breast, no findings suspicious for
malignancy. Images were processed with CAD.
IMPRESSION: Further evaluation is suggested for possible asymmetry in the right
breast.

RECOMMENDATION:
Diagnostic mammogram and possibly ultrasound of the right breast.
(Code:[id number])

The patient will be contacted regarding the findings, and additional
imaging will be scheduled.

BI-RADS CATEGORY  0: Incomplete. Need additional imaging evaluation
and/or prior mammograms for comparison.

## 2020-09-06 ENCOUNTER — Other Ambulatory Visit: Payer: Self-pay

## 2020-09-06 ENCOUNTER — Ambulatory Visit (INDEPENDENT_AMBULATORY_CARE_PROVIDER_SITE_OTHER): Payer: BC Managed Care – PPO

## 2020-09-06 ENCOUNTER — Encounter: Payer: Self-pay | Admitting: Family

## 2020-09-06 ENCOUNTER — Ambulatory Visit (INDEPENDENT_AMBULATORY_CARE_PROVIDER_SITE_OTHER): Payer: BC Managed Care – PPO | Admitting: Family

## 2020-09-06 VITALS — BP 140/74 | HR 74 | Temp 98.5°F | Ht 66.25 in | Wt 191.0 lb

## 2020-09-06 DIAGNOSIS — M545 Low back pain, unspecified: Secondary | ICD-10-CM

## 2020-09-06 MED ORDER — MELOXICAM 15 MG PO TABS: 15.0000 mg | ORAL_TABLET | Freq: Every day | ORAL | 0 refills | Status: DC

## 2020-09-06 NOTE — Progress Notes (Signed)
Tami Martin is a 49 y.o. female with the following history as recorded in [employer]:  Patient Active Problem List   Diagnosis Date Noted  . Pes planus 10/11/2017  . Prediabetes 08/29/2016  . GERD (gastroesophageal reflux disease) 08/29/2016  . Headache 01/22/2015  . Routine general medical examination at a health care facility 09/11/2014  . HTN (hypertension) 09/11/2013  . Allergic rhinitis 06/10/2013  . 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 amLODipine (NORVASC) 5 MG tablet Take 1 tablet (5 mg total) by mouth daily. 90 tablet 3  . Calcium Carbonate-Vit D-Min 1200-1000 MG-UNIT CHEW Chew 1 capsule by mouth daily.    . cetirizine (ZYRTEC) 10 MG tablet Take 1 tablet (10 mg total) by mouth daily. 30 tablet 11  . Multiple Vitamin (MULTIVITAMIN WITH MINERALS) TABS tablet Take 1 tablet by mouth daily.    . pantoprazole (PROTONIX) 40 MG tablet Take 1 tablet (40 mg total) by mouth daily. 90 tablet 3  . SUMAtriptan (IMITREX) 100 MG tablet Take 1 tablet (100 mg total) by mouth every 2 (two) hours as needed for migraine or headache. May repeat in 2 hours if headache persists or recurs. 10 tablet 0  . meloxicam (MOBIC) 15 MG tablet Take 1 tablet (15 mg total) by mouth daily. 30 tablet 0   No current facility-administered medications for this visit.    Allergies: Patient has no known allergies.  Past Medical History:  Diagnosis Date  . Allergy    Seasonal allergies   . DDD (degenerative disc disease), lumbosacral 2011  . GERD (gastroesophageal reflux disease)    denies today 07/2016  . Prediabetes 2014    Past Surgical History:  Procedure Laterality Date  . KNEE SURGERY Right 2020    History reviewed. No pertinent family history.  Social History   Tobacco Use  . Smoking status: Never Smoker  . Smokeless tobacco: Never Used  Substance Use Topics  . Alcohol use: Yes    Comment: once month-wine    Subjective:  Low  back pain x 2-3 weeks; no known injury or trauma- notes that she has seen a back specialist in the distant past and at one point was considering surgery; notes it is painful to sit or stand for extended periods of time; no numbness or tingling; is still having complications from recent knee surgery- scheduled to see her orthopedist next week for follow-up there;   LMP-irregular/ no concerns for pregnancy   Objective:  Vitals:   09/06/20 1327  BP: 140/74  Pulse: 74  Temp: 98.5 F (36.9 C)  TempSrc: Oral  SpO2: 99%  Weight: 191 lb (86.6 kg)  Height: 5' 6.25" (1.683 m)    General: Well developed, well nourished, in no acute distress  Head: Normocephalic and atraumatic  Lungs: Respirations unlabored;  Musculoskeletal: No deformities; no active joint inflammation  Extremities: No edema, cyanosis, clubbing  Vessels: Symmetric bilaterally  Neurologic: Alert and oriented; speech intact; face symmetrical; moves all extremities well; CNII-XII intact without focal deficit   Assessment:  1. Low back pain, unspecified back pain laterality, unspecified chronicity, unspecified whether sciatica present     Plan:  ? If patient is compensating for her knee pain and has aggravated her back as a result; will update x-ray today and trial of Mobic 15 mg daily; follow-up to be determined- will most likely have her see her orthopedist in follow-up;  Stressed need to keep planned follow-up for CPE with her PCP  for December;  This visit occurred during the SARS-CoV-2 public health emergency.  Safety protocols were in place, including screening questions prior to the visit, additional usage of staff PPE, and extensive cleaning of exam room while observing appropriate contact time as indicated for disinfecting solutions.     No follow-ups on file.  Orders Placed This Encounter  Procedures  . DG Lumbar Spine Complete    Standing Status:   Future    Number of Occurrences:   1    Standing Expiration  Date:   09/06/2021    Order Specific Question:   Reason for Exam (SYMPTOM  OR DIAGNOSIS REQUIRED)    Answer:   low back pain    Order Specific Question:   Is patient pregnant?    Answer:   No    Order Specific Question:   Preferred imaging location?    Answer:   [facility]    Requested Prescriptions   Signed Prescriptions Disp Refills  . meloxicam (MOBIC) 15 MG tablet 30 tablet 0    Sig: Take 1 tablet (15 mg total) by mouth daily.

## 2020-11-17 ENCOUNTER — Other Ambulatory Visit: Payer: Self-pay

## 2020-11-17 ENCOUNTER — Ambulatory Visit (INDEPENDENT_AMBULATORY_CARE_PROVIDER_SITE_OTHER): Payer: BC Managed Care – PPO | Admitting: Internal Medicine

## 2020-11-17 ENCOUNTER — Encounter: Payer: Self-pay | Admitting: Internal Medicine

## 2020-11-17 VITALS — BP 110/78 | HR 77 | Temp 99.0°F | Resp 14 | Ht 66.25 in | Wt 198.2 lb

## 2020-11-17 DIAGNOSIS — Z23 Encounter for immunization: Secondary | ICD-10-CM | POA: Diagnosis not present

## 2020-11-17 DIAGNOSIS — M5432 Sciatica, left side: Secondary | ICD-10-CM | POA: Diagnosis not present

## 2020-11-17 DIAGNOSIS — Z0000 Encounter for general adult medical examination without abnormal findings: Secondary | ICD-10-CM | POA: Diagnosis not present

## 2020-11-17 DIAGNOSIS — K219 Gastro-esophageal reflux disease without esophagitis: Secondary | ICD-10-CM

## 2020-11-17 DIAGNOSIS — Z124 Encounter for screening for malignant neoplasm of cervix: Secondary | ICD-10-CM

## 2020-11-17 DIAGNOSIS — R7303 Prediabetes: Secondary | ICD-10-CM | POA: Diagnosis not present

## 2020-11-17 DIAGNOSIS — I10 Essential (primary) hypertension: Secondary | ICD-10-CM

## 2020-11-17 LAB — LIPID PANEL
Cholesterol: 132 mg/dL (ref 0–200)
HDL: 55.3 mg/dL (ref >39.00)
LDL Cholesterol: 59 mg/dL (ref 0–99)
NonHDL: 76.84
Total CHOL/HDL Ratio: 2
Triglycerides: 87 mg/dL (ref 0.0–149.0)
VLDL: 17.4 mg/dL (ref 0.0–40.0)

## 2020-11-17 LAB — COMPREHENSIVE METABOLIC PANEL
ALT: 24 U/L (ref 0–35)
AST: 27 U/L (ref 0–37)
Albumin: 3.7 g/dL (ref 3.5–5.2)
Alkaline Phosphatase: 77 U/L (ref 39–117)
BUN: 10 mg/dL (ref 6–23)
CO2: 25 mEq/L (ref 19–32)
Calcium: 8.9 mg/dL (ref 8.4–10.5)
Chloride: 105 mEq/L (ref 96–112)
Creatinine, Ser: 0.65 mg/dL (ref 0.40–1.20)
GFR: 103 mL/min (ref >60.00)
Glucose, Bld: 128 mg/dL — ABNORMAL HIGH (ref 70–99)
Potassium: 4.2 mEq/L (ref 3.5–5.1)
Sodium: 137 mEq/L (ref 135–145)
Total Bilirubin: 0.7 mg/dL (ref 0.2–1.2)
Total Protein: 7 g/dL (ref 6.0–8.3)

## 2020-11-17 LAB — HEMOGLOBIN A1C: Hgb A1c MFr Bld: 6 % (ref 4.6–6.5)

## 2020-11-17 LAB — CBC
HCT: 45.7 % (ref 36.0–46.0)
Hemoglobin: 14.7 g/dL (ref 12.0–15.0)
MCHC: 32.2 g/dL (ref 30.0–36.0)
MCV: 87.6 fl (ref 78.0–100.0)
Platelets: 241 10*3/uL (ref 150.0–400.0)
RBC: 5.21 Mil/uL — ABNORMAL HIGH (ref 3.87–5.11)
RDW: 13 % (ref 11.5–15.5)
WBC: 4.2 10*3/uL (ref 4.0–10.5)

## 2020-11-17 MED ORDER — MELOXICAM 15 MG PO TABS: 15.0000 mg | ORAL_TABLET | Freq: Every day | ORAL | 1 refills | Status: DC

## 2020-11-17 MED ORDER — AMLODIPINE BESYLATE 5 MG PO TABS: 5.0000 mg | ORAL_TABLET | Freq: Every day | ORAL | 3 refills | Status: DC

## 2020-11-17 MED ORDER — PANTOPRAZOLE SODIUM 40 MG PO TBEC: 40.0000 mg | DELAYED_RELEASE_TABLET | Freq: Every day | ORAL | 3 refills | Status: DC

## 2020-11-17 NOTE — Progress Notes (Signed)
   Subjective:   Patient ID: Tami Martin, female    DOB: 01/24/1971, 49 y.o.   MRN: [id number]  HPI The patient is a 49 YO female coming in for physical. Still having problems with right knee and getting injection in that 1-2 weeks from now.  PMH, [facility], social history reviewed and updated.   Review of Systems  Constitutional: Negative.   HENT: Negative.   Eyes: Negative.   Respiratory: Negative for cough, chest tightness and shortness of breath.   Cardiovascular: Negative for chest pain, palpitations and leg swelling.  Gastrointestinal: Negative for abdominal distention, abdominal pain, constipation, diarrhea, nausea and vomiting.  Musculoskeletal: Negative.   Skin: Negative.   Neurological: Negative.   Psychiatric/Behavioral: Negative.     Objective:  Physical Exam Constitutional:      Appearance: She is well-developed and well-nourished.  HENT:     Head: Normocephalic and atraumatic.  Eyes:     Extraocular Movements: EOM normal.  Cardiovascular:     Rate and Rhythm: Normal rate and regular rhythm.  Pulmonary:     Effort: Pulmonary effort is normal. No respiratory distress.     Breath sounds: Normal breath sounds. No wheezing or rales.  Abdominal:     General: Bowel sounds are normal. There is no distension.     Palpations: Abdomen is soft.     Tenderness: There is no abdominal tenderness. There is no rebound.  Musculoskeletal:        General: Tenderness present. No edema.     Cervical back: Normal range of motion.     Comments: Right knee with scar and tenderness to palpation  Skin:    General: Skin is warm and dry.  Neurological:     Mental Status: She is alert and oriented to person, place, and time.     Coordination: Coordination normal.  Psychiatric:        Mood and Affect: Mood and affect normal.     Vitals:   11/17/20 0813  BP: 110/78  Pulse: 77  Resp: 14  Temp: 99 F (37.2 C)  TempSrc: Oral  SpO2: 99%  Weight: 198 lb 3.2 oz (89.9 kg)   Height: 5' 6.25" (1.683 m)    This visit occurred during the SARS-CoV-2 public health emergency.  Safety protocols were in place, including screening questions prior to the visit, additional usage of staff PPE, and extensive cleaning of exam room while observing appropriate contact time as indicated for disinfecting solutions.   Assessment & Plan:  Flu shot and Tdap given at visit

## 2020-11-17 NOTE — Assessment & Plan Note (Signed)
Checking HgA1c and if still low will remove problem. She has maintained her weight loss.

## 2020-11-17 NOTE — Assessment & Plan Note (Signed)
Flu shot given. Covid-19 2 shots encouraged booster. Tetanus given. Mammogram up to date, pap smear referral to ob/gyn for this. Counseled about sun safety and mole surveillance. Counseled about the dangers of distracted driving. Given 10 year screening recommendations.

## 2020-11-17 NOTE — Assessment & Plan Note (Signed)
BP at goal on amlodipine. Checking CMP and adjust as needed.  

## 2020-11-17 NOTE — Patient Instructions (Addendum)
We have refilled the medicine for the pain in the back and the stomach medicine.   You are due for a pap smear so we will get you in for that.   Health Maintenance, Female Adopting a healthy lifestyle and getting preventive care are important in promoting health and wellness. Ask your health care provider about:  The right schedule for you to have regular tests and exams.  Things you can do on your own to prevent diseases and keep yourself healthy. What should I know about diet, weight, and exercise? Eat a healthy diet   Eat a diet that includes plenty of vegetables, fruits, low-fat dairy products, and lean protein.  Do not eat a lot of foods that are high in solid fats, added sugars, or sodium. Maintain a healthy weight Body mass index (BMI) is used to identify weight problems. It estimates body fat based on height and weight. Your health care provider can help determine your BMI and help you achieve or maintain a healthy weight. Get regular exercise Get regular exercise. This is one of the most important things you can do for your health. Most adults should:  Exercise for at least 150 minutes each week. The exercise should increase your heart rate and make you sweat (moderate-intensity exercise).  Do strengthening exercises at least twice a week. This is in addition to the moderate-intensity exercise.  Spend less time sitting. Even light physical activity can be beneficial. Watch cholesterol and blood lipids Have your blood tested for lipids and cholesterol at 49 years of age, then have this test every 5 years. Have your cholesterol levels checked more often if:  Your lipid or cholesterol levels are high.  You are older than 49 years of age.  You are at high risk for heart disease. What should I know about cancer screening? Depending on your health history and family history, you may need to have cancer screening at various ages. This may include screening for:  Breast  cancer.  Cervical cancer.  Colorectal cancer.  Skin cancer.  Lung cancer. What should I know about heart disease, diabetes, and high blood pressure? Blood pressure and heart disease  High blood pressure causes heart disease and increases the risk of stroke. This is more likely to develop in people who have high blood pressure readings, are of African descent, or are overweight.  Have your blood pressure checked: ? Every 3-5 years if you are 33-35 years of age. ? Every year if you are 8 years old or older. Diabetes Have regular diabetes screenings. This checks your fasting blood sugar level. Have the screening done:  Once every three years after age 28 if you are at a normal weight and have a low risk for diabetes.  More often and at a younger age if you are overweight or have a high risk for diabetes. What should I know about preventing infection? Hepatitis B If you have a higher risk for hepatitis B, you should be screened for this virus. Talk with your health care provider to find out if you are at risk for hepatitis B infection. Hepatitis C Testing is recommended for:  Everyone born from [birth year] through 1965.  Anyone with known risk factors for hepatitis C. Sexually transmitted infections (STIs)  Get screened for STIs, including gonorrhea and chlamydia, if: ? You are sexually active and are younger than 49 years of age. ? You are older than 49 years of age and your health care provider tells you that you are  at risk for this type of infection. ? Your sexual activity has changed since you were last screened, and you are at increased risk for chlamydia or gonorrhea. Ask your health care provider if you are at risk.  Ask your health care provider about whether you are at high risk for HIV. Your health care provider may recommend a prescription medicine to help prevent HIV infection. If you choose to take medicine to prevent HIV, you should first get tested for HIV. You should  then be tested every 3 months for as long as you are taking the medicine. Pregnancy  If you are about to stop having your period (premenopausal) and you may become pregnant, seek counseling before you get pregnant.  Take 400 to 800 micrograms (mcg) of folic acid every day if you become pregnant.  Ask for birth control (contraception) if you want to prevent pregnancy. Osteoporosis and menopause Osteoporosis is a disease in which the bones lose minerals and strength with aging. This can result in bone fractures. If you are 51 years old or older, or if you are at risk for osteoporosis and fractures, ask your health care provider if you should:  Be screened for bone loss.  Take a calcium or vitamin D supplement to lower your risk of fractures.  Be given hormone replacement therapy (HRT) to treat symptoms of menopause. Follow these instructions at home: Lifestyle  Do not use any products that contain nicotine or tobacco, such as cigarettes, e-cigarettes, and chewing tobacco. If you need help quitting, ask your health care provider.  Do not use street drugs.  Do not share needles.  Ask your health care provider for help if you need support or information about quitting drugs. Alcohol use  Do not drink alcohol if: ? Your health care provider tells you not to drink. ? You are pregnant, may be pregnant, or are planning to become pregnant.  If you drink alcohol: ? Limit how much you use to 0-1 drink a day. ? Limit intake if you are breastfeeding.  Be aware of how much alcohol is in your drink. In the [place] one drink equals one 12 oz bottle of beer (355 mL), one 5 oz glass of wine (148 mL), or one 1 oz glass of hard liquor (44 mL). General instructions  Schedule regular health, dental, and eye exams.  Stay current with your vaccines.  Tell your health care provider if: ? You often feel depressed. ? You have ever been abused or do not feel safe at home. Summary  Adopting a  healthy lifestyle and getting preventive care are important in promoting health and wellness.  Follow your health care provider's instructions about healthy diet, exercising, and getting tested or screened for diseases.  Follow your health care provider's instructions on monitoring your cholesterol and blood pressure. This information is not intended to replace advice given to you by your health care provider. Make sure you discuss any questions you have with your health care provider. Document Revised: 11/13/2018 Document Reviewed: 11/13/2018 Elsevier Patient Education  2020 [employer].

## 2020-11-17 NOTE — Assessment & Plan Note (Signed)
Refilled protonix which is mostly controlling symptoms.

## 2020-11-17 NOTE — Assessment & Plan Note (Signed)
Refill meloxicam and encouraged to continue follow up with right knee as that is likely source of low back pain.

## 2020-11-23 ENCOUNTER — Ambulatory Visit: Payer: BC Managed Care – PPO

## 2020-11-23 ENCOUNTER — Ambulatory Visit: Payer: BC Managed Care – PPO | Attending: Critical Care Medicine

## 2020-11-23 DIAGNOSIS — Z23 Encounter for immunization: Secondary | ICD-10-CM

## 2020-11-23 NOTE — Progress Notes (Signed)
Moderna booster administered to R deltoid, pt tolerated well and waited required 15 mins w/o adverse reaction noted, Vis given

## 2020-11-30 ENCOUNTER — Telehealth: Payer: Self-pay | Admitting: Internal Medicine

## 2020-11-30 NOTE — Telephone Encounter (Signed)
Patient is requesting a call back about recent lab results

## 2020-11-30 NOTE — Telephone Encounter (Signed)
Pt informed of below.     Please call and let her know that the sugars are in the pre-diabetes range. She should work on reducing carbohydrates in her diet to help avoid getting diabetes in the future. We can have her talk to a nutritionist if she wants. Other labs normal. Should come back in 6 months to recheck the sugars.

## 2021-04-05 ENCOUNTER — Other Ambulatory Visit: Payer: Self-pay | Admitting: Obstetrics & Gynecology

## 2021-04-05 DIAGNOSIS — Z1231 Encounter for screening mammogram for malignant neoplasm of breast: Secondary | ICD-10-CM

## 2021-04-08 ENCOUNTER — Ambulatory Visit
Admission: RE | Admit: 2021-04-08 | Discharge: 2021-04-08 | Disposition: A | Discharge status: Home / Self Care | Payer: BC Managed Care – PPO | Source: Ambulatory Visit | Attending: Obstetrics & Gynecology | Admitting: Obstetrics & Gynecology

## 2021-04-08 ENCOUNTER — Other Ambulatory Visit: Payer: Self-pay

## 2021-04-08 DIAGNOSIS — Z1231 Encounter for screening mammogram for malignant neoplasm of breast: Secondary | ICD-10-CM

## 2021-05-04 ENCOUNTER — Ambulatory Visit (INDEPENDENT_AMBULATORY_CARE_PROVIDER_SITE_OTHER): Payer: Medicaid Other | Admitting: Emergency Medicine

## 2021-05-04 ENCOUNTER — Other Ambulatory Visit: Payer: Self-pay

## 2021-05-04 ENCOUNTER — Encounter: Payer: Self-pay | Admitting: Emergency Medicine

## 2021-05-04 VITALS — BP 120/76 | HR 79 | Temp 99.4°F | Ht 66.25 in | Wt 215.0 lb

## 2021-05-04 DIAGNOSIS — I10 Essential (primary) hypertension: Secondary | ICD-10-CM | POA: Diagnosis not present

## 2021-05-04 DIAGNOSIS — Z1211 Encounter for screening for malignant neoplasm of colon: Secondary | ICD-10-CM | POA: Diagnosis not present

## 2021-05-04 MED ORDER — AMLODIPINE BESYLATE 5 MG PO TABS: 5.0000 mg | ORAL_TABLET | Freq: Every day | ORAL | 3 refills | Status: DC

## 2021-05-04 NOTE — Progress Notes (Signed)
Tami Martin 50 y.o.   Chief Complaint  Patient presents with  . Hypertension    Dr Frutoso Chase patient    HISTORY OF PRESENT ILLNESS: This is a 50 y.o. female patient of Dr. Okey Dupre with history of hypertension on amlodipine 5 mg daily concerned about elevated blood pressure reading she had [facility] recently.  Patient will be getting physical therapy on her right knee and needs to know if she is fit for PT. Also needs medication refill. No other complaints or medical concerns today.  HPI   Prior to Admission medications   Medication Sig Start Date End Date Taking? Authorizing Provider  amLODipine (NORVASC) 5 MG tablet Take 1 tablet (5 mg total) by mouth daily. 11/17/20  Yes Myrlene Broker, MD  Multiple Vitamin (MULTIVITAMIN WITH MINERALS) TABS tablet Take 1 tablet by mouth daily.   Yes [provider]  pantoprazole (PROTONIX) 40 MG tablet Take 1 tablet (40 mg total) by mouth daily. 11/17/20  Yes Myrlene Broker, MD  Calcium Carbonate-Vit D-Min 1200-1000 MG-UNIT CHEW Chew 1 capsule by mouth daily. Patient not taking: Reported on 05/04/2021    [provider]  cetirizine (ZYRTEC) 10 MG tablet Take 1 tablet (10 mg total) by mouth daily. Patient not taking: Reported on 05/04/2021 08/29/16   Julieanne Manson, MD  meloxicam (MOBIC) 15 MG tablet Take 1 tablet (15 mg total) by mouth daily. Patient not taking: Reported on 05/04/2021 11/17/20   Myrlene Broker, MD    No Known Allergies  Patient Active Problem List   Diagnosis Date Noted  . Pes planus 10/11/2017  . Prediabetes 08/29/2016  . GERD (gastroesophageal reflux disease) 08/29/2016  . Headache 01/22/2015  . Routine general medical examination at a health care facility 09/11/2014  . HTN (hypertension) 09/11/2013  . Allergic rhinitis 06/10/2013  . 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 Allergy    Seasonal allergies   .  DDD (degenerative disc disease), lumbosacral 2011  . GERD (gastroesophageal reflux disease)    denies today 07/2016  . Prediabetes 2014    Past Surgical History:  Procedure Laterality Date  . KNEE SURGERY Right 2020    Social History   Socioeconomic History  . Marital status: Legally Separated    Spouse name: Not on file  . Number of children: 7  . Years of education: 8  . Highest education level: Not on file  Occupational History  . Occupation: HOUSEKEEPING    Employer: [employer]   Tobacco Use  . Smoking status: Never Smoker  . Smokeless tobacco: Never Used  Substance and Sexual Activity  . Alcohol use: Yes    Comment: once month-wine  . Drug use: No  . Sexual activity: Yes    Birth control/protection: Injection    Comment: Depo Provera every 3 months at [facility]  Other Topics Concern  . Not on file  Social History Narrative   Originally from [place]   Came to [place] [employer] [place]. In 2010   Lives at home with her 5 younger children.     Her 2 older children live by themselves here in [place]   No support from her estranged husband   Social Determinants of Corporate investment banker Strain: Not on file  Food Insecurity: Not on file  Transportation Needs: Not on file  Physical Activity: Not on file  Stress: Not on file  Social Connections: Not on file  Intimate  Partner Violence: Not on file    Family History  Problem Relation Age of Onset  . Breast cancer Neg Hx      Review of Systems  Constitutional: Negative.  Negative for chills and fever.  HENT: Negative.  Negative for congestion and sore throat.   Respiratory: Negative.  Negative for cough and shortness of breath.   Cardiovascular: Negative.  Negative for chest pain and palpitations.  Gastrointestinal: Negative for abdominal pain, nausea and vomiting.  Skin: Negative.   Neurological: Negative for dizziness and headaches.  All other systems reviewed and are negative.   Today's Vitals    05/04/21 1119  BP: 120/76  Pulse: 79  Temp: 99.4 F (37.4 C)  TempSrc: Oral  SpO2: 98%  Weight: 215 lb (97.5 kg)  Height: 5' 6.25" (1.683 m)   Body mass index is 34.44 kg/m. BP Readings from Last 3 Encounters:  05/04/21 120/76  11/17/20 110/78  09/06/20 140/74    Physical Exam Vitals reviewed.  Constitutional:      Appearance: Normal appearance.  HENT:     Head: Normocephalic.  Eyes:     Extraocular Movements: Extraocular movements intact.     Pupils: Pupils are equal, round, and reactive to light.  Cardiovascular:     Rate and Rhythm: Normal rate and regular rhythm.     Pulses: Normal pulses.     Heart sounds: Normal heart sounds.  Pulmonary:     Effort: Pulmonary effort is normal.     Breath sounds: Normal breath sounds.  Musculoskeletal:     Cervical back: Normal range of motion and neck supple.  Skin:    General: Skin is warm and dry.     Capillary Refill: Capillary refill takes less than 2 seconds.  Neurological:     General: No focal deficit present.     Mental Status: She is alert and oriented to person, place, and time.  Psychiatric:        Mood and Affect: Mood normal.        Behavior: Behavior normal.      ASSESSMENT & PLAN: HTN (hypertension) Well-controlled hypertension.  Continue amlodipine 5 mg daily. Patient fit for physical therapy of right knee.  Pier was seen today for hypertension.  Diagnoses and all orders for this visit:  Essential hypertension -     amLODipine (NORVASC) 5 MG tablet; Take 1 tablet (5 mg total) by mouth daily.  Screening for colon cancer -     Ambulatory referral to Gastroenterology    Patient Instructions   Continue amlodipine 5 mg daily. Hypertension, Adult High blood pressure (hypertension) is when the force of blood pumping through the arteries is too strong. The arteries are the blood vessels that carry blood from the heart throughout the body. Hypertension forces the heart to work harder to pump  blood and may cause arteries to become narrow or stiff. Untreated or uncontrolled hypertension can cause a heart attack, heart failure, a stroke, kidney disease, and other problems. A blood pressure reading consists of a higher number over a lower number. Ideally, your blood pressure should be below 120/80. The first ("top") number is called the systolic pressure. It is a measure of the pressure in your arteries as your heart beats. The second ("bottom") number is called the diastolic pressure. It is a measure of the pressure in your arteries as the heart relaxes. What are the causes? The exact cause of this condition is not known. There are some conditions that result in or  are related to high blood pressure. What increases the risk? Some risk factors for high blood pressure are under your control. The following factors may make you more likely to develop this condition:  Smoking.  Having type 2 diabetes mellitus, high cholesterol, or both.  Not getting enough exercise or physical activity.  Being overweight.  Having too much fat, sugar, calories, or salt (sodium) in your diet.  Drinking too much alcohol. Some risk factors for high blood pressure may be difficult or impossible to change. Some of these factors include:  Having chronic kidney disease.  Having a family history of high blood pressure.  Age. Risk increases with age.  Race. You may be at higher risk if you are African American.  Gender. Men are at higher risk than women before age 83. After age 71, women are at higher risk than men.  Having obstructive sleep apnea.  Stress. What are the signs or symptoms? High blood pressure may not cause symptoms. Very high blood pressure (hypertensive crisis) may cause:  Headache.  Anxiety.  Shortness of breath.  Nosebleed.  Nausea and vomiting.  Vision changes.  Severe chest pain.  Seizures. How is this diagnosed? This condition is diagnosed by measuring your blood  pressure while you are seated, with your arm resting on a flat surface, your legs uncrossed, and your feet flat on the floor. The cuff of the blood pressure monitor will be placed directly against the skin of your upper arm at the level of your heart. It should be measured at least twice using the same arm. Certain conditions can cause a difference in blood pressure between your right and left arms. Certain factors can cause blood pressure readings to be lower or higher than normal for a short period of time:  When your blood pressure is higher when you are in a health care provider's office than when you are at home, this is called white coat hypertension. Most people with this condition do not need medicines.  When your blood pressure is higher at home than when you are in a health care provider's office, this is called masked hypertension. Most people with this condition may need medicines to control blood pressure. If you have a high blood pressure reading during one visit or you have normal blood pressure with other risk factors, you may be asked to:  Return on a different day to have your blood pressure checked again.  Monitor your blood pressure at home for 1 week or longer. If you are diagnosed with hypertension, you may have other blood or imaging tests to help your health care provider understand your overall risk for other conditions. How is this treated? This condition is treated by making healthy lifestyle changes, such as eating healthy foods, exercising more, and reducing your alcohol intake. Your health care provider may prescribe medicine if lifestyle changes are not enough to get your blood pressure under control, and if:  Your systolic blood pressure is above 130.  Your diastolic blood pressure is above 80. Your personal target blood pressure may vary depending on your medical conditions, your age, and other factors. Follow these instructions at home: Eating and drinking  Eat  a diet that is high in fiber and potassium, and low in sodium, added sugar, and fat. An example eating plan is called the DASH (Dietary Approaches to Stop Hypertension) diet. To eat this way: ? Eat plenty of fresh fruits and vegetables. Try to fill one half of your plate at each  meal with fruits and vegetables. ? Eat whole grains, such as whole-wheat pasta, brown rice, or whole-grain bread. Fill about one fourth of your plate with whole grains. ? Eat or drink low-fat dairy products, such as skim milk or low-fat yogurt. ? Avoid fatty cuts of meat, processed or cured meats, and poultry with skin. Fill about one fourth of your plate with lean proteins, such as fish, chicken without skin, beans, eggs, or tofu. ? Avoid pre-made and processed foods. These tend to be higher in sodium, added sugar, and fat.  Reduce your daily sodium intake. Most people with hypertension should eat less than 1,500 mg of sodium a day.  Do not drink alcohol if: ? Your health care provider tells you not to drink. ? You are pregnant, may be pregnant, or are planning to become pregnant.  If you drink alcohol: ? Limit how much you use to:  0-1 drink a day for women.  0-2 drinks a day for men. ? Be aware of how much alcohol is in your drink. In the [place] one drink equals one 12 oz bottle of beer (355 mL), one 5 oz glass of wine (148 mL), or one 1 oz glass of hard liquor (44 mL).   Lifestyle  Work with your health care provider to maintain a healthy body weight or to lose weight. Ask what an ideal weight is for you.  Get at least 30 minutes of exercise most days of the week. Activities may include walking, swimming, or biking.  Include exercise to strengthen your muscles (resistance exercise), such as Pilates or lifting weights, as part of your weekly exercise routine. Try to do these types of exercises for 30 minutes at least 3 days a week.  Do not use any products that contain nicotine or tobacco, such as cigarettes,  e-cigarettes, and chewing tobacco. If you need help quitting, ask your health care provider.  Monitor your blood pressure at home as told by your health care provider.  Keep all follow-up visits as told by your health care provider. This is important.   Medicines  Take over-the-counter and prescription medicines only as told by your health care provider. Follow directions carefully. Blood pressure medicines must be taken as prescribed.  Do not skip doses of blood pressure medicine. Doing this puts you at risk for problems and can make the medicine less effective.  Ask your health care provider about side effects or reactions to medicines that you should watch for. Contact a health care provider if you:  Think you are having a reaction to a medicine you are taking.  Have headaches that keep coming back (recurring).  Feel dizzy.  Have swelling in your ankles.  Have trouble with your vision. Get help right away if you:  Develop a severe headache or confusion.  Have unusual weakness or numbness.  Feel faint.  Have severe pain in your chest or abdomen.  Vomit repeatedly.  Have trouble breathing. Summary  Hypertension is when the force of blood pumping through your arteries is too strong. If this condition is not controlled, it may put you at risk for serious complications.  Your personal target blood pressure may vary depending on your medical conditions, your age, and other factors. For most people, a normal blood pressure is less than 120/80.  Hypertension is treated with lifestyle changes, medicines, or a combination of both. Lifestyle changes include losing weight, eating a healthy, low-sodium diet, exercising more, and limiting alcohol. This information is not intended to  replace advice given to you by your health care provider. Make sure you discuss any questions you have with your health care provider. Document Revised: 07/31/2018 Document Reviewed: 07/31/2018 Elsevier  Patient Education  2021 Elsevier Inc.      Edwina Barth, MD [facility]

## 2021-05-04 NOTE — Patient Instructions (Signed)
Continue amlodipine 5 mg daily. Hypertension, Adult High blood pressure (hypertension) is when the force of blood pumping through the arteries is too strong. The arteries are the blood vessels that carry blood from the heart throughout the body. Hypertension forces the heart to work harder to pump blood and may cause arteries to become narrow or stiff. Untreated or uncontrolled hypertension can cause a heart attack, heart failure, a stroke, kidney disease, and other problems. A blood pressure reading consists of a higher number over a lower number. Ideally, your blood pressure should be below 120/80. The first ("top") number is called the systolic pressure. It is a measure of the pressure in your arteries as your heart beats. The second ("bottom") number is called the diastolic pressure. It is a measure of the pressure in your arteries as the heart relaxes. What are the causes? The exact cause of this condition is not known. There are some conditions that result in or are related to high blood pressure. What increases the risk? Some risk factors for high blood pressure are under your control. The following factors may make you more likely to develop this condition:  Smoking.  Having type 2 diabetes mellitus, high cholesterol, or both.  Not getting enough exercise or physical activity.  Being overweight.  Having too much fat, sugar, calories, or salt (sodium) in your diet.  Drinking too much alcohol. Some risk factors for high blood pressure may be difficult or impossible to change. Some of these factors include:  Having chronic kidney disease.  Having a family history of high blood pressure.  Age. Risk increases with age.  Race. You may be at higher risk if you are African American.  Gender. Men are at higher risk than women before age 7. After age 44, women are at higher risk than men.  Having obstructive sleep apnea.  Stress. What are the signs or symptoms? High blood pressure  may not cause symptoms. Very high blood pressure (hypertensive crisis) may cause:  Headache.  Anxiety.  Shortness of breath.  Nosebleed.  Nausea and vomiting.  Vision changes.  Severe chest pain.  Seizures. How is this diagnosed? This condition is diagnosed by measuring your blood pressure while you are seated, with your arm resting on a flat surface, your legs uncrossed, and your feet flat on the floor. The cuff of the blood pressure monitor will be placed directly against the skin of your upper arm at the level of your heart. It should be measured at least twice using the same arm. Certain conditions can cause a difference in blood pressure between your right and left arms. Certain factors can cause blood pressure readings to be lower or higher than normal for a short period of time:  When your blood pressure is higher when you are in a health care provider's office than when you are at home, this is called white coat hypertension. Most people with this condition do not need medicines.  When your blood pressure is higher at home than when you are in a health care provider's office, this is called masked hypertension. Most people with this condition may need medicines to control blood pressure. If you have a high blood pressure reading during one visit or you have normal blood pressure with other risk factors, you may be asked to:  Return on a different day to have your blood pressure checked again.  Monitor your blood pressure at home for 1 week or longer. If you are diagnosed with hypertension, you  may have other blood or imaging tests to help your health care provider understand your overall risk for other conditions. How is this treated? This condition is treated by making healthy lifestyle changes, such as eating healthy foods, exercising more, and reducing your alcohol intake. Your health care provider may prescribe medicine if lifestyle changes are not enough to get your blood  pressure under control, and if:  Your systolic blood pressure is above 130.  Your diastolic blood pressure is above 80. Your personal target blood pressure may vary depending on your medical conditions, your age, and other factors. Follow these instructions at home: Eating and drinking  Eat a diet that is high in fiber and potassium, and low in sodium, added sugar, and fat. An example eating plan is called the DASH (Dietary Approaches to Stop Hypertension) diet. To eat this way: ? Eat plenty of fresh fruits and vegetables. Try to fill one half of your plate at each meal with fruits and vegetables. ? Eat whole grains, such as whole-wheat pasta, brown rice, or whole-grain bread. Fill about one fourth of your plate with whole grains. ? Eat or drink low-fat dairy products, such as skim milk or low-fat yogurt. ? Avoid fatty cuts of meat, processed or cured meats, and poultry with skin. Fill about one fourth of your plate with lean proteins, such as fish, chicken without skin, beans, eggs, or tofu. ? Avoid pre-made and processed foods. These tend to be higher in sodium, added sugar, and fat.  Reduce your daily sodium intake. Most people with hypertension should eat less than 1,500 mg of sodium a day.  Do not drink alcohol if: ? Your health care provider tells you not to drink. ? You are pregnant, may be pregnant, or are planning to become pregnant.  If you drink alcohol: ? Limit how much you use to:  0-1 drink a day for women.  0-2 drinks a day for men. ? Be aware of how much alcohol is in your drink. In the [place] one drink equals one 12 oz bottle of beer (355 mL), one 5 oz glass of wine (148 mL), or one 1 oz glass of hard liquor (44 mL).   Lifestyle  Work with your health care provider to maintain a healthy body weight or to lose weight. Ask what an ideal weight is for you.  Get at least 30 minutes of exercise most days of the week. Activities may include walking, swimming, or  biking.  Include exercise to strengthen your muscles (resistance exercise), such as Pilates or lifting weights, as part of your weekly exercise routine. Try to do these types of exercises for 30 minutes at least 3 days a week.  Do not use any products that contain nicotine or tobacco, such as cigarettes, e-cigarettes, and chewing tobacco. If you need help quitting, ask your health care provider.  Monitor your blood pressure at home as told by your health care provider.  Keep all follow-up visits as told by your health care provider. This is important.   Medicines  Take over-the-counter and prescription medicines only as told by your health care provider. Follow directions carefully. Blood pressure medicines must be taken as prescribed.  Do not skip doses of blood pressure medicine. Doing this puts you at risk for problems and can make the medicine less effective.  Ask your health care provider about side effects or reactions to medicines that you should watch for. Contact a health care provider if you:  Think you are  having a reaction to a medicine you are taking.  Have headaches that keep coming back (recurring).  Feel dizzy.  Have swelling in your ankles.  Have trouble with your vision. Get help right away if you:  Develop a severe headache or confusion.  Have unusual weakness or numbness.  Feel faint.  Have severe pain in your chest or abdomen.  Vomit repeatedly.  Have trouble breathing. Summary  Hypertension is when the force of blood pumping through your arteries is too strong. If this condition is not controlled, it may put you at risk for serious complications.  Your personal target blood pressure may vary depending on your medical conditions, your age, and other factors. For most people, a normal blood pressure is less than 120/80.  Hypertension is treated with lifestyle changes, medicines, or a combination of both. Lifestyle changes include losing weight, eating  a healthy, low-sodium diet, exercising more, and limiting alcohol. This information is not intended to replace advice given to you by your health care provider. Make sure you discuss any questions you have with your health care provider. Document Revised: 07/31/2018 Document Reviewed: 07/31/2018 Elsevier Patient Education  2021 [employer].

## 2021-05-04 NOTE — Assessment & Plan Note (Signed)
Well-controlled hypertension.  Continue amlodipine 5 mg daily. Patient fit for physical therapy of right knee.

## 2021-05-23 ENCOUNTER — Other Ambulatory Visit: Payer: Self-pay | Admitting: Internal Medicine

## 2021-05-23 ENCOUNTER — Other Ambulatory Visit: Payer: Self-pay

## 2021-05-23 ENCOUNTER — Ambulatory Visit: Payer: Medicaid Other | Admitting: Internal Medicine

## 2021-05-23 ENCOUNTER — Other Ambulatory Visit (INDEPENDENT_AMBULATORY_CARE_PROVIDER_SITE_OTHER): Payer: Medicaid Other

## 2021-05-23 DIAGNOSIS — R7303 Prediabetes: Secondary | ICD-10-CM

## 2021-05-23 LAB — HEMOGLOBIN A1C: Hgb A1c MFr Bld: 6.5 % (ref 4.6–6.5)

## 2021-05-31 ENCOUNTER — Ambulatory Visit: Payer: BC Managed Care – PPO | Admitting: Internal Medicine

## 2021-07-14 ENCOUNTER — Ambulatory Visit (INDEPENDENT_AMBULATORY_CARE_PROVIDER_SITE_OTHER): Payer: Medicaid Other | Admitting: Internal Medicine

## 2021-07-14 ENCOUNTER — Encounter: Payer: Self-pay | Admitting: Internal Medicine

## 2021-07-14 ENCOUNTER — Other Ambulatory Visit: Payer: Self-pay

## 2021-07-14 DIAGNOSIS — K219 Gastro-esophageal reflux disease without esophagitis: Secondary | ICD-10-CM | POA: Diagnosis not present

## 2021-07-14 DIAGNOSIS — I10 Essential (primary) hypertension: Secondary | ICD-10-CM

## 2021-07-14 MED ORDER — AMLODIPINE BESYLATE 5 MG PO TABS: 5.0000 mg | ORAL_TABLET | Freq: Every day | ORAL | 3 refills | Status: DC

## 2021-07-14 MED ORDER — PANTOPRAZOLE SODIUM 40 MG PO TBEC: 40.0000 mg | DELAYED_RELEASE_TABLET | Freq: Every day | ORAL | 3 refills | Status: DC

## 2021-07-14 NOTE — Patient Instructions (Addendum)
For the eyes try zyrtec (cetirizine) or claritin (loratadine)  We have given you a copy of the letter today that you can do the knee assessment.   We refilled the amlodipine so you can keep taking that.  We have sent in the stomach medicine to take protonix 1 pill daily to help the stomach pain. If this does not help let us know by calling.

## 2021-07-14 NOTE — Progress Notes (Signed)
   Subjective:   Patient ID: Tami Martin, female    DOB: [birth year], 50 y.o.   MRN: [id number]  HPI The patient is a 50 YO female coming in for several concerns.   [id number] Tami Martin   Review of Systems  Constitutional: Negative.   HENT: Negative.    Eyes: Negative.   Respiratory:  Negative for cough, chest tightness and shortness of breath.   Cardiovascular:  Negative for chest pain, palpitations and leg swelling.  Gastrointestinal:  Positive for abdominal pain. Negative for abdominal distention, constipation, diarrhea, nausea and vomiting.  Musculoskeletal:  Positive for arthralgias, gait problem and myalgias.  Skin: Negative.   Psychiatric/Behavioral: Negative.     Objective:  Physical Exam Constitutional:      Appearance: She is well-developed.  HENT:     Head: Normocephalic and atraumatic.  Cardiovascular:     Rate and Rhythm: Normal rate and regular rhythm.  Pulmonary:     Effort: Pulmonary effort is normal. No respiratory distress.     Breath sounds: Normal breath sounds. No wheezing or rales.  Abdominal:     General: Bowel sounds are normal. There is no distension.     Palpations: Abdomen is soft.     Tenderness: There is no abdominal tenderness. There is no rebound.     Comments: Epigastric pain to palpation  Musculoskeletal:        General: Tenderness present.     Cervical back: Normal range of motion.  Skin:    General: Skin is warm and dry.  Neurological:     Mental Status: She is alert and oriented to person, place, and time.     Coordination: Coordination normal.    Vitals:   07/14/21 1351  BP: 126/78  Pulse: 69  Resp: 18  Temp: 98.6 F (37 C)  TempSrc: Oral  SpO2: 98%  Weight: 212 lb 9.6 oz (96.4 kg)  Height: 5' 6.25" (1.683 m)    This visit occurred during the SARS-CoV-2 public health emergency.  Safety protocols were in place, including screening questions prior to the visit, additional usage of staff PPE, and extensive cleaning of  exam room while observing appropriate contact time as indicated for disinfecting solutions.   Assessment & Plan:

## 2021-07-15 NOTE — Assessment & Plan Note (Signed)
With stomach pain epigastric. She is not taking protonix due to running out. Refilled today.

## 2021-07-15 NOTE — Assessment & Plan Note (Addendum)
Has been off medication for unknown reason. Having some mild headaches so will resume amlodipine 5 mg daily and refilled. Letter done so she can get functional assessment of the right knee with ortho and faxed during our visit.

## 2021-09-13 ENCOUNTER — Ambulatory Visit (INDEPENDENT_AMBULATORY_CARE_PROVIDER_SITE_OTHER): Payer: Medicaid Other | Admitting: Internal Medicine

## 2021-09-13 ENCOUNTER — Encounter: Payer: Self-pay | Admitting: Internal Medicine

## 2021-09-13 ENCOUNTER — Ambulatory Visit (INDEPENDENT_AMBULATORY_CARE_PROVIDER_SITE_OTHER): Payer: Medicaid Other

## 2021-09-13 ENCOUNTER — Other Ambulatory Visit: Payer: Self-pay

## 2021-09-13 VITALS — BP 130/72 | HR 76 | Temp 98.3°F | Resp 18 | Ht 66.25 in | Wt 204.0 lb

## 2021-09-13 DIAGNOSIS — K219 Gastro-esophageal reflux disease without esophagitis: Secondary | ICD-10-CM | POA: Diagnosis not present

## 2021-09-13 DIAGNOSIS — R232 Flushing: Secondary | ICD-10-CM | POA: Diagnosis not present

## 2021-09-13 DIAGNOSIS — M545 Low back pain, unspecified: Secondary | ICD-10-CM | POA: Diagnosis not present

## 2021-09-13 DIAGNOSIS — R7303 Prediabetes: Secondary | ICD-10-CM

## 2021-09-13 LAB — HEMOGLOBIN A1C: Hgb A1c MFr Bld: 6.3 % (ref 4.6–6.5)

## 2021-09-13 LAB — TSH: TSH: 1.81 u[IU]/mL (ref 0.35–5.50)

## 2021-09-13 LAB — FOLLICLE STIMULATING HORMONE: FSH: 16.3 m[IU]/mL

## 2021-09-13 NOTE — Patient Instructions (Signed)
We will do the x-ray today of the back and get you in with the GI doctor about the stomach.

## 2021-09-13 NOTE — Progress Notes (Signed)
   Subjective:   Patient ID: Tami Martin, female    DOB: 1971/11/03, 50 y.o.   MRN: [id number]  HPI The patient is a 50 YO female coming in for low back pain and cold/heat intolerance  Review of Systems  Constitutional:  Positive for activity change. Negative for appetite change, chills, fatigue, fever and unexpected weight change.  Respiratory: Negative.    Cardiovascular: Negative.   Gastrointestinal: Negative.   Endocrine: Positive for cold intolerance and heat intolerance.  Musculoskeletal:  Positive for arthralgias, back pain and myalgias. Negative for gait problem and joint swelling.  Skin: Negative.   Neurological: Negative.    Objective:  Physical Exam Constitutional:      Appearance: She is well-developed.  HENT:     Head: Normocephalic and atraumatic.  Cardiovascular:     Rate and Rhythm: Normal rate and regular rhythm.  Pulmonary:     Effort: Pulmonary effort is normal. No respiratory distress.     Breath sounds: Normal breath sounds. No wheezing or rales.  Abdominal:     General: Bowel sounds are normal. There is no distension.     Palpations: Abdomen is soft.     Tenderness: There is no abdominal tenderness. There is no rebound.  Musculoskeletal:        General: Tenderness present.     Cervical back: Normal range of motion.  Skin:    General: Skin is warm and dry.  Neurological:     Mental Status: She is alert and oriented to person, place, and time.     Coordination: Coordination normal.    Vitals:   09/13/21 0929  BP: 130/72  Pulse: 76  Resp: 18  Temp: 98.3 F (36.8 C)  TempSrc: Oral  SpO2: 97%  Weight: 204 lb (92.5 kg)  Height: 5' 6.25" (1.683 m)    This visit occurred during the SARS-CoV-2 public health emergency.  Safety protocols were in place, including screening questions prior to the visit, additional usage of staff PPE, and extensive cleaning of exam room while observing appropriate contact time as indicated for disinfecting  solutions.   Assessment & Plan:

## 2021-09-16 DIAGNOSIS — R232 Flushing: Secondary | ICD-10-CM | POA: Insufficient documentation

## 2021-09-16 NOTE — Assessment & Plan Note (Signed)
Checking HgA1c today and adjust as needed.  

## 2021-09-16 NOTE — Assessment & Plan Note (Signed)
Not controlled with protonix and referral to GI done for further evaluation. EGD may be needed.

## 2021-09-16 NOTE — Assessment & Plan Note (Signed)
Checking FSH and TSH and HgA1c to assess for causes.

## 2021-09-16 NOTE — Assessment & Plan Note (Signed)
Ordered x-ray lumbar due to >2 weeks pain without relief.

## 2022-01-05 ENCOUNTER — Encounter: Payer: Self-pay | Admitting: Internal Medicine

## 2022-03-08 ENCOUNTER — Ambulatory Visit (INDEPENDENT_AMBULATORY_CARE_PROVIDER_SITE_OTHER): Payer: Medicaid Other | Admitting: Internal Medicine

## 2022-03-08 ENCOUNTER — Encounter: Payer: Self-pay | Admitting: Internal Medicine

## 2022-03-08 ENCOUNTER — Ambulatory Visit (INDEPENDENT_AMBULATORY_CARE_PROVIDER_SITE_OTHER): Payer: Medicaid Other

## 2022-03-08 VITALS — BP 140/80 | HR 78 | Resp 18 | Ht 66.25 in | Wt 212.0 lb

## 2022-03-08 DIAGNOSIS — I10 Essential (primary) hypertension: Secondary | ICD-10-CM | POA: Diagnosis not present

## 2022-03-08 DIAGNOSIS — R7303 Prediabetes: Secondary | ICD-10-CM

## 2022-03-08 DIAGNOSIS — R0789 Other chest pain: Secondary | ICD-10-CM | POA: Diagnosis not present

## 2022-03-08 DIAGNOSIS — K219 Gastro-esophageal reflux disease without esophagitis: Secondary | ICD-10-CM | POA: Diagnosis not present

## 2022-03-08 DIAGNOSIS — M5441 Lumbago with sciatica, right side: Secondary | ICD-10-CM | POA: Diagnosis not present

## 2022-03-08 DIAGNOSIS — R519 Headache, unspecified: Secondary | ICD-10-CM

## 2022-03-08 DIAGNOSIS — Z0001 Encounter for general adult medical examination with abnormal findings: Secondary | ICD-10-CM

## 2022-03-08 DIAGNOSIS — G8929 Other chronic pain: Secondary | ICD-10-CM

## 2022-03-08 LAB — LIPID PANEL
Cholesterol: 118 mg/dL (ref 0–200)
HDL: 45.5 mg/dL (ref >39.00)
LDL Cholesterol: 59 mg/dL (ref 0–99)
NonHDL: 72.42
Total CHOL/HDL Ratio: 3
Triglycerides: 67 mg/dL (ref 0.0–149.0)
VLDL: 13.4 mg/dL (ref 0.0–40.0)

## 2022-03-08 LAB — COMPREHENSIVE METABOLIC PANEL
ALT: 23 U/L (ref 0–35)
AST: 30 U/L (ref 0–37)
Albumin: 3.9 g/dL (ref 3.5–5.2)
Alkaline Phosphatase: 86 U/L (ref 39–117)
BUN: 11 mg/dL (ref 6–23)
CO2: 24 mEq/L (ref 19–32)
Calcium: 9.3 mg/dL (ref 8.4–10.5)
Chloride: 105 mEq/L (ref 96–112)
Creatinine, Ser: 0.68 mg/dL (ref 0.40–1.20)
GFR: 100.95 mL/min (ref >60.00)
Glucose, Bld: 136 mg/dL — ABNORMAL HIGH (ref 70–99)
Potassium: 4.2 mEq/L (ref 3.5–5.1)
Sodium: 137 mEq/L (ref 135–145)
Total Bilirubin: 0.7 mg/dL (ref 0.2–1.2)
Total Protein: 7.2 g/dL (ref 6.0–8.3)

## 2022-03-08 LAB — VITAMIN B12: Vitamin B-12: 753 pg/mL (ref 211–911)

## 2022-03-08 LAB — CBC
HCT: 42.7 % (ref 36.0–46.0)
Hemoglobin: 14.1 g/dL (ref 12.0–15.0)
MCHC: 33.1 g/dL (ref 30.0–36.0)
MCV: 86.7 fl (ref 78.0–100.0)
Platelets: 272 10*3/uL (ref 150.0–400.0)
RBC: 4.93 Mil/uL (ref 3.87–5.11)
RDW: 12.5 % (ref 11.5–15.5)
WBC: 4.3 10*3/uL (ref 4.0–10.5)

## 2022-03-08 LAB — BRAIN NATRIURETIC PEPTIDE: Pro B Natriuretic peptide (BNP): 34 pg/mL (ref 0.0–100.0)

## 2022-03-08 LAB — SEDIMENTATION RATE: Sed Rate: 18 mm/hr (ref 0–30)

## 2022-03-08 LAB — CK: Total CK: 168 U/L (ref 7–177)

## 2022-03-08 LAB — TROPONIN I (HIGH SENSITIVITY): High Sens Troponin I: 6 ng/L (ref 2–17)

## 2022-03-08 LAB — HEMOGLOBIN A1C: Hgb A1c MFr Bld: 6.5 % (ref 4.6–6.5)

## 2022-03-08 LAB — VITAMIN D 25 HYDROXY (VIT D DEFICIENCY, FRACTURES): VITD: 26.97 ng/mL — ABNORMAL LOW (ref 30.00–100.00)

## 2022-03-08 LAB — TSH: TSH: 2.03 u[IU]/mL (ref 0.35–5.50)

## 2022-03-08 LAB — LIPASE: Lipase: 14 U/L (ref 11.0–59.0)

## 2022-03-08 MED ORDER — MELOXICAM 15 MG PO TABS: 15.0000 mg | ORAL_TABLET | Freq: Every day | ORAL | 0 refills | Status: DC

## 2022-03-08 NOTE — Patient Instructions (Addendum)
For the GI doctor you can call to schedule [phone].  ? ?We will have the back doctor call you to schedule. ? ?We are going to check a chest x-ray and labs today.  ? ?We have sent in a medicine to try called meloxicam to take 1 pill daily to see if this helps the headaches and the chest pain. ?

## 2022-03-08 NOTE — Progress Notes (Signed)
? ?  Subjective:  ? ?Patient ID: Tami Martin, female    DOB: Apr 14, 1971, 51 y.o.   MRN: [id number] ? ?HPI ?The patient is a 51 YO female coming in for physical but also acute concerns including new chest pains, worsening GERD, low back pain. ? ?PMH, [facility], social history reviewed and updated ? ?Review of Systems  ?Constitutional: Negative.   ?HENT: Negative.    ?Eyes: Negative.   ?Respiratory:  Negative for cough, chest tightness and shortness of breath.   ?Cardiovascular:  Positive for chest pain. Negative for palpitations and leg swelling.  ?Gastrointestinal:  Positive for abdominal pain. Negative for abdominal distention, constipation, diarrhea, nausea and vomiting.  ?Musculoskeletal:  Positive for back pain.  ?Skin: Negative.   ?Neurological: Negative.   ?Psychiatric/Behavioral: Negative.    ? ?Objective:  ?Physical Exam ?Constitutional:   ?   Appearance: She is well-developed.  ?HENT:  ?   Head: Normocephalic and atraumatic.  ?Cardiovascular:  ?   Rate and Rhythm: Normal rate and regular rhythm.  ?Pulmonary:  ?   Effort: Pulmonary effort is normal. No respiratory distress.  ?   Breath sounds: Normal breath sounds. No wheezing or rales.  ?Chest:  ?   Chest wall: Tenderness present.  ?Abdominal:  ?   General: Bowel sounds are normal. There is no distension.  ?   Palpations: Abdomen is soft.  ?   Tenderness: There is abdominal tenderness. There is no rebound.  ?Musculoskeletal:  ?   Cervical back: Normal range of motion.  ?Skin: ?   General: Skin is warm and dry.  ?Neurological:  ?   Mental Status: She is alert and oriented to person, place, and time.  ?   Coordination: Coordination normal.  ? ? ?Vitals:  ? 03/08/22 0845  ?BP: 140/80  ?Pulse: 78  ?Resp: 18  ?SpO2: 97%  ?Weight: 212 lb (96.2 kg)  ?Height: 5' 6.25" (1.683 m)  ? ?EKG: Rate 69, axis normal, interval normal, sinus, no st or t wave changes, no significant change compared to prior 2019 ? ?This visit occurred during the SARS-CoV-2 public health  emergency.  Safety protocols were in place, including screening questions prior to the visit, additional usage of staff PPE, and extensive cleaning of exam room while observing appropriate contact time as indicated for disinfecting solutions.  ? ?Assessment & Plan:  ? ?

## 2022-03-09 ENCOUNTER — Other Ambulatory Visit: Payer: Self-pay | Admitting: Internal Medicine

## 2022-03-09 DIAGNOSIS — G8929 Other chronic pain: Secondary | ICD-10-CM | POA: Insufficient documentation

## 2022-03-09 DIAGNOSIS — R0789 Other chest pain: Secondary | ICD-10-CM | POA: Insufficient documentation

## 2022-03-09 MED ORDER — AZITHROMYCIN 250 MG PO TABS: ORAL_TABLET | ORAL | 0 refills | Status: AC

## 2022-03-09 NOTE — Assessment & Plan Note (Signed)
Taking protonix 40 mg daily and not controlled. Has been referred to GI and did not answer call. Given their contact number to call and schedule visit. She needs EGD as she is vomiting with eating and persistent GERD symptoms on adequate treatment.  ?

## 2022-03-09 NOTE — Assessment & Plan Note (Signed)
Flu shot up to date. Covid-19 counseled. Shingrix counseled declines today. Tetanus up to date. Colonoscopy prior GI referral given their number to call and schedule. Mammogram up to date, pap smear up to date. Counseled about sun safety and mole surveillance. Counseled about the dangers of distracted driving. Given 10 year screening recommendations.  ? ?

## 2022-03-09 NOTE — Assessment & Plan Note (Signed)
BP at goal on amlodipine 5 mg daily. Continue and checking CMP.  ?

## 2022-03-09 NOTE — Assessment & Plan Note (Signed)
Checking HgA1c for monitoring.  

## 2022-03-09 NOTE — Assessment & Plan Note (Signed)
Referral to neurosurgery as this is worsening and interfering with work. Rx meloxicam 15 mg daily for pain in meantime.  ?

## 2022-03-09 NOTE — Assessment & Plan Note (Signed)
Sounds more musculoskeletal although no known trigger. Checking CXR to rule out pneumonia. EKG done today without change from 2019. Checking troponin, CK, BNP, CBC, CMP to rule out other etiology as intense and new within 2 weeks.  ?

## 2022-03-09 NOTE — Assessment & Plan Note (Signed)
New headache temporal region left eye without vision changes. Checking ESR to rule out temporal arteritis.  ?

## 2022-03-15 ENCOUNTER — Other Ambulatory Visit: Payer: Self-pay | Admitting: Internal Medicine

## 2022-03-15 ENCOUNTER — Encounter: Payer: Self-pay | Admitting: Physician Assistant

## 2022-03-15 DIAGNOSIS — Z1231 Encounter for screening mammogram for malignant neoplasm of breast: Secondary | ICD-10-CM

## 2022-03-30 NOTE — Progress Notes (Signed)
? ? ? ?03/31/2022 ?Tami Martin ?[id number] ?12/07/1970 ? ?Referring provider: Hoyt Koch, * ?Primary GI doctor: Dr. Henrene Pastor ? ?ASSESSMENT AND PLAN:  ? ?Gastroesophageal reflux disease, unspecified whether esophagitis present ?-     US ABDOMEN LIMITED RUQ (LIVER/GB); Future ?-     pantoprazole (PROTONIX) 40 MG tablet; Take 1 tablet (40 mg total) by mouth 2 (two) times daily before a meal. ?Complaining of persistent GERD symptoms despite PPI therapy ?Will increase PPI to BID, avoid NSAIDS ?Will get RUQ AB [place] since can have some right upper quadrant pain  ?EGD will be scheduled with colonoscopy.  ?Will schedule EGD to evaluate for possible H. pylori, esophagitis, gastritis, peptic ulcer disease, etc.. ?I discussed risks of EGD with patient today, including risk of sedation, bleeding or perforation.  ?Patient provides understanding and gave verbal consent to proceed. ? ?Screen for colon cancer ?We have discussed the risks of bleeding, infection, perforation, medication reactions, and remote risk of death associated with colonoscopy. All questions were answered and the patient acknowledges these risk and wishes to proceed. ? ?Chest pain, unspecified type ?Never with exertion, no SOB/palpitations, no leg swelling/warmth  ?+ tenderness to palpation, possible costochondritis, will get salon pas patches over the counter and voltern gel.  ?Er precautions discussed.  ? ? ?Patient Care Team: ?Hoyt Koch, MD as PCP - General (Internal Medicine) ? ?HISTORY OF PRESENT ILLNESS: ?51 y.o. female , native of the [place], with a past medical history of hypertension, prediabetes, degenerative disc disease with Mobic listed on her medication list, GERD on pantoprazole 40 mg daily.  And others listed below presents for evaluation of GERD.  ? ?05/07/2013 EGD unremarkable ?05/09/2013 CT abdomen pelvis with and without contrast to evaluate pancreas was negative.  Showed scattered subcentimeter  low-attenuation lesions in the liver too small to characterize, normal gallbladder, adrenal glands, kidneys, spleen, pancreas stomach and bowel.  Normal appendix. ?06/04/2013 saw Dr. Henrene Pastor in the office complaining of abdominal pain, weight loss, Librax was helping abdominal discomfort.Marland Kitchen ?Has not had screening colonoscopy. ? ?She went to PCP on 03/08/22 for chest pain, had CXR that showed left basilar pulmonary infiltrate, took zpak.  ?She has left sided substernal chest pain, moves to her back and worse if she carries something. Worse with deep breath in.  ?She will have nausea but does not have vomiting. No GERD, nausea, vomiting while laying down at night.  ?She just takes tylenol, just started on mobic by PCP for the chest pain.  ?She denies dysphagia, melena, AB pain.  ?She reports ETOH use 1-2 x a week, wine.  ?No unintentional weight loss, no night sweats. ?She reports family history of gallbladder issues.  ? ?Patient denies change in bowel habits, constipation, diarrhea, hematochezia.  ?Denies changes in appetite, unintentional weight loss.  ?Patient denies family history of colon cancer or other gastrointestinal malignancies. ? ? ?Current Medications:  ? ? ?Current Outpatient Medications (Cardiovascular):  ?  amLODipine (NORVASC) 5 MG tablet, Take 1 tablet (5 mg total) by mouth daily. ? ? ?Current Outpatient Medications (Analgesics):  ?  meloxicam (MOBIC) 15 MG tablet, Take 1 tablet (15 mg total) by mouth daily. ? ? ?Current Outpatient Medications (Other):  ?  pantoprazole (PROTONIX) 40 MG tablet, Take 1 tablet (40 mg total) by mouth 2 (two) times daily before a meal. ? ?Medical History:  ?Past Medical History:  ?Diagnosis Date  ? Allergy   ? Seasonal allergies   ? DDD (degenerative disc disease), lumbosacral 2011  ?  GERD (gastroesophageal reflux disease)   ? denies today 07/2016  ? Prediabetes 2014  ? ?Allergies: No Known Allergies  ? ?Surgical History:  ?She  has a past surgical history that includes Knee  surgery (Right, 2020). ?Family History:  ?Her family history is not on file. ?Social History:  ? reports that she has never smoked. She has never used smokeless tobacco. She reports current alcohol use. She reports that she does not use drugs. ? ?REVIEW OF SYSTEMS  : All other systems reviewed and negative except where noted in the History of Present Illness. ? ? ?PHYSICAL EXAM: ?BP (!) 142/88   Pulse 76   Ht 5' 6.25" (1.683 m)   Wt 215 lb (97.5 kg)   LMP 11/16/2019   BMI 34.44 kg/m?  ?General:   Pleasant, well developed female in no acute distress ?Head:   Normocephalic and atraumatic. ?Eyes:  sclerae anicteric,conjunctive pink  ?Heart:   regular rate and rhythm ?Pulm:  Clear anteriorly; no wheezing. + left upper chest pain to palpation.  ?Abdomen:   Soft, Obese AB, Active bowel sounds. mild tenderness in the epigastrium and in the RUQ. Negative murpy'sWithout guarding and Without rebound, No organomegaly appreciated. ?Extremities:  Without edema, warmth, erythema, neg homen's. Left knee with well healed vertical scar.  ?Msk: Symmetrical without gross deformities. Peripheral pulses intact.  ?Neurologic:  Alert and  oriented x4;  No focal deficits.  ?Skin:   Dry and intact without significant lesions or rashes. ?Psychiatric:  Cooperative. Normal mood and affect. ? ? ? ?Vladimir Crofts, PA-C ?11:25 AM ? ? ?

## 2022-03-31 ENCOUNTER — Ambulatory Visit (INDEPENDENT_AMBULATORY_CARE_PROVIDER_SITE_OTHER): Payer: Medicaid Other | Admitting: Physician Assistant

## 2022-03-31 ENCOUNTER — Encounter: Payer: Self-pay | Admitting: Physician Assistant

## 2022-03-31 VITALS — BP 142/88 | HR 76 | Ht 66.25 in | Wt 215.0 lb

## 2022-03-31 DIAGNOSIS — R079 Chest pain, unspecified: Secondary | ICD-10-CM | POA: Diagnosis not present

## 2022-03-31 DIAGNOSIS — K219 Gastro-esophageal reflux disease without esophagitis: Secondary | ICD-10-CM | POA: Diagnosis not present

## 2022-03-31 DIAGNOSIS — Z1211 Encounter for screening for malignant neoplasm of colon: Secondary | ICD-10-CM

## 2022-03-31 MED ORDER — NA SULFATE-K SULFATE-MG SULF 17.5-3.13-1.6 GM/177ML PO SOLN: 1.0000 | Freq: Once | ORAL | 0 refills | Status: AC

## 2022-03-31 MED ORDER — PANTOPRAZOLE SODIUM 40 MG PO TBEC: 40.0000 mg | DELAYED_RELEASE_TABLET | Freq: Two times a day (BID) | ORAL | 0 refills | Status: DC

## 2022-03-31 NOTE — Patient Instructions (Signed)
?PLEASE GET salon pas patches are over the counter and voltern gel is topical antiinflammatory ?CAN USE THESE ON THE AREAS THAT HURT! ? ?INCREASE YOUR PROTONIX TO TWICE A DAY, THIS MAY HELP YOU CHEST PAIN.  ?WILL SCHEDULE FOR COLON/EGD ?Avoid spicy and acidic foods ?Avoid fatty foods ?Limit your intake of coffee, tea, alcohol, and carbonated drinks ?Work to maintain a healthy weight ?Keep the head of the bed elevated at least 3 inches with blocks or a wedge pillow if you are having any nighttime symptoms ?Stay upright for 2 hours after eating ?Avoid meals and snacks three to four hours before bedtime ? ? ?You have been scheduled for an abdominal ultrasound at [facility] (1st floor of hospital) on 04/04/22 at 8:00am. Please arrive 15 minutes prior to your appointment for registration. Make certain not to have anything to eat or drink 6 hours prior to your appointment. Should you need to reschedule your appointment, please contact radiology at [phone]. This test typically takes about 30 minutes to perform. ? ?Due to recent changes in healthcare laws, you may see the results of your imaging and laboratory studies on MyChart before your provider has had a chance to review them.  We understand that in some cases there may be results that are confusing or concerning to you. Not all laboratory results come back in the same time frame and the provider may be waiting for multiple results in order to interpret others.  Please give [place] 48 hours in order for your provider to thoroughly review all the results before contacting the office for clarification of your results.  ? ? ? ? ?Costochondritis ? ?Costochondritis is irritation and swelling (inflammation) of the tissue that connects the ribs to the breastbone (sternum). This tissue is called cartilage. ?Costochondritis causes pain in the front of the chest. Usually, the pain: ?Starts slowly. ?Is in more than one rib. ?What are the causes? ?The exact cause of this  condition is not always known. It results from stress on the tissue in the affected area. The cause of this stress could be: ?Chest injury. ?Exercise or activity, such as lifting. ?Very bad coughing. ?What increases the risk? ?You are more likely to develop this condition if you: ?Are female. ?Are 63-18 years old. ?Recently started a new exercise or work activity. ?Have low levels of vitamin D. ?Have a condition that makes you cough often. ?What are the signs or symptoms? ?The main symptom of this condition is chest pain. The pain: ?Usually starts slowly and can be sharp or dull. ?Gets worse with deep breathing, coughing, or exercise. ?Gets better with rest. ?May be worse when you press on the affected area of your ribs and breastbone. ?How is this treated? ?This condition usually goes away on its own over time. Your doctor may prescribe an NSAID, such as ibuprofen. This can help reduce pain and inflammation. Treatment may also include: ?Resting and avoiding activities that make pain worse. ?Putting heat or ice on the painful area. ?Doing exercises to stretch your chest muscles. ?If these treatments do not help, your doctor may inject a numbing medicine to help relieve the pain. ?Follow these instructions at home: ?Managing pain, stiffness, and swelling ? ?  ? ?If told, put ice on the painful area. To do this: ?Put ice in a plastic bag. ?Place a towel between your skin and the bag. ?Leave the ice on for 20 minutes, 2-3 times a day. ?If told, put heat on the affected area. Do this  as often as told by your doctor. Use the heat source that your doctor recommends, such as a moist heat pack or a heating pad. ?Place a towel between your skin and the heat source. ?Leave the heat on for 20-30 minutes. ?Take off the heat if your skin turns bright red. This is very important if you cannot feel pain, heat, or cold. You may have a greater risk of getting burned. ?Activity ?Rest as told by your doctor. ?Do not do anything that  makes your pain worse. This includes any activities that use chest, belly (abdomen), and side muscles. ?Do not lift anything that is heavier than 10 lb (4.5 kg), or the limit that you are told, until your doctor says that it is safe. ?Return to your normal activities as told by your doctor. Ask your doctor what activities are safe for you. ?General instructions ?Take over-the-counter and prescription medicines only as told by your doctor. ?Keep all follow-up visits as told by your doctor. This is important. ?Contact a doctor if: ?You have chills or a fever. ?Your pain does not go away or it gets worse. ?You have a cough that does not go away. ?Get help right away if: ?You are short of breath. ?You have very bad chest pain that is not helped by medicines, heat, or ice. ?These symptoms may be an emergency. Do not wait to see if the symptoms will go away. Get medical help right away. Call your local emergency services (911 in the [place] Do not drive yourself to the hospital. ?Summary ?Costochondritis is irritation and swelling (inflammation) of the tissue that connects the ribs to the breastbone (sternum). ?This condition causes pain in the front of the chest. ?Treatment may include medicines, rest, heat or ice, and exercises. ?This information is not intended to replace advice given to you by your health care provider. Make sure you discuss any questions you have with your health care provider. ?Document Revised: 10/03/2019 Document Reviewed: 10/03/2019 ?Elsevier Patient Education ? 2023 [employer] ? ? ?

## 2022-03-31 NOTE — Progress Notes (Signed)
Assessment and plans reviewed  

## 2022-04-04 ENCOUNTER — Ambulatory Visit (HOSPITAL_COMMUNITY)
Admission: RE | Admit: 2022-04-04 | Discharge: 2022-04-04 | Disposition: A | Discharge status: Home / Self Care | Payer: Medicaid Other | Source: Ambulatory Visit | Attending: Physician Assistant | Admitting: Physician Assistant

## 2022-04-04 DIAGNOSIS — K219 Gastro-esophageal reflux disease without esophagitis: Secondary | ICD-10-CM | POA: Diagnosis not present

## 2022-04-05 ENCOUNTER — Encounter: Payer: Self-pay | Admitting: Internal Medicine

## 2022-04-10 ENCOUNTER — Ambulatory Visit
Admission: RE | Admit: 2022-04-10 | Discharge: 2022-04-10 | Disposition: A | Discharge status: Home / Self Care | Payer: Medicaid Other | Source: Ambulatory Visit | Attending: Internal Medicine | Admitting: Internal Medicine

## 2022-04-10 DIAGNOSIS — Z1231 Encounter for screening mammogram for malignant neoplasm of breast: Secondary | ICD-10-CM

## 2022-04-12 ENCOUNTER — Ambulatory Visit (AMBULATORY_SURGERY_CENTER): Payer: Medicaid Other | Admitting: Internal Medicine

## 2022-04-12 ENCOUNTER — Encounter: Payer: Self-pay | Admitting: Internal Medicine

## 2022-04-12 ENCOUNTER — Encounter: Payer: Medicaid Other | Admitting: Internal Medicine

## 2022-04-12 VITALS — BP 118/68 | HR 76 | Temp 97.7°F | Resp 17 | Ht 66.0 in | Wt 215.0 lb

## 2022-04-12 DIAGNOSIS — K219 Gastro-esophageal reflux disease without esophagitis: Secondary | ICD-10-CM | POA: Diagnosis not present

## 2022-04-12 DIAGNOSIS — Z1211 Encounter for screening for malignant neoplasm of colon: Secondary | ICD-10-CM

## 2022-04-12 DIAGNOSIS — K2970 Gastritis, unspecified, without bleeding: Secondary | ICD-10-CM

## 2022-04-12 DIAGNOSIS — K2950 Unspecified chronic gastritis without bleeding: Secondary | ICD-10-CM | POA: Diagnosis not present

## 2022-04-12 MED ORDER — SODIUM CHLORIDE 0.9 % IV SOLN: 500.0000 mL | Freq: Once | INTRAVENOUS | Status: DC

## 2022-04-12 NOTE — Progress Notes (Signed)
Pt's states no medical or surgical changes since previsit or office visit.  ? ?PT ate an apple at 10pm last night. She completed the prep and says she is having yellow liquid. ?

## 2022-04-12 NOTE — Progress Notes (Signed)
? ? ? ?03/31/2022 ?Tami Martin ?[id number] ?11/14/1971 ? ?Referring provider: [name], [name], * ?Primary GI doctor: Dr. Quan Cybulski ? ?ASSESSMENT AND PLAN:  ? ?Gastroesophageal reflux disease, unspecified whether esophagitis present ?-     US ABDOMEN LIMITED RUQ (LIVER/GB); Future ?-     pantoprazole (PROTONIX) 40 MG tablet; Take 1 tablet (40 mg total) by mouth 2 (two) times daily before a meal. ?Complaining of persistent GERD symptoms despite PPI therapy ?Will increase PPI to BID, avoid NSAIDS ?Will get RUQ AB US since can have some right upper quadrant pain  ?EGD will be scheduled with colonoscopy.  ?Will schedule EGD to evaluate for possible H. pylori, esophagitis, gastritis, peptic ulcer disease, etc.. ?I discussed risks of EGD with patient today, including risk of sedation, bleeding or perforation.  ?Patient provides understanding and gave verbal consent to proceed. ? ?Screen for colon cancer ?We have discussed the risks of bleeding, infection, perforation, medication reactions, and remote risk of death associated with colonoscopy. All questions were answered and the patient acknowledges these risk and wishes to proceed. ? ?Chest pain, unspecified type ?Never with exertion, no SOB/palpitations, no leg swelling/warmth  ?+ tenderness to palpation, possible costochondritis, will get salon pas patches over the counter and voltern gel.  ?Er precautions discussed.  ? ? ?Patient Care Team: ?[name], [name], MD as PCP - General (Internal Medicine) ? ?HISTORY OF PRESENT ILLNESS: ?51 y.o. female , native of the [place], with a past medical history of hypertension, prediabetes, degenerative disc disease with Mobic listed on her medication list, GERD on pantoprazole 40 mg daily.  And others listed below presents for evaluation of GERD.  ? ?05/07/2013 EGD unremarkable ?05/09/2013 CT abdomen pelvis with and without contrast to evaluate pancreas was negative.  Showed scattered subcentimeter  low-attenuation lesions in the liver too small to characterize, normal gallbladder, adrenal glands, kidneys, spleen, pancreas stomach and bowel.  Normal appendix. ?06/04/2013 saw Dr. Terez Freimark in the office complaining of abdominal pain, weight loss, Librax was helping abdominal discomfort.. ?Has not had screening colonoscopy. ? ?She went to PCP on 03/08/22 for chest pain, had CXR that showed left basilar pulmonary infiltrate, took zpak.  ?She has left sided substernal chest pain, moves to her back and worse if she carries something. Worse with deep breath in.  ?She will have nausea but does not have vomiting. No GERD, nausea, vomiting while laying down at night.  ?She just takes tylenol, just started on mobic by PCP for the chest pain.  ?She denies dysphagia, melena, AB pain.  ?She reports ETOH use 1-2 x a week, wine.  ?No unintentional weight loss, no night sweats. ?She reports family history of gallbladder issues.  ? ?Patient denies change in bowel habits, constipation, diarrhea, hematochezia.  ?Denies changes in appetite, unintentional weight loss.  ?Patient denies family history of colon cancer or other gastrointestinal malignancies. ? ? ?Current Medications:  ? ? ?Current Outpatient Medications (Cardiovascular):  ?  amLODipine (NORVASC) 5 MG tablet, Take 1 tablet (5 mg total) by mouth daily. ? ? ?Current Outpatient Medications (Analgesics):  ?  meloxicam (MOBIC) 15 MG tablet, Take 1 tablet (15 mg total) by mouth daily. ? ? ?Current Outpatient Medications (Other):  ?  pantoprazole (PROTONIX) 40 MG tablet, Take 1 tablet (40 mg total) by mouth 2 (two) times daily before a meal. ? ?Medical History:  ?Past Medical History:  ?Diagnosis Date  ? Allergy   ? Seasonal allergies   ? DDD (degenerative disc disease), lumbosacral 2011  ?   GERD (gastroesophageal reflux disease)   ? denies today 07/2016  ? Prediabetes 2014  ? ?Allergies: No Known Allergies  ? ?Surgical History:  ?She  has a past surgical history that includes Knee  surgery (Right, 2020). ?Family History:  ?Her family history is not on file. ?Social History:  ? reports that she has never smoked. She has never used smokeless tobacco. She reports current alcohol use. She reports that she does not use drugs. ? ?REVIEW OF SYSTEMS  : All other systems reviewed and negative except where noted in the History of Present Illness. ? ? ?PHYSICAL EXAM: ?BP (!) 142/88   Pulse 76   Ht 5' 6.25" (1.683 m)   Wt 215 lb (97.5 kg)   LMP 11/16/2019   BMI 34.44 kg/m?  ?General:   Pleasant, well developed female in no acute distress ?Head:   Normocephalic and atraumatic. ?Eyes:  sclerae anicteric,conjunctive pink  ?Heart:   regular rate and rhythm ?Pulm:  Clear anteriorly; no wheezing. + left upper chest pain to palpation.  ?Abdomen:   Soft, Obese AB, Active bowel sounds. mild tenderness in the epigastrium and in the RUQ. Negative murpy'sWithout guarding and Without rebound, No organomegaly appreciated. ?Extremities:  Without edema, warmth, erythema, neg homen's. Left knee with well healed vertical scar.  ?Msk: Symmetrical without gross deformities. Peripheral pulses intact.  ?Neurologic:  Alert and  oriented x4;  No focal deficits.  ?Skin:   Dry and intact without significant lesions or rashes. ?Psychiatric:  Cooperative. Normal mood and affect. ? ? ? ?Amanda R Collier, PA-C ?11:25 AM ? ? ?

## 2022-04-12 NOTE — Progress Notes (Signed)
Called to room to assist during endoscopic procedure.  Patient ID and intended procedure confirmed with present staff. Received instructions for my participation in the procedure from the performing physician.  

## 2022-04-12 NOTE — Patient Instructions (Signed)
NORMAL COLONOSCOPY- 10 YEAR RECALL!! ? ?Please continue your normal medications ? ?Await stomach biopsy results ? ? ?YOU HAD AN ENDOSCOPIC PROCEDURE TODAY AT [facility]:   Refer to the procedure report that was given to you for any specific questions about what was found during the examination.  If the procedure report does not answer your questions, please call your gastroenterologist to clarify.  If you requested that your care partner not be given the details of your procedure findings, then the procedure report has been included in a sealed envelope for you to review at your convenience later. ? ?YOU SHOULD EXPECT: Some feelings of bloating in the abdomen. Passage of more gas than usual.  Walking can help get rid of the air that was put into your GI tract during the procedure and reduce the bloating. If you had a lower endoscopy (such as a colonoscopy or flexible sigmoidoscopy) you may notice spotting of blood in your stool or on the toilet paper. If you underwent a bowel prep for your procedure, you may not have a normal bowel movement for a few days. ? ?Please Note:  You might notice some irritation and congestion in your nose or some drainage.  This is from the oxygen used during your procedure.  There is no need for concern and it should clear up in a day or so. ? ?SYMPTOMS TO REPORT IMMEDIATELY: ? ?Following lower endoscopy (colonoscopy or flexible sigmoidoscopy): ? Excessive amounts of blood in the stool ? Significant tenderness or worsening of abdominal pains ? Swelling of the abdomen that is new, acute ? Fever of 100?F or higher ? ?Following upper endoscopy (EGD) ? Vomiting of blood or coffee ground material ? New chest pain or pain under the shoulder blades ? Painful or persistently difficult swallowing ? New shortness of breath ? Fever of 100?F or higher ? Black, tarry-looking stools ? ?For urgent or emergent issues, a gastroenterologist can be reached at any hour by calling [phone]  [phone]. ?Do not use MyChart messaging for urgent concerns.  ? ? ?DIET:  We do recommend a small meal at first, but then you may proceed to your regular diet.  Drink plenty of fluids but you should avoid alcoholic beverages for 24 hours. ? ?ACTIVITY:  You should plan to take it easy for the rest of today and you should NOT DRIVE or use heavy machinery until tomorrow (because of the sedation medicines used during the test).   ? ?FOLLOW UP: ?Our staff will call the number listed on your records 48-72 hours following your procedure to check on you and address any questions or concerns that you may have regarding the information given to you following your procedure. If we do not reach you, we will leave a message.  We will attempt to reach you two times.  During this call, we will ask if you have developed any symptoms of COVID 19. If you develop any symptoms (ie: fever, flu-like symptoms, shortness of breath, cough etc.) before then, please call [phone].  If you test positive for Covid 19 in the 2 weeks post procedure, please call and report this information to [place].   ? ?If any biopsies were taken you will be contacted by phone or by letter within the next 1-3 weeks.  Please call us at [phone] if you have not heard about the biopsies in 3 weeks.  ? ? ?SIGNATURES/CONFIDENTIALITY: ?You and/or your care partner have signed paperwork which will be entered into your  electronic medical record.  These signatures attest to the fact that that the information above on your After Visit Summary has been reviewed and is understood.  Full responsibility of the confidentiality of this discharge information lies with you and/or your care-partner. ? ?

## 2022-04-12 NOTE — Progress Notes (Signed)
Report to pacu rn; vss. ?

## 2022-04-12 NOTE — Op Note (Signed)
[facility] ?Patient Name: Tami Martin ?Procedure Date: 04/12/2022 3:58 PM ?MRN: [id number] ?Endoscopist: Wilhemina BonitoJohn N. Marina GoodellPerry , MD ?Age: [age] ?Referring MD:  ?Date of Birth: 03/18/1971 ?Gender: Female ?Account #: [id number] ?Procedure:                Upper GI endoscopy with biopsies ?Indications:              Abdominal pain in the right upper quadrant,  ?                          Esophageal reflux. Negative ultrasound. Better on  ?                          PPI. ?Medicines:                Monitored Anesthesia Care ?Procedure:                Pre-Anesthesia Assessment: ?                          - Prior to the procedure, a History and Physical  ?                          was performed, and patient medications and  ?                          allergies were reviewed. The patient's tolerance of  ?                          previous anesthesia was also reviewed. The risks  ?                          and benefits of the procedure and the sedation  ?                          options and risks were discussed with the patient.  ?                          All questions were answered, and informed consent  ?                          was obtained. Prior Anticoagulants: The patient has  ?                          taken no previous anticoagulant or antiplatelet  ?                          agents. ASA Grade Assessment: II - A patient with  ?                          mild systemic disease. After reviewing the risks  ?                          and benefits, the patient was deemed in  ?  satisfactory condition to undergo the procedure. ?                          After obtaining informed consent, the endoscope was  ?                          passed under direct vision. Throughout the  ?                          procedure, the patient's blood pressure, pulse, and  ?                          oxygen saturations were monitored continuously. The  ?                          GIF [id number] #[id number] was introduced  through the  ?                          mouth, and advanced to the second part of duodenum.  ?                          The upper GI endoscopy was accomplished without  ?                          difficulty. The patient tolerated the procedure  ?                          well. ?Scope In: ?Scope Out: ?Findings:                 The esophagus was normal. ?                          The stomach was normal. Biopsies were taken with a  ?                          cold forceps for Helicobacter pylori histologic  ?                          testing. ?                          The examined duodenum was normal. ?                          The cardia and gastric fundus were normal on  ?                          retroflexion. ?Complications:            No immediate complications. ?Estimated Blood Loss:     Estimated blood loss: none. ?Impression:               - Normal esophagus. ?                          - Normal stomach. Biopsied. ?                          -  Normal examined duodenum. ?Recommendation:           - Patient has a contact number available for  ?                          emergencies. The signs and symptoms of potential  ?                          delayed complications were discussed with the  ?                          patient. Return to normal activities tomorrow.  ?                          Written discharge instructions were provided to the  ?                          patient. ?                          - Resume previous diet. ?                          - Continue present medications. ?                          - Await pathology results. ?Wilhemina Bonito. Marina Goodell, MD ?04/12/2022 4:47:42 PM ?This report has been signed electronically. ?

## 2022-04-12 NOTE — Op Note (Signed)
[facility] ?Patient Name: Tami Martin ?Procedure Date: 04/12/2022 3:58 PM ?MRN: [id number] ?Endoscopist: Wilhemina Bonito. Marina Goodell , MD ?Age: 51 ?Referring MD:  ?Date of Birth: January 08, 1971 ?Gender: Female ?Account #: [id number] ?Procedure:                Colonoscopy ?Indications:              Screening for colorectal malignant neoplasm ?Medicines:                Monitored Anesthesia Care ?Procedure:                Pre-Anesthesia Assessment: ?                          - Prior to the procedure, a History and Physical  ?                          was performed, and patient medications and  ?                          allergies were reviewed. The patient's tolerance of  ?                          previous anesthesia was also reviewed. The risks  ?                          and benefits of the procedure and the sedation  ?                          options and risks were discussed with the patient.  ?                          All questions were answered, and informed consent  ?                          was obtained. Prior Anticoagulants: The patient has  ?                          taken no previous anticoagulant or antiplatelet  ?                          agents. ASA Grade Assessment: II - A patient with  ?                          mild systemic disease. After reviewing the risks  ?                          and benefits, the patient was deemed in  ?                          satisfactory condition to undergo the procedure. ?                          After obtaining informed consent, the colonoscope  ?  was passed under direct vision. Throughout the  ?                          procedure, the patient's blood pressure, pulse, and  ?                          oxygen saturations were monitored continuously. The  ?                          CF [id number] #[id number] was introduced through the anus  ?                          and advanced to the the cecum, identified by  ?                          appendiceal  orifice and ileocecal valve. The  ?                          ileocecal valve, appendiceal orifice, and rectum  ?                          were photographed. The quality of the bowel  ?                          preparation was excellent. The colonoscopy was  ?                          performed without difficulty. The patient tolerated  ?                          the procedure well. The bowel preparation used was  ?                          SUPREP via split dose instruction. ?Scope In: 4:19:34 PM ?Scope Out: 4:34:17 PM ?Scope Withdrawal Time: 0 hours 10 minutes 58 seconds  ?Total Procedure Duration: 0 hours 14 minutes 43 seconds  ?Findings:                 The entire examined colon appeared normal on direct  ?                          and retroflexion views. ?Complications:            No immediate complications. Estimated blood loss:  ?                          None. ?Estimated Blood Loss:     Estimated blood loss: none. ?Impression:               - The entire examined colon is normal on direct and  ?                          retroflexion views. ?                          - No  specimens collected. ?Recommendation:           - Repeat colonoscopy in 10 years for screening  ?                          purposes. ?                          - Patient has a contact number available for  ?                          emergencies. The signs and symptoms of potential  ?                          delayed complications were discussed with the  ?                          patient. Return to normal activities tomorrow.  ?                          Written discharge instructions were provided to the  ?                          patient. ?                          - Resume previous diet. ?                          - Continue present medications. ?Wilhemina BonitoJohn N. Marina GoodellPerry, MD ?04/12/2022 4:37:38 PM ?This report has been signed electronically. ?

## 2022-04-14 ENCOUNTER — Telehealth: Payer: Self-pay

## 2022-04-14 NOTE — Telephone Encounter (Signed)
?  Follow up Call- ? ? ?  04/12/2022  ?  3:05 PM  ?Call back number  ?Post procedure Call Back phone  # [phone]  ?Permission to leave phone message Yes  ?  ? ?Patient questions: ? ?Do you have a fever, pain , or abdominal swelling? No. ?Pain Score  0 * ? ?Have you tolerated food without any problems? Yes.   ? ?Have you been able to return to your normal activities? Yes.   ? ?Do you have any questions about your discharge instructions: ?Diet   No. ?Medications  No. ?Follow up visit  No. ? ?Do you have questions or concerns about your Care? No. ? ?Actions: ?* If pain score is 4 or above: ?No action needed, pain <4. ? ? ?

## 2022-04-18 ENCOUNTER — Encounter: Payer: Self-pay | Admitting: Internal Medicine

## 2022-04-20 ENCOUNTER — Telehealth: Payer: Self-pay | Admitting: Internal Medicine

## 2022-04-20 NOTE — Telephone Encounter (Signed)
I need orders placed for EKG performed on 03/08/2022 - thank you!

## 2022-05-12 ENCOUNTER — Other Ambulatory Visit: Payer: Self-pay | Admitting: Internal Medicine

## 2022-05-12 NOTE — Addendum Note (Signed)
Addended by: Hillard Danker A on: 05/12/2022 01:01 PM   Modules accepted: Orders

## 2022-07-06 ENCOUNTER — Encounter: Payer: Self-pay | Admitting: Family Medicine

## 2022-07-06 ENCOUNTER — Ambulatory Visit (INDEPENDENT_AMBULATORY_CARE_PROVIDER_SITE_OTHER): Payer: Medicaid Other | Admitting: Family Medicine

## 2022-07-06 VITALS — BP 142/92 | HR 70 | Temp 97.4°F | Ht 66.0 in | Wt 211.0 lb

## 2022-07-06 DIAGNOSIS — R350 Frequency of micturition: Secondary | ICD-10-CM

## 2022-07-06 DIAGNOSIS — R10823 Right lower quadrant rebound abdominal tenderness: Secondary | ICD-10-CM | POA: Diagnosis not present

## 2022-07-06 DIAGNOSIS — N3000 Acute cystitis without hematuria: Secondary | ICD-10-CM | POA: Diagnosis not present

## 2022-07-06 DIAGNOSIS — R1031 Right lower quadrant pain: Secondary | ICD-10-CM

## 2022-07-06 DIAGNOSIS — R109 Unspecified abdominal pain: Secondary | ICD-10-CM | POA: Insufficient documentation

## 2022-07-06 LAB — POCT URINALYSIS DIPSTICK
Blood, UA: NEGATIVE
Glucose, UA: NEGATIVE
Ketones, UA: NEGATIVE
Nitrite, UA: NEGATIVE
Protein, UA: POSITIVE — AB
Spec Grav, UA: 1.025 (ref 1.010–1.025)
Urobilinogen, UA: 0.2 E.U./dL
pH, UA: 6 (ref 5.0–8.0)

## 2022-07-06 LAB — CBC WITH DIFFERENTIAL/PLATELET
Basophils Absolute: 0.1 10*3/uL (ref 0.0–0.1)
Basophils Relative: 1.1 % (ref 0.0–3.0)
Eosinophils Absolute: 0.2 10*3/uL (ref 0.0–0.7)
Eosinophils Relative: 4 % (ref 0.0–5.0)
HCT: 42.5 % (ref 36.0–46.0)
Hemoglobin: 14.2 g/dL (ref 12.0–15.0)
Lymphocytes Relative: 48.7 % — ABNORMAL HIGH (ref 12.0–46.0)
Lymphs Abs: 2.3 10*3/uL (ref 0.7–4.0)
MCHC: 33.5 g/dL (ref 30.0–36.0)
MCV: 83.9 fl (ref 78.0–100.0)
Monocytes Absolute: 0.5 10*3/uL (ref 0.1–1.0)
Monocytes Relative: 10.9 % (ref 3.0–12.0)
Neutro Abs: 1.7 10*3/uL (ref 1.4–7.7)
Neutrophils Relative %: 35.3 % — ABNORMAL LOW (ref 43.0–77.0)
Platelets: 310 10*3/uL (ref 150.0–400.0)
RBC: 5.07 Mil/uL (ref 3.87–5.11)
RDW: 12.5 % (ref 11.5–15.5)
WBC: 4.8 10*3/uL (ref 4.0–10.5)

## 2022-07-06 LAB — COMPREHENSIVE METABOLIC PANEL
ALT: 35 U/L (ref 0–35)
AST: 43 U/L — ABNORMAL HIGH (ref 0–37)
Albumin: 3.7 g/dL (ref 3.5–5.2)
Alkaline Phosphatase: 78 U/L (ref 39–117)
BUN: 10 mg/dL (ref 6–23)
CO2: 28 mEq/L (ref 19–32)
Calcium: 9.3 mg/dL (ref 8.4–10.5)
Chloride: 104 mEq/L (ref 96–112)
Creatinine, Ser: 0.62 mg/dL (ref 0.40–1.20)
GFR: 102.99 mL/min (ref >60.00)
Glucose, Bld: 140 mg/dL — ABNORMAL HIGH (ref 70–99)
Potassium: 3.8 mEq/L (ref 3.5–5.1)
Sodium: 141 mEq/L (ref 135–145)
Total Bilirubin: 0.4 mg/dL (ref 0.2–1.2)
Total Protein: 7.4 g/dL (ref 6.0–8.3)

## 2022-07-06 LAB — POCT URINE PREGNANCY: Preg Test, Ur: NEGATIVE

## 2022-07-06 MED ORDER — NITROFURANTOIN MONOHYD MACRO 100 MG PO CAPS: 100.0000 mg | ORAL_CAPSULE | Freq: Two times a day (BID) | ORAL | 0 refills | Status: DC

## 2022-07-06 NOTE — Progress Notes (Signed)
Her labs show elevated blood sugar and slightly elevated liver enzyme test. Is CT of her abdomen and pelvis been scheduled? Please expedite if not. Thanks.

## 2022-07-06 NOTE — Assessment & Plan Note (Signed)
Macrobid ordered. Increase water intake. Symptoms and exam do not appear to be related to UTI

## 2022-07-06 NOTE — Assessment & Plan Note (Addendum)
CT abdomen/pelvis ordered. Labs ordered. She is not ill appearing. Try Tylenol for pain control. Advised that if her symptoms are worsening while waiting for CT or if she develops fever, vomiting or any other new symptoms that she will need to go to the ED

## 2022-07-06 NOTE — Patient Instructions (Signed)
You appear to have a urinary tract infection.  Please take the antibiotic as prescribed.  I have ordered a CT of your abdomen and pelvis and they will call you to schedule this.  If you develop any worsening symptoms such as increased pain, fever, vomiting then you will need to go to the emergency department for further evaluation.

## 2022-07-06 NOTE — Progress Notes (Signed)
Subjective:     Patient ID: Tami Martin, female    DOB: 14-Jul-1971, 51 y.o.   MRN: [id number]  Chief Complaint  Patient presents with   Abdominal Pain    Lower pelvic pain for about a week now. Feels as if she has to use the restroom but cannot like she used to.     Abdominal Pain   Patient is in today for lower abdominal pain x 1 wk. Pain is worse with movement.   Denies fever, chills, dizziness, chest pain, palpitations, shortness of breath, N/V/D or constipation.   No periods in over a year.   States she has not had sex in 6 years, her husband passed away.   C/o a one week hx of urinary frequency. No dysuria, urgency or hematuria.      Health Maintenance Due  Topic Date Due   HIV Screening  Never done   Hepatitis C Screening  Never done   PAP SMEAR-Modifier  04/02/2016   Zoster Vaccines- Shingrix (1 of 2) Never done   INFLUENZA VACCINE  07/04/2022    Past Medical History:  Diagnosis Date   Allergy    Seasonal allergies    DDD (degenerative disc disease), lumbosacral 2011   GERD (gastroesophageal reflux disease)    denies today 07/2016   Prediabetes 2014    Past Surgical History:  Procedure Laterality Date   KNEE SURGERY Right 2020    Family History  Problem Relation Age of Onset   Breast cancer Neg Hx     Social History   Socioeconomic History   Marital status: Legally Separated    Spouse name: Not on file   Number of children: 7   Years of education: 8   Highest education level: Not on file  Occupational History   Occupation: HOUSEKEEPING    Employer: [employer]   Tobacco Use   Smoking status: Never   Smokeless tobacco: Never  Substance and Sexual Activity   Alcohol use: Yes    Comment: once month-wine   Drug use: No   Sexual activity: Yes    Birth control/protection: Injection    Comment: Depo Provera every 3 months at [facility]  Other Topics Concern   Not on file  Social History Narrative   Originally from [place]   Came to [place] [employer] [place]. In 2010   Lives at home with her 5 younger children.     Her 2 older children live by themselves here in [place]   No support from her estranged husband   Social Determinants of Corporate investment banker Strain: Not on file  Food Insecurity: Not on file  Transportation Needs: Not on file  Physical Activity: Not on file  Stress: Not on file  Social Connections: Not on file  Intimate Partner Violence: Not on file    Outpatient Medications Prior to Visit  Medication Sig Dispense Refill   amLODipine (NORVASC) 5 MG tablet Take 1 tablet (5 mg total) by mouth daily. 90 tablet 3   meloxicam (MOBIC) 15 MG tablet Take 1 tablet (15 mg total) by mouth daily. 30 tablet 0   pantoprazole (PROTONIX) 40 MG tablet Take 1 tablet (40 mg total) by mouth 2 (two) times daily before a meal. 180 tablet 0   No facility-administered medications prior to visit.    No Known Allergies  Review of Systems  Gastrointestinal:  Positive for abdominal pain.       Objective:    Physical Exam  Constitutional:      General: She is not in acute distress.    Appearance: She is not ill-appearing.  Eyes:     General: No scleral icterus.    Extraocular Movements: Extraocular movements intact.  Cardiovascular:     Rate and Rhythm: Normal rate and regular rhythm.  Pulmonary:     Effort: Pulmonary effort is normal.     Breath sounds: Normal breath sounds.  Abdominal:     General: Abdomen is flat. Bowel sounds are normal. There is no distension.     Palpations: Abdomen is soft.     Tenderness: There is abdominal tenderness in the right lower quadrant. There is rebound. There is no right CVA tenderness, left CVA tenderness or guarding. Positive signs include Rovsing's sign and McBurney's sign. Negative signs include Murphy's sign.  Skin:    General: Skin is warm and dry.  Neurological:     General: No focal deficit present.     Mental Status: She is alert and oriented to  person, place, and time.  Psychiatric:        Mood and Affect: Mood normal.        Behavior: Behavior normal.     BP (!) 142/92 (BP Location: Left Arm, Patient Position: Sitting, Cuff Size: Large)   Pulse 70   Temp (!) 97.4 F (36.3 C) (Temporal)   Ht 5\' 6"  (1.676 m)   Wt 211 lb (95.7 kg)   LMP 11/16/2019   SpO2 98%   BMI 34.06 kg/m  Wt Readings from Last 3 Encounters:  07/06/22 211 lb (95.7 kg)  04/12/22 215 lb (97.5 kg)  03/31/22 215 lb (97.5 kg)   Urinary pregnancy negative UA shows leuks 3+, pro 1+, bil 1+, neg blood, glucose, nit     Assessment & Plan:   Problem List Items Addressed This Visit       Genitourinary   Acute cystitis without hematuria    Macrobid ordered. Increase water intake. Symptoms and exam do not appear to be related to UTI      Relevant Medications   nitrofurantoin, macrocrystal-monohydrate, (MACROBID) 100 MG capsule     Other   Right lower quadrant abdominal pain - Primary    CT abdomen/pelvis ordered. Labs ordered. She is not ill appearing. Try Tylenol for pain control. Advised that if her symptoms are worsening while waiting for CT or if she develops fever, vomiting or any other new symptoms that she will need to go to the ED      Relevant Orders   CT Abdomen Pelvis W Contrast   CBC with Differential/Platelet (Completed)   Comprehensive metabolic panel (Completed)   POCT urinalysis dipstick (Completed)   Urine Culture   Right lower quadrant abdominal tenderness with rebound tenderness   Relevant Orders   CT Abdomen Pelvis W Contrast   CBC with Differential/Platelet (Completed)   Comprehensive metabolic panel (Completed)   Other Visit Diagnoses     Urinary frequency       Relevant Orders   POCT urinalysis dipstick (Completed)   POCT urine pregnancy (Completed)   Urine Culture       I am having Tami Martin start on nitrofurantoin (macrocrystal-monohydrate). I am also having her maintain her amLODipine, meloxicam,  and pantoprazole.  Meds ordered this encounter  Medications   nitrofurantoin, macrocrystal-monohydrate, (MACROBID) 100 MG capsule    Sig: Take 1 capsule (100 mg total) by mouth 2 (two) times daily.    Dispense:  10 capsule  Refill:  0    Order Specific Question:   Supervising Provider    Answer:   Hillard Danker A [[id number]]

## 2022-07-07 LAB — URINE CULTURE: Result:: NO GROWTH

## 2022-08-08 ENCOUNTER — Ambulatory Visit (INDEPENDENT_AMBULATORY_CARE_PROVIDER_SITE_OTHER): Payer: Medicaid Other | Admitting: Internal Medicine

## 2022-08-08 ENCOUNTER — Encounter: Payer: Self-pay | Admitting: Internal Medicine

## 2022-08-08 VITALS — BP 122/80 | HR 74 | Temp 98.2°F | Ht 66.0 in | Wt 209.0 lb

## 2022-08-08 DIAGNOSIS — R7303 Prediabetes: Secondary | ICD-10-CM

## 2022-08-08 DIAGNOSIS — Z23 Encounter for immunization: Secondary | ICD-10-CM | POA: Diagnosis not present

## 2022-08-08 LAB — POCT GLYCOSYLATED HEMOGLOBIN (HGB A1C): Hemoglobin A1C: 6 % — AB (ref 4.0–5.6)

## 2022-08-08 NOTE — Patient Instructions (Signed)
You are doing good with the sugars.

## 2022-08-08 NOTE — Assessment & Plan Note (Signed)
POC HgA1c done today and improved to 6.0. Encouraged to keep up work with diet and exercise to help. Follow up in 6 months.

## 2022-08-08 NOTE — Progress Notes (Signed)
   Subjective:   Patient ID: Tami Martin, female    DOB: Nov 29, 1971, 51 y.o.   MRN: [id number]  HPI The patient is a 51 YO female coming in for follow up.  Review of Systems  Constitutional: Negative.   HENT: Negative.    Eyes: Negative.   Respiratory:  Negative for cough, chest tightness and shortness of breath.   Cardiovascular:  Negative for chest pain, palpitations and leg swelling.  Gastrointestinal:  Negative for abdominal distention, abdominal pain, constipation, diarrhea, nausea and vomiting.  Musculoskeletal: Negative.   Skin: Negative.   Neurological: Negative.   Psychiatric/Behavioral: Negative.      Objective:  Physical Exam Constitutional:      Appearance: She is well-developed.  HENT:     Head: Normocephalic and atraumatic.  Cardiovascular:     Rate and Rhythm: Normal rate and regular rhythm.  Pulmonary:     Effort: Pulmonary effort is normal. No respiratory distress.     Breath sounds: Normal breath sounds. No wheezing or rales.  Abdominal:     General: Bowel sounds are normal. There is no distension.     Palpations: Abdomen is soft.     Tenderness: There is no abdominal tenderness. There is no rebound.  Musculoskeletal:     Cervical back: Normal range of motion.  Skin:    General: Skin is warm and dry.  Neurological:     Mental Status: She is alert and oriented to person, place, and time.     Coordination: Coordination normal.     Vitals:   08/08/22 0958  BP: 122/80  Pulse: 74  Temp: 98.2 F (36.8 C)  TempSrc: Oral  SpO2: 96%  Weight: 209 lb (94.8 kg)  Height: 5\' 6"  (1.676 m)    Assessment & Plan:  Flu shot given at visit

## 2022-09-13 ENCOUNTER — Other Ambulatory Visit: Payer: Self-pay | Admitting: Internal Medicine

## 2022-09-13 DIAGNOSIS — I10 Essential (primary) hypertension: Secondary | ICD-10-CM

## 2022-12-02 ENCOUNTER — Other Ambulatory Visit: Payer: Self-pay | Admitting: Physician Assistant

## 2023-01-04 ENCOUNTER — Ambulatory Visit (INDEPENDENT_AMBULATORY_CARE_PROVIDER_SITE_OTHER): Payer: Medicaid Other | Admitting: Internal Medicine

## 2023-01-04 ENCOUNTER — Encounter: Payer: Self-pay | Admitting: Internal Medicine

## 2023-01-04 ENCOUNTER — Ambulatory Visit (INDEPENDENT_AMBULATORY_CARE_PROVIDER_SITE_OTHER): Payer: Medicaid Other

## 2023-01-04 VITALS — BP 124/80 | HR 67 | Temp 98.6°F | Ht 66.0 in | Wt 209.0 lb

## 2023-01-04 DIAGNOSIS — M545 Low back pain, unspecified: Secondary | ICD-10-CM

## 2023-01-04 DIAGNOSIS — M2142 Flat foot [pes planus] (acquired), left foot: Secondary | ICD-10-CM

## 2023-01-04 DIAGNOSIS — M542 Cervicalgia: Secondary | ICD-10-CM

## 2023-01-04 DIAGNOSIS — M2141 Flat foot [pes planus] (acquired), right foot: Secondary | ICD-10-CM | POA: Diagnosis not present

## 2023-01-04 DIAGNOSIS — M546 Pain in thoracic spine: Secondary | ICD-10-CM

## 2023-01-04 MED ORDER — PREDNISONE 20 MG PO TABS: 40.0000 mg | ORAL_TABLET | Freq: Every day | ORAL | 0 refills | Status: DC

## 2023-01-04 MED ORDER — MELOXICAM 15 MG PO TABS: 15.0000 mg | ORAL_TABLET | Freq: Every day | ORAL | 0 refills | Status: DC

## 2023-01-04 NOTE — Patient Instructions (Signed)
We have sent in prednisone to take 2 pills daily for 5 days.  We have sent in meloxicam to take 1 pill daily for up to 1 month,  We will check the x-rays of the back.

## 2023-01-04 NOTE — Progress Notes (Signed)
   Subjective:   Patient ID: Tami Martin, female    DOB: July 10, 1971, 52 y.o.   MRN: [id number]  HPI The patient is a 52 YO female coming in for back and neck pain since fall 12/12/22. Using otc without relief. Some chronic pain in left foot.  Review of Systems  Constitutional: Negative.   HENT: Negative.    Eyes: Negative.   Respiratory:  Negative for cough, chest tightness and shortness of breath.   Cardiovascular:  Negative for chest pain, palpitations and leg swelling.  Gastrointestinal:  Negative for abdominal distention, abdominal pain, constipation, diarrhea, nausea and vomiting.  Musculoskeletal:  Positive for arthralgias and back pain.  Skin: Negative.   Neurological: Negative.   Psychiatric/Behavioral: Negative.      Objective:  Physical Exam Constitutional:      Appearance: She is well-developed.  HENT:     Head: Normocephalic and atraumatic.  Cardiovascular:     Rate and Rhythm: Normal rate and regular rhythm.  Pulmonary:     Effort: Pulmonary effort is normal. No respiratory distress.     Breath sounds: Normal breath sounds. No wheezing or rales.  Abdominal:     General: Bowel sounds are normal. There is no distension.     Palpations: Abdomen is soft.     Tenderness: There is no abdominal tenderness. There is no rebound.  Musculoskeletal:        General: Tenderness present.     Cervical back: Normal range of motion.     Comments: Diffuse tenderness of spine from neck to lumbar  Skin:    General: Skin is warm and dry.  Neurological:     Mental Status: She is alert and oriented to person, place, and time.     Coordination: Coordination normal.     Vitals:   01/04/23 1009  BP: 124/80  Pulse: 67  Temp: 98.6 F (37 C)  TempSrc: Oral  SpO2: 98%  Weight: 209 lb (94.8 kg)  Height: 5\' 6"  (1.676 m)    Assessment & Plan:

## 2023-01-05 DIAGNOSIS — M546 Pain in thoracic spine: Secondary | ICD-10-CM | POA: Insufficient documentation

## 2023-01-05 DIAGNOSIS — M542 Cervicalgia: Secondary | ICD-10-CM | POA: Insufficient documentation

## 2023-01-05 NOTE — Assessment & Plan Note (Signed)
Rx prednisone 5 days and meloxicam 1 month.

## 2023-01-05 NOTE — Assessment & Plan Note (Signed)
Due to fall 12/12/22 and worsening. Tenderness over spine. Ordered x-ray thoracic. Rx prednisone 5 day and meloxicam 1 month.

## 2023-01-05 NOTE — Assessment & Plan Note (Signed)
Pain since fall 12/12/22 which is worsening. Ordered x-ray lumbar and rx 5 day prednisone and meloxicam 1 month.

## 2023-01-05 NOTE — Assessment & Plan Note (Signed)
Due to pain after fall >2 weeks ago and worsening checking x-ray cervical. Rx prednisone 5 day and meloxicam 1 month.

## 2023-03-14 LAB — HM DIABETES EYE EXAM

## 2023-03-22 ENCOUNTER — Other Ambulatory Visit: Payer: Self-pay | Admitting: Internal Medicine

## 2023-03-22 ENCOUNTER — Encounter: Payer: Self-pay | Admitting: Nurse Practitioner

## 2023-03-22 DIAGNOSIS — Z1231 Encounter for screening mammogram for malignant neoplasm of breast: Secondary | ICD-10-CM

## 2023-05-02 ENCOUNTER — Ambulatory Visit: Payer: Medicaid Other

## 2023-05-17 ENCOUNTER — Ambulatory Visit
Admission: RE | Admit: 2023-05-17 | Discharge: 2023-05-17 | Disposition: A | Discharge status: Home / Self Care | Payer: Medicaid Other | Source: Ambulatory Visit | Attending: Internal Medicine | Admitting: Internal Medicine

## 2023-05-17 DIAGNOSIS — Z1231 Encounter for screening mammogram for malignant neoplasm of breast: Secondary | ICD-10-CM

## 2023-05-23 ENCOUNTER — Encounter: Payer: Self-pay | Admitting: Internal Medicine

## 2023-05-23 LAB — HM MAMMOGRAPHY

## 2023-09-03 ENCOUNTER — Encounter: Payer: Self-pay | Admitting: Internal Medicine

## 2023-09-03 ENCOUNTER — Ambulatory Visit (INDEPENDENT_AMBULATORY_CARE_PROVIDER_SITE_OTHER): Payer: Medicaid Other | Admitting: Internal Medicine

## 2023-09-03 VITALS — BP 132/84 | HR 64 | Temp 98.8°F | Ht 66.0 in | Wt 200.0 lb

## 2023-09-03 DIAGNOSIS — R7303 Prediabetes: Secondary | ICD-10-CM

## 2023-09-03 DIAGNOSIS — M2141 Flat foot [pes planus] (acquired), right foot: Secondary | ICD-10-CM | POA: Diagnosis not present

## 2023-09-03 DIAGNOSIS — M2142 Flat foot [pes planus] (acquired), left foot: Secondary | ICD-10-CM

## 2023-09-03 DIAGNOSIS — I10 Essential (primary) hypertension: Secondary | ICD-10-CM

## 2023-09-03 DIAGNOSIS — J0110 Acute frontal sinusitis, unspecified: Secondary | ICD-10-CM

## 2023-09-03 LAB — COMPREHENSIVE METABOLIC PANEL
ALT: 15 U/L (ref 0–35)
AST: 16 U/L (ref 0–37)
Albumin: 3.7 g/dL (ref 3.5–5.2)
Alkaline Phosphatase: 78 U/L (ref 39–117)
BUN: 10 mg/dL (ref 6–23)
CO2: 26 meq/L (ref 19–32)
Calcium: 9.4 mg/dL (ref 8.4–10.5)
Chloride: 104 meq/L (ref 96–112)
Creatinine, Ser: 0.6 mg/dL (ref 0.40–1.20)
GFR: 102.96 mL/min (ref >60.00)
Glucose, Bld: 118 mg/dL — ABNORMAL HIGH (ref 70–99)
Potassium: 4 meq/L (ref 3.5–5.1)
Sodium: 139 meq/L (ref 135–145)
Total Bilirubin: 0.6 mg/dL (ref 0.2–1.2)
Total Protein: 7.4 g/dL (ref 6.0–8.3)

## 2023-09-03 LAB — LIPID PANEL
Cholesterol: 119 mg/dL (ref 0–200)
HDL: 46.5 mg/dL (ref >39.00)
LDL Cholesterol: 53 mg/dL (ref 0–99)
NonHDL: 72.92
Total CHOL/HDL Ratio: 3
Triglycerides: 98 mg/dL (ref 0.0–149.0)
VLDL: 19.6 mg/dL (ref 0.0–40.0)

## 2023-09-03 LAB — HEMOGLOBIN A1C: Hgb A1c MFr Bld: 6.3 % (ref 4.6–6.5)

## 2023-09-03 LAB — CBC
HCT: 43.6 % (ref 36.0–46.0)
Hemoglobin: 14.2 g/dL (ref 12.0–15.0)
MCHC: 32.7 g/dL (ref 30.0–36.0)
MCV: 87 fl (ref 78.0–100.0)
Platelets: 421 10*3/uL — ABNORMAL HIGH (ref 150.0–400.0)
RBC: 5.01 Mil/uL (ref 3.87–5.11)
RDW: 13.2 % (ref 11.5–15.5)
WBC: 4.7 10*3/uL (ref 4.0–10.5)

## 2023-09-03 MED ORDER — MELOXICAM 15 MG PO TABS: 15.0000 mg | ORAL_TABLET | Freq: Every day | ORAL | 6 refills | Status: DC

## 2023-09-03 MED ORDER — AMOXICILLIN-POT CLAVULANATE 875-125 MG PO TABS
1.0000 | ORAL_TABLET | Freq: Two times a day (BID) | ORAL | 0 refills | Status: DC
Start: 2023-09-03 — End: 2023-09-20

## 2023-09-03 NOTE — Assessment & Plan Note (Signed)
Checking HgA1c and adjust as needed.  

## 2023-09-03 NOTE — Assessment & Plan Note (Signed)
Symptoms going on about 3+ weeks now. Rx augmentin 10 day course. If no improvement needs brain imaging due to new onset headaches. I suspect these are sinus pressure related.

## 2023-09-03 NOTE — Patient Instructions (Signed)
We have sent in meloxicam to take for the pain.  We have sent in augmentin to take 1 pill twice a day for 10 days to clear the infection.  If the headaches do not improve let us know and we can check a scan of the brain.

## 2023-09-03 NOTE — Progress Notes (Signed)
   Subjective:   Patient ID: Tami Martin, female    DOB: 1971/11/17, 52 y.o.   MRN: [id number]  HPI The patient is a 52 YO female coming in for headaches and chills/hot flashes. Needs follow up sugars as well. Stopped taking BP meds due to running out of this.  Review of Systems  Constitutional:  Positive for activity change, appetite change and chills. Negative for fatigue, fever and unexpected weight change.  HENT:  Positive for congestion, postnasal drip, rhinorrhea and sinus pressure. Negative for ear discharge, ear pain, sinus pain, sneezing, sore throat, tinnitus, trouble swallowing and voice change.   Eyes: Negative.   Respiratory:  Negative for cough, chest tightness, shortness of breath and wheezing.   Cardiovascular: Negative.   Gastrointestinal: Negative.   Musculoskeletal:  Positive for myalgias.  Neurological:  Positive for headaches.    Objective:  Physical Exam Constitutional:      Appearance: She is well-developed.  HENT:     Head: Normocephalic and atraumatic.     Comments: Oropharynx with redness and clear drainage, nose with swollen turbinates, TMs normal bilaterally.  Neck:     Thyroid: No thyromegaly.  Cardiovascular:     Rate and Rhythm: Normal rate and regular rhythm.  Pulmonary:     Effort: Pulmonary effort is normal. No respiratory distress.     Breath sounds: Normal breath sounds. No wheezing or rales.  Abdominal:     Palpations: Abdomen is soft.  Musculoskeletal:        General: Tenderness present.     Cervical back: Normal range of motion.  Lymphadenopathy:     Cervical: No cervical adenopathy.  Skin:    General: Skin is warm and dry.  Neurological:     Mental Status: She is alert and oriented to person, place, and time.     Vitals:   09/03/23 1041  BP: 132/84  Pulse: 64  Temp: 98.8 F (37.1 C)  TempSrc: Oral  SpO2: 97%  Weight: 200 lb (90.7 kg)  Height: 5\' 6"  (1.676 m)    Assessment & Plan:

## 2023-09-03 NOTE — Assessment & Plan Note (Signed)
Needs refill of meloxicam 15 mg daily prn which is done today.

## 2023-09-03 NOTE — Assessment & Plan Note (Signed)
Ran out of medications and BP at goal. Will monitor off meds for now. Checking CMP today.

## 2023-09-20 ENCOUNTER — Ambulatory Visit: Payer: Medicaid Other | Admitting: Internal Medicine

## 2023-09-20 ENCOUNTER — Encounter: Payer: Self-pay | Admitting: Internal Medicine

## 2023-09-20 VITALS — BP 118/80 | HR 70 | Temp 98.3°F | Ht 66.0 in | Wt 199.0 lb

## 2023-09-20 DIAGNOSIS — G44201 Tension-type headache, unspecified, intractable: Secondary | ICD-10-CM | POA: Diagnosis not present

## 2023-09-20 DIAGNOSIS — Z23 Encounter for immunization: Secondary | ICD-10-CM

## 2023-09-20 DIAGNOSIS — G8929 Other chronic pain: Secondary | ICD-10-CM | POA: Diagnosis not present

## 2023-09-20 DIAGNOSIS — M5441 Lumbago with sciatica, right side: Secondary | ICD-10-CM

## 2023-09-20 MED ORDER — CYCLOBENZAPRINE HCL 5 MG PO TABS: 5.0000 mg | ORAL_TABLET | Freq: Three times a day (TID) | ORAL | 1 refills | Status: DC | PRN

## 2023-09-20 NOTE — Addendum Note (Signed)
Addended by: Levonne Lapping on: 09/20/2023 10:48 AM   Modules accepted: Orders

## 2023-09-20 NOTE — Assessment & Plan Note (Signed)
Referral to neurosurgery to assess long term options for pain control. Rx flexeril 5 mg TID prn to help with pain.

## 2023-09-20 NOTE — Assessment & Plan Note (Signed)
Recurrent and going on 2 weeks without relief. Ordered CT head and rx flexeril 5 mg TID prn for pain as meloxicam and tylenol have not helped.

## 2023-09-20 NOTE — Patient Instructions (Addendum)
We can try flexeril for the pain in the head and back. The low back pain is likely coming from arthritis and we will get you in with a back specialist to help.  We will check a scan of the head to see why you are having the headaches.

## 2023-09-20 NOTE — Progress Notes (Signed)
   Subjective:   Patient ID: Tami Martin, female    DOB: 1971-04-25, 52 y.o.   MRN: [id number]  Headache  Associated symptoms include back pain. Pertinent negatives include no fever.   The patient is a 52 YO female coming in for multiple concerns. Having head pains multiple weeks worse at night time. Also chronic but worse lately pain in low back going down right leg. No injury or change in activity lately. Taking meloxicam and tylenol for the pain without relief.   Review of Systems  Constitutional:  Positive for activity change. Negative for appetite change, chills, fatigue, fever and unexpected weight change.  Respiratory: Negative.    Cardiovascular: Negative.   Gastrointestinal: Negative.   Musculoskeletal:  Positive for arthralgias, back pain and myalgias. Negative for gait problem and joint swelling.  Skin: Negative.   Neurological:  Positive for headaches.    Objective:  Physical Exam Constitutional:      Appearance: She is well-developed.  HENT:     Head: Normocephalic and atraumatic.  Cardiovascular:     Rate and Rhythm: Normal rate and regular rhythm.  Pulmonary:     Effort: Pulmonary effort is normal. No respiratory distress.     Breath sounds: Normal breath sounds. No wheezing or rales.  Abdominal:     General: Bowel sounds are normal. There is no distension.     Palpations: Abdomen is soft.     Tenderness: There is no abdominal tenderness. There is no rebound.  Musculoskeletal:        General: Tenderness present.     Cervical back: Normal range of motion.  Skin:    General: Skin is warm and dry.  Neurological:     Mental Status: She is alert and oriented to person, place, and time.     Coordination: Coordination normal.     Vitals:   09/20/23 1023  BP: 118/80  Pulse: 70  Temp: 98.3 F (36.8 C)  TempSrc: Oral  SpO2: 99%  Weight: 199 lb (90.3 kg)  Height: 5\' 6"  (1.676 m)    Assessment & Plan:  Flu shot given at visit

## 2023-10-02 ENCOUNTER — Other Ambulatory Visit: Payer: Self-pay | Admitting: Internal Medicine

## 2023-10-02 DIAGNOSIS — I10 Essential (primary) hypertension: Secondary | ICD-10-CM

## 2023-10-04 ENCOUNTER — Ambulatory Visit
Admission: RE | Admit: 2023-10-04 | Discharge: 2023-10-04 | Disposition: A | Discharge status: Home / Self Care | Payer: Medicaid Other | Source: Ambulatory Visit | Attending: Internal Medicine | Admitting: Internal Medicine

## 2023-10-04 DIAGNOSIS — G44201 Tension-type headache, unspecified, intractable: Secondary | ICD-10-CM

## 2023-10-24 ENCOUNTER — Telehealth: Payer: Self-pay | Admitting: Internal Medicine

## 2023-10-24 NOTE — Telephone Encounter (Signed)
Pt called asking about blood pressure medication but I don't see any blood medication on her med list. Pt was trying to get it prescribed Please advise.

## 2023-10-25 NOTE — Telephone Encounter (Signed)
Called patient and informed her of MD message and let her know that she doesn't need medication

## 2023-10-25 NOTE — Telephone Encounter (Signed)
Was blood pressure medication mentioned to patient during visit?

## 2023-10-25 NOTE — Telephone Encounter (Signed)
Blood pressure was normal at recent visit so she does not need medication to lower BP. Can you call clarify with her what she is requesting medication for?

## 2023-11-09 ENCOUNTER — Ambulatory Visit
Admission: RE | Admit: 2023-11-09 | Discharge: 2023-11-09 | Disposition: A | Discharge status: Home / Self Care | Payer: Medicaid Other | Source: Ambulatory Visit | Attending: Internal Medicine | Admitting: Internal Medicine

## 2023-11-09 ENCOUNTER — Other Ambulatory Visit: Payer: Self-pay | Admitting: Internal Medicine

## 2023-11-09 DIAGNOSIS — R519 Headache, unspecified: Secondary | ICD-10-CM

## 2023-12-24 NOTE — Therapy (Signed)
OUTPATIENT PHYSICAL THERAPY THORACOLUMBAR EVALUATION   Patient Name: Tami Martin MRN: [id number] DOB:Apr 25, 1971, 53 y.o., female Today's Date: 12/25/2023  END OF SESSION:  PT End of Session - 12/25/23 1044     Visit Number 1    Number of Visits 1    Date for PT Re-Evaluation --   In approx 3 months   Authorization Type [place] MEDICAID [employer]    PT Start Time 0845    PT Stop Time 0935    PT Time Calculation (min) 50 min    Activity Tolerance Patient tolerated treatment well    Behavior During Therapy [facility] for tasks assessed/performed             Past Medical History:  Diagnosis Date   Allergy    Seasonal allergies    DDD (degenerative disc disease), lumbosacral 2011   GERD (gastroesophageal reflux disease)    denies today 07/2016   Prediabetes 2014   Past Surgical History:  Procedure Laterality Date   KNEE SURGERY Right 2020   Patient Active Problem List   Diagnosis Date Noted   Right lower quadrant abdominal pain 07/06/2022   Right lower quadrant abdominal tenderness with rebound tenderness 07/06/2022   Acute cystitis without hematuria 07/06/2022   Chronic bilateral low back pain with right-sided sciatica 03/09/2022   Hot flashes 09/16/2021   Acute sinusitis 08/15/2018   Pes planus 10/11/2017   Prediabetes 08/29/2016   GERD (gastroesophageal reflux disease) 08/29/2016   Acute intractable headache 01/22/2015   Encounter for general adult medical examination with abnormal findings 09/11/2014   HTN (hypertension) 09/11/2013   Allergic rhinitis 06/10/2013   Sciatica neuralgia 03/04/2013    PCP: Myrlene Broker, MD   REFERRING PROVIDER: Lisbeth Renshaw, MD   REFERRING DIAG: [phone] (ICD-10-CM) - Lumbar spondylosis   Rationale for Evaluation and Treatment: Rehabilitation  THERAPY DIAG:  Chronic right-sided low back pain with right-sided sciatica  Muscle weakness (generalized)  Abnormal posture  ONSET DATE: Chronic,  at least over 2 years  SUBJECTIVE:                                                                                                                                                                                           SUBJECTIVE STATEMENT: Pt reports a chronic Hx of R low back pain and lateral R LE pain that extends past the knee. Endorses infrequent N/T of the L LE. Insidious onset.  PERTINENT HISTORY:  R TKR  PAIN:  Are you having pain? Yes: NPRS scale: 4/10. Pain range the week prior to eval: 4-8/10 Pain location: R low back pain and lateral R LE pain  that extends past the knee Pain description: ache, sharp, constant Aggravating factors: Standing or walking fo aproprax 30 mins, sometimes with prolonged sitting Relieving factors: Pain medication  PRECAUTIONS: None  RED FLAGS: None   WEIGHT BEARING RESTRICTIONS: No  FALLS:  Has patient fallen in last 6 months? No  LIVING ENVIRONMENT: Lives with: lives with their family Lives in: House/apartment Pt has no difficulty with accesing or mobility within home  OCCUPATION: Not working  PLOF: Independent  PATIENT GOALS: less pain  NEXT MD VISIT: NA  OBJECTIVE:  Note: Objective measures were completed at Evaluation unless otherwise noted.  DIAGNOSTIC FINDINGS:  01/04/23 DG Lumbar Spine  FINDINGS: There are 5 non-rib-bearing lumbar vertebra. The alignment is normal. No acute fracture. Vertebral body heights are preserved. Posterior elements are intact. Disc space narrowing and spurring at L4-L5 and L5-S1 without significant interval change. Facet hypertrophy from L3-L4 through L5-S1. The sacroiliac joints are congruent.   IMPRESSION: 1. No evidence of acute fracture or subluxation of the lumbar spine. 2. Degenerative are stable from prior exam.  PATIENT SURVEYS:  FOTO: Perceived function   32%, predicted   46%   COGNITION: Overall cognitive status: Within functional limits for tasks  assessed     SENSATION: WFL  MUSCLE LENGTH: Hamstrings: Right WNLs deg; Left WNLs deg Maisie Fus test: Right WNLs deg; Left WNLs deg  POSTURE: increased lumbar lordosis  PALPATION: TTP R lumbar paraspinals with increased muscle tightness  LUMBAR ROM:   AROM eval  Flexion Full, R LBP  Extension Full, R LBP  Right lateral flexion Min limited, R LBP  Left lateral flexion Full, no pain  Right rotation Full, no pain  Left rotation Full, no pain   (Blank rows = not tested)  LOWER EXTREMITY ROM:     WFLs Passive  Right eval Left eval  Hip flexion    Hip extension    Hip abduction    Hip adduction    Hip internal rotation    Hip external rotation    Knee flexion    Knee extension    Ankle dorsiflexion    Ankle plantarflexion    Ankle inversion    Ankle eversion     (Blank rows = not tested)  LOWER EXTREMITY MMT:    MMT Right eval Left eval  Hip flexion 4 4+  Hip extension    Hip abduction    Hip adduction    Hip internal rotation    Hip external rotation    Knee flexion 5 5  Knee extension 5 5  Ankle dorsiflexion 5 5  Ankle plantarflexion 5 5  Ankle inversion    Ankle eversion     (Blank rows = not tested)  LUMBAR SPECIAL TESTS:  Straight leg raise test: Negative, Slump test: Negative, and SI Compression/distraction test: Negative  FUNCTIONAL TESTS:  NT  GAIT: Distance walked: 200' Assistive device utilized: None Level of assistance: Complete Independence Comments: WNLs  TREATMENT DATE:  [facility] Adult PT Treatment:                                                DATE: 12/25/23 Therapeutic Exercise: Developed, instructed in, and pt completed therex as noted in HEP  PATIENT EDUCATION:  Education details: Eval findings, POC, HEP Person educated: Patient and Child(ren) Education method: Explanation, Demonstration, Tactile cues, Verbal cues,  and Handouts Education comprehension: verbalized understanding, returned demonstration, verbal cues required, and tactile cues required  HOME EXERCISE PROGRAM: Access Code: [email or web address] URL: [email or web address] Date: 12/25/2023 Prepared by: Joellyn Rued  Exercises - Right Standing Lateral Shift Correction at Wall - Hold  - 3 x daily - 7 x weekly - 1 sets - 10 reps - 2 hold - Hooklying Single Knee to Chest  - 2 x daily - 7 x weekly - 1 sets - 3 reps - 20 hold - Supine Lower Trunk Rotation  - 2 x daily - 7 x weekly - 1 sets - 10 reps - 3 hold - Curl Up with Reach  - 2 x daily - 7 x weekly - 1 sets - 10 reps - 3 hold  ASSESSMENT:  CLINICAL IMPRESSION: Patient is a 53 y.o. female who was seen today for physical therapy evaluation and treatment for M47.816 (ICD-10-CM) - Lumbar spondylosis. Pt presents with R low back and LE pain, increased lumbar lordosis, and R hip flexor weakness indicating possible L3 nerve root involvement. An HEP was initiated for lumbopelvic flexibility and core strengthening. The pt is leaving for a 3 month trip out of the country soon and no additional PT appts were able to be scheduled prior to her leaving date. Pt is to schedule a PT appt when she returns home. At that time, PT will complete a re-eval and provided treatment as indicated.  OBJECTIVE IMPAIRMENTS: decreased activity tolerance, difficulty walking, decreased strength, postural dysfunction, and pain.   ACTIVITY LIMITATIONS: carrying, lifting, bending, sitting, standing, squatting, and locomotion level  PARTICIPATION LIMITATIONS: meal prep, cleaning, laundry, shopping, and community activity  PERSONAL FACTORS: Fitness, Past/current experiences, Time since onset of injury/illness/exacerbation, and 1 comorbidity: DDD  are also affecting patient's functional outcome.   REHAB POTENTIAL: Good  CLINICAL DECISION MAKING: Evolving/moderate complexity  EVALUATION COMPLEXITY:  Moderate   GOALS:  SHORT TERM GOALS: Target date: 12/25/23  Pt will demonstrate initial HEP  Baseline: started Goal status: MET  LONG TERM GOALS: Target date: 3 months  Re- Eval and establish LTGs when pt returns from 3 month trip out of the country Baseline:  Goal status: INITIAL  PLAN:  PT FREQUENCY: 2x/week  PT DURATION: 6 weeks as indicated after pt returns from 3 month trip out of country  PLANNED INTERVENTIONS: 97164- PT Re-evaluation, 97110-Therapeutic exercises, 97530- Therapeutic activity, 97535- Self Care, 54098- Manual therapy, [id number]- Aquatic Therapy, 97014- Electrical stimulation (unattended), [id number]- Traction (mechanical), Patient/Family education, Taping, Dry Needling, Joint mobilization, Spinal mobilization, Cryotherapy, and Moist heat.  PLAN FOR NEXT SESSION: Re-Eval; assess response to HEP; progress therex as indicated; use of modalities, manual therapy; and TPDN as indicated.   Talha Iser MS, PT 12/25/23 1:44 PM  For all possible CPT codes, reference the Planned Interventions line above.     Check all conditions that are expected to impact treatment: {Conditions expected to impact treatment:Musculoskeletal disorders   If treatment provided at initial evaluation, no treatment charged due to lack of authorization.

## 2023-12-25 ENCOUNTER — Ambulatory Visit: Payer: Medicaid Other | Attending: Neurosurgery

## 2023-12-25 ENCOUNTER — Other Ambulatory Visit: Payer: Self-pay

## 2023-12-25 DIAGNOSIS — M6281 Muscle weakness (generalized): Secondary | ICD-10-CM | POA: Diagnosis present

## 2023-12-25 DIAGNOSIS — R293 Abnormal posture: Secondary | ICD-10-CM | POA: Diagnosis present

## 2023-12-25 DIAGNOSIS — G8929 Other chronic pain: Secondary | ICD-10-CM | POA: Diagnosis present

## 2023-12-25 DIAGNOSIS — M5441 Lumbago with sciatica, right side: Secondary | ICD-10-CM | POA: Insufficient documentation

## 2023-12-26 ENCOUNTER — Other Ambulatory Visit: Payer: Self-pay | Admitting: Internal Medicine

## 2023-12-26 NOTE — Telephone Encounter (Signed)
Copied from CRM [id number]. Topic: Clinical - Medication Refill >> Dec 26, 2023 11:45 AM Steele Sizer wrote: Most Recent Primary Care Visit:  Provider: Hillard Danker A  Department: [facility]  Visit Type: OFFICE VISIT  Date: 09/20/2023  Medication: meloxicam (MOBIC) 15 MG tablet  cyclobenzaprine (FLEXERIL) 5 MG tablet  Has the patient contacted their pharmacy? Yes (Agent: If no, request that the patient contact the pharmacy for the refill. If patient does not wish to contact the pharmacy document the reason why and proceed with request.) (Agent: If yes, when and what did the pharmacy advise?)  Pt stated that the pharmacy informed her to contact us  Is this the correct pharmacy for this prescription? Yes If no, delete pharmacy and type the correct one.  This is the patient's preferred pharmacy:  [id number] - [place] ([place]), [place] - [address] [place] [address] Phone: [phone] Fax: [phone]  Has the prescription been filled recently? No  Is the patient out of the medication? Yes  Has the patient been seen for an appointment in the last year OR does the patient have an upcoming appointment?   Can we respond through MyChart?   Agent: Please be advised that Rx refills may take up to 3 business days. We ask that you follow-up with your pharmacy.

## 2023-12-26 NOTE — Telephone Encounter (Signed)
Copied from CRM [id number]. Topic: Clinical - Medication Refill >> Dec 26, 2023 11:49 AM Steele Sizer wrote: Most Recent Primary Care Visit:  Provider: Hillard Danker A  Department: [facility]  Visit Type: OFFICE VISIT  Date: 09/20/2023  Medication: ***  Has the patient contacted their pharmacy?  (Agent: If no, request that the patient contact the pharmacy for the refill. If patient does not wish to contact the pharmacy document the reason why and proceed with request.) (Agent: If yes, when and what did the pharmacy advise?)  Is this the correct pharmacy for this prescription?  If no, delete pharmacy and type the correct one.  This is the patient's preferred pharmacy:  [id number] - [place] ([place]), [place] - [address] [place] ([place] [address] Phone: [phone] Fax: [phone]  [facility] - [place], [place] - [address] [place] [address] Phone: [phone] Fax: [phone]  [id number] - [place], [place] - [address] AT [facility] [address] [place] [address] Phone: [phone] Fax: [phone]  [facility] [place] - [place] - [address] [place] [address] Phone: [phone] Fax: [phone]   Has the prescription been filled recently?   Is the patient out of the medication?   Has the patient been seen for an appointment in the last year OR does the patient have an upcoming appointment?   Can we respond through MyChart?   Agent: Please be advised that Rx refills may take up to 3 business days. We ask that you follow-up with your pharmacy.

## 2023-12-27 MED ORDER — PANTOPRAZOLE SODIUM 40 MG PO TBEC: 40.0000 mg | DELAYED_RELEASE_TABLET | Freq: Every day | ORAL | 0 refills | Status: DC

## 2023-12-27 MED ORDER — CYCLOBENZAPRINE HCL 5 MG PO TABS: 5.0000 mg | ORAL_TABLET | Freq: Three times a day (TID) | ORAL | 1 refills | Status: DC | PRN

## 2023-12-27 MED ORDER — MELOXICAM 15 MG PO TABS: 15.0000 mg | ORAL_TABLET | Freq: Every day | ORAL | 6 refills | Status: DC

## 2023-12-31 ENCOUNTER — Other Ambulatory Visit: Payer: Self-pay | Admitting: Internal Medicine

## 2023-12-31 DIAGNOSIS — I10 Essential (primary) hypertension: Secondary | ICD-10-CM

## 2024-01-05 ENCOUNTER — Other Ambulatory Visit: Payer: Self-pay | Admitting: Internal Medicine

## 2024-01-05 DIAGNOSIS — I10 Essential (primary) hypertension: Secondary | ICD-10-CM

## 2024-04-08 ENCOUNTER — Other Ambulatory Visit: Payer: Self-pay | Admitting: Internal Medicine

## 2024-04-08 DIAGNOSIS — I10 Essential (primary) hypertension: Secondary | ICD-10-CM

## 2024-04-13 NOTE — Progress Notes (Unsigned)
   Acute Office Visit  Subjective:     Patient ID: Tami Martin, female    DOB: [birth year], 53 y.o.   MRN: [id number]  No chief complaint on file.   HPI Patient is in today for evaluation of ***, for the last ***. Has tried Denies known sick contacts, Denies abdominal pain, nausea, vomiting, diarrhea, rash, fever, chills, other symptoms.  Medical hx as outlined below.  ROS Per HPI      Objective:    There were no vitals taken for this visit.   Physical Exam Vitals and nursing note reviewed.  Constitutional:      General: She is not in acute distress.    Comments: Appears fatigued  HENT:     Head: Normocephalic and atraumatic.     Right Ear: External ear normal.     Left Ear: External ear normal.     Nose: No congestion.     Mouth/Throat:     Mouth: Mucous membranes are moist.     Pharynx: Oropharynx is clear. No oropharyngeal exudate or posterior oropharyngeal erythema.  Eyes:     Extraocular Movements: Extraocular movements intact.  Cardiovascular:     Rate and Rhythm: Normal rate and regular rhythm.     Heart sounds: Normal heart sounds.  Pulmonary:     Effort: Pulmonary effort is normal. No respiratory distress.     Breath sounds: No wheezing, rhonchi or rales.  Musculoskeletal:     Cervical back: Normal range of motion and neck supple.  Lymphadenopathy:     Cervical: No cervical adenopathy.  Skin:    General: Skin is warm and dry.  Neurological:     General: No focal deficit present.     Mental Status: She is alert and oriented to person, place, and time.   No results found for any visits on 04/14/24.      Assessment & Plan:   There are no diagnoses linked to this encounter.   No orders of the defined types were placed in this encounter.   No follow-ups on file.  Wellington Half, FNP

## 2024-04-14 ENCOUNTER — Emergency Department (HOSPITAL_COMMUNITY)

## 2024-04-14 ENCOUNTER — Other Ambulatory Visit: Payer: Self-pay

## 2024-04-14 ENCOUNTER — Encounter: Payer: Self-pay | Admitting: Family Medicine

## 2024-04-14 ENCOUNTER — Encounter (HOSPITAL_COMMUNITY): Payer: Self-pay | Admitting: Internal Medicine

## 2024-04-14 ENCOUNTER — Inpatient Hospital Stay (HOSPITAL_COMMUNITY)
Admission: EM | Admit: 2024-04-14 | Discharge: 2024-04-19 | DRG: 872 | Disposition: A | Discharge status: Home / Self Care | Source: Ambulatory Visit | Attending: Family Medicine | Admitting: Family Medicine

## 2024-04-14 ENCOUNTER — Ambulatory Visit (INDEPENDENT_AMBULATORY_CARE_PROVIDER_SITE_OTHER): Admitting: Family Medicine

## 2024-04-14 VITALS — BP 102/70 | HR 114 | Temp 98.6°F | Ht 66.0 in | Wt 187.2 lb

## 2024-04-14 DIAGNOSIS — E876 Hypokalemia: Secondary | ICD-10-CM | POA: Diagnosis present

## 2024-04-14 DIAGNOSIS — A499 Bacterial infection, unspecified: Secondary | ICD-10-CM | POA: Diagnosis not present

## 2024-04-14 DIAGNOSIS — K219 Gastro-esophageal reflux disease without esophagitis: Secondary | ICD-10-CM | POA: Diagnosis present

## 2024-04-14 DIAGNOSIS — I10 Essential (primary) hypertension: Secondary | ICD-10-CM | POA: Diagnosis present

## 2024-04-14 DIAGNOSIS — B54 Unspecified malaria: Secondary | ICD-10-CM | POA: Diagnosis present

## 2024-04-14 DIAGNOSIS — Z1152 Encounter for screening for COVID-19: Secondary | ICD-10-CM

## 2024-04-14 DIAGNOSIS — K529 Noninfective gastroenteritis and colitis, unspecified: Secondary | ICD-10-CM | POA: Insufficient documentation

## 2024-04-14 DIAGNOSIS — R051 Acute cough: Secondary | ICD-10-CM

## 2024-04-14 DIAGNOSIS — D696 Thrombocytopenia, unspecified: Secondary | ICD-10-CM | POA: Diagnosis not present

## 2024-04-14 DIAGNOSIS — I959 Hypotension, unspecified: Secondary | ICD-10-CM | POA: Diagnosis present

## 2024-04-14 DIAGNOSIS — A419 Sepsis, unspecified organism: Principal | ICD-10-CM | POA: Diagnosis present

## 2024-04-14 DIAGNOSIS — D649 Anemia, unspecified: Secondary | ICD-10-CM | POA: Diagnosis present

## 2024-04-14 DIAGNOSIS — Z791 Long term (current) use of non-steroidal anti-inflammatories (NSAID): Secondary | ICD-10-CM

## 2024-04-14 DIAGNOSIS — E871 Hypo-osmolality and hyponatremia: Secondary | ICD-10-CM | POA: Diagnosis present

## 2024-04-14 DIAGNOSIS — E872 Acidosis, unspecified: Secondary | ICD-10-CM | POA: Diagnosis present

## 2024-04-14 DIAGNOSIS — Z8669 Personal history of other diseases of the nervous system and sense organs: Secondary | ICD-10-CM | POA: Diagnosis not present

## 2024-04-14 DIAGNOSIS — R531 Weakness: Secondary | ICD-10-CM | POA: Diagnosis not present

## 2024-04-14 DIAGNOSIS — E861 Hypovolemia: Secondary | ICD-10-CM | POA: Diagnosis not present

## 2024-04-14 DIAGNOSIS — M5441 Lumbago with sciatica, right side: Secondary | ICD-10-CM | POA: Diagnosis present

## 2024-04-14 DIAGNOSIS — Z8249 Family history of ischemic heart disease and other diseases of the circulatory system: Secondary | ICD-10-CM | POA: Insufficient documentation

## 2024-04-14 DIAGNOSIS — R42 Dizziness and giddiness: Secondary | ICD-10-CM | POA: Insufficient documentation

## 2024-04-14 DIAGNOSIS — R7303 Prediabetes: Secondary | ICD-10-CM | POA: Diagnosis present

## 2024-04-14 DIAGNOSIS — D6959 Other secondary thrombocytopenia: Secondary | ICD-10-CM | POA: Diagnosis present

## 2024-04-14 DIAGNOSIS — Z79899 Other long term (current) drug therapy: Secondary | ICD-10-CM | POA: Diagnosis not present

## 2024-04-14 DIAGNOSIS — M47816 Spondylosis without myelopathy or radiculopathy, lumbar region: Secondary | ICD-10-CM | POA: Insufficient documentation

## 2024-04-14 DIAGNOSIS — R6520 Severe sepsis without septic shock: Secondary | ICD-10-CM | POA: Diagnosis present

## 2024-04-14 DIAGNOSIS — R000 Tachycardia, unspecified: Secondary | ICD-10-CM | POA: Insufficient documentation

## 2024-04-14 LAB — CBC WITH DIFFERENTIAL/PLATELET
Abs Immature Granulocytes: 0.11 10*3/uL — ABNORMAL HIGH (ref 0.00–0.07)
Basophils Absolute: 0.1 10*3/uL (ref 0.0–0.1)
Basophils Relative: 1 %
Eosinophils Absolute: 0 10*3/uL (ref 0.0–0.5)
Eosinophils Relative: 0 %
HCT: 44.1 % (ref 36.0–46.0)
Hemoglobin: 14.7 g/dL (ref 12.0–15.0)
Immature Granulocytes: 2 %
Lymphocytes Relative: 25 %
Lymphs Abs: 1.5 10*3/uL (ref 0.7–4.0)
MCH: 28 pg (ref 26.0–34.0)
MCHC: 33.3 g/dL (ref 30.0–36.0)
MCV: 84 fL (ref 80.0–100.0)
Monocytes Absolute: 0.5 10*3/uL (ref 0.1–1.0)
Monocytes Relative: 9 %
Neutro Abs: 3.8 10*3/uL (ref 1.7–7.7)
Neutrophils Relative %: 63 %
Platelets: 66 10*3/uL — ABNORMAL LOW (ref 150–400)
RBC: 5.25 MIL/uL — ABNORMAL HIGH (ref 3.87–5.11)
RDW: 15.9 % — ABNORMAL HIGH (ref 11.5–15.5)
WBC: 6.1 10*3/uL (ref 4.0–10.5)
nRBC: 0 % (ref 0.0–0.2)

## 2024-04-14 LAB — I-STAT CHEM 8, ED
BUN: 11 mg/dL (ref 6–20)
Calcium, Ion: 0.89 mmol/L — CL (ref 1.15–1.40)
Chloride: 106 mmol/L (ref 98–111)
Creatinine, Ser: 0.5 mg/dL (ref 0.44–1.00)
Glucose, Bld: 83 mg/dL (ref 70–99)
HCT: 37 % (ref 36.0–46.0)
Hemoglobin: 12.6 g/dL (ref 12.0–15.0)
Potassium: 3.3 mmol/L — ABNORMAL LOW (ref 3.5–5.1)
Sodium: 137 mmol/L (ref 135–145)
TCO2: 19 mmol/L — ABNORMAL LOW (ref 22–32)

## 2024-04-14 LAB — COMPREHENSIVE METABOLIC PANEL WITH GFR
ALT: 17 U/L (ref 0–44)
AST: 29 U/L (ref 15–41)
Albumin: 2.2 g/dL — ABNORMAL LOW (ref 3.5–5.0)
Alkaline Phosphatase: 45 U/L (ref 38–126)
Anion gap: 9 (ref 5–15)
BUN: 13 mg/dL (ref 6–20)
CO2: 18 mmol/L — ABNORMAL LOW (ref 22–32)
Calcium: 7 mg/dL — ABNORMAL LOW (ref 8.9–10.3)
Chloride: 106 mmol/L (ref 98–111)
Creatinine, Ser: 0.54 mg/dL (ref 0.44–1.00)
GFR, Estimated: >60 mL/min (ref >60)
Glucose, Bld: 91 mg/dL (ref 70–99)
Potassium: 3.7 mmol/L (ref 3.5–5.1)
Sodium: 133 mmol/L — ABNORMAL LOW (ref 135–145)
Total Bilirubin: 1.4 mg/dL — ABNORMAL HIGH (ref 0.0–1.2)
Total Protein: 5.4 g/dL — ABNORMAL LOW (ref 6.5–8.1)

## 2024-04-14 LAB — URINALYSIS, ROUTINE W REFLEX MICROSCOPIC
Bilirubin Urine: NEGATIVE
Glucose, UA: NEGATIVE mg/dL
Hgb urine dipstick: NEGATIVE
Ketones, ur: 5 mg/dL — AB
Leukocytes,Ua: NEGATIVE
Nitrite: NEGATIVE
Protein, ur: NEGATIVE mg/dL
Specific Gravity, Urine: 1.003 — ABNORMAL LOW (ref 1.005–1.030)
pH: 6 (ref 5.0–8.0)

## 2024-04-14 LAB — LACTIC ACID, PLASMA: Lactic Acid, Venous: 2.1 mmol/L (ref 0.5–1.9)

## 2024-04-14 LAB — RESP PANEL BY RT-PCR (RSV, FLU A&B, COVID)  RVPGX2
Influenza A by PCR: NEGATIVE
Influenza B by PCR: NEGATIVE
Resp Syncytial Virus by PCR: NEGATIVE
SARS Coronavirus 2 by RT PCR: NEGATIVE

## 2024-04-14 LAB — HCG, SERUM, QUALITATIVE: Preg, Serum: NEGATIVE

## 2024-04-14 LAB — I-STAT CG4 LACTIC ACID, ED
Lactic Acid, Venous: 3.3 mmol/L (ref 0.5–1.9)
Lactic Acid, Venous: 1.4 mmol/L (ref 0.5–1.9)

## 2024-04-14 LAB — CBG MONITORING, ED: Glucose-Capillary: 111 mg/dL — ABNORMAL HIGH (ref 70–99)

## 2024-04-14 LAB — PROTIME-INR
INR: 1.1 (ref 0.8–1.2)
Prothrombin Time: 14.1 s (ref 11.4–15.2)

## 2024-04-14 LAB — LIPASE, BLOOD: Lipase: 19 U/L (ref 11–51)

## 2024-04-14 MED ORDER — MIDODRINE HCL 5 MG PO TABS: 10.0000 mg | ORAL_TABLET | ORAL | Status: DC

## 2024-04-14 MED ORDER — VANCOMYCIN HCL 1750 MG/350ML IV SOLN
1750.0000 mg | INTRAVENOUS | Status: DC
  Administered 2024-04-16: 1750 mg via INTRAVENOUS
  Filled 2024-04-14: qty 350

## 2024-04-14 MED ORDER — SODIUM CHLORIDE 0.9 % IV BOLUS
2000.0000 mL | Freq: Once | INTRAVENOUS | Status: AC
  Administered 2024-04-14: 2000 mL via INTRAVENOUS

## 2024-04-14 MED ORDER — ONDANSETRON HCL 4 MG/2ML IJ SOLN
4.0000 mg | Freq: Four times a day (QID) | INTRAMUSCULAR | Status: DC | PRN
  Administered 2024-04-16: 4 mg via INTRAVENOUS
  Filled 2024-04-14: qty 2

## 2024-04-14 MED ORDER — IOHEXOL 300 MG/ML  SOLN
100.0000 mL | Freq: Once | INTRAMUSCULAR | Status: AC | PRN
  Administered 2024-04-14: 100 mL via INTRAVENOUS

## 2024-04-14 MED ORDER — SODIUM CHLORIDE 0.9% FLUSH
3.0000 mL | Freq: Two times a day (BID) | INTRAVENOUS | Status: DC
  Administered 2024-04-14 – 2024-04-19 (×6): 3 mL via INTRAVENOUS

## 2024-04-14 MED ORDER — VANCOMYCIN HCL 1750 MG/350ML IV SOLN
1750.0000 mg | Freq: Once | INTRAVENOUS | Status: AC
  Administered 2024-04-14: 1750 mg via INTRAVENOUS
  Filled 2024-04-14: qty 350

## 2024-04-14 MED ORDER — SODIUM CHLORIDE 0.9% FLUSH: 3.0000 mL | INTRAVENOUS | Status: DC | PRN

## 2024-04-14 MED ORDER — LIDOCAINE 5 % EX PTCH
1.0000 | MEDICATED_PATCH | CUTANEOUS | Status: DC
  Administered 2024-04-14 – 2024-04-18 (×5): 1 via TRANSDERMAL
  Filled 2024-04-14 (×5): qty 1

## 2024-04-14 MED ORDER — LACTATED RINGERS IV BOLUS
1000.0000 mL | Freq: Once | INTRAVENOUS | Status: AC
  Administered 2024-04-14: 1000 mL via INTRAVENOUS

## 2024-04-14 MED ORDER — ONDANSETRON HCL 4 MG PO TABS
4.0000 mg | ORAL_TABLET | Freq: Four times a day (QID) | ORAL | Status: DC | PRN
Start: 2024-04-14 — End: 2024-04-19

## 2024-04-14 MED ORDER — IBUPROFEN 200 MG PO TABS
600.0000 mg | ORAL_TABLET | Freq: Once | ORAL | Status: AC
  Administered 2024-04-14: 600 mg via ORAL
  Filled 2024-04-14: qty 3

## 2024-04-14 MED ORDER — ONDANSETRON 8 MG PO TBDP
8.0000 mg | ORAL_TABLET | Freq: Once | ORAL | Status: AC
  Administered 2024-04-14: 8 mg via ORAL
  Filled 2024-04-14: qty 1

## 2024-04-14 MED ORDER — SODIUM CHLORIDE 0.9% FLUSH
3.0000 mL | Freq: Two times a day (BID) | INTRAVENOUS | Status: DC
  Administered 2024-04-14 – 2024-04-18 (×6): 3 mL via INTRAVENOUS

## 2024-04-14 MED ORDER — METRONIDAZOLE 500 MG/100ML IV SOLN
500.0000 mg | Freq: Two times a day (BID) | INTRAVENOUS | Status: DC
  Administered 2024-04-14 – 2024-04-16 (×5): 500 mg via INTRAVENOUS
  Filled 2024-04-14 (×5): qty 100

## 2024-04-14 MED ORDER — ACETAMINOPHEN 650 MG RE SUPP
650.0000 mg | Freq: Four times a day (QID) | RECTAL | Status: DC | PRN
  Filled 2024-04-14: qty 1

## 2024-04-14 MED ORDER — SODIUM CHLORIDE 0.9 % IV SOLN: 250.0000 mL | INTRAVENOUS | Status: AC | PRN

## 2024-04-14 MED ORDER — METRONIDAZOLE 500 MG/100ML IV SOLN: 500.0000 mg | Freq: Two times a day (BID) | INTRAVENOUS | Status: DC

## 2024-04-14 MED ORDER — ACETAMINOPHEN 325 MG PO TABS
650.0000 mg | ORAL_TABLET | Freq: Four times a day (QID) | ORAL | Status: DC | PRN
  Administered 2024-04-15 – 2024-04-17 (×8): 650 mg via ORAL
  Filled 2024-04-14 (×8): qty 2

## 2024-04-14 MED ORDER — MIDODRINE HCL 5 MG PO TABS
10.0000 mg | ORAL_TABLET | Freq: Three times a day (TID) | ORAL | Status: DC
  Administered 2024-04-14 – 2024-04-15 (×2): 10 mg via ORAL
  Filled 2024-04-14 (×2): qty 2

## 2024-04-14 MED ORDER — CIPROFLOXACIN IN D5W 400 MG/200ML IV SOLN
400.0000 mg | Freq: Once | INTRAVENOUS | Status: AC
  Administered 2024-04-14: 400 mg via INTRAVENOUS
  Filled 2024-04-14: qty 200

## 2024-04-14 MED ORDER — LACTATED RINGERS IV BOLUS
1000.0000 mL | INTRAVENOUS | Status: AC
  Administered 2024-04-14: 1000 mL via INTRAVENOUS

## 2024-04-14 MED ORDER — SODIUM CHLORIDE 0.9 % IV SOLN
2.0000 g | Freq: Three times a day (TID) | INTRAVENOUS | Status: DC
  Administered 2024-04-15 – 2024-04-17 (×7): 2 g via INTRAVENOUS
  Filled 2024-04-14 (×7): qty 12.5

## 2024-04-14 MED ORDER — SODIUM CHLORIDE 0.9 % IV SOLN
2.0000 g | Freq: Once | INTRAVENOUS | Status: AC
  Administered 2024-04-14: 2 g via INTRAVENOUS
  Filled 2024-04-14: qty 20

## 2024-04-14 MED ORDER — ALBUMIN HUMAN 25 % IV SOLN
25.0000 g | INTRAVENOUS | Status: AC
  Administered 2024-04-14: 25 g via INTRAVENOUS
  Filled 2024-04-14: qty 100

## 2024-04-14 MED ORDER — LACTATED RINGERS IV SOLN: INTRAVENOUS | Status: AC

## 2024-04-14 MED ORDER — ACETAMINOPHEN 325 MG PO TABS
650.0000 mg | ORAL_TABLET | Freq: Once | ORAL | Status: AC
  Administered 2024-04-14: 650 mg via ORAL
  Filled 2024-04-14: qty 2

## 2024-04-14 NOTE — ED Provider Notes (Signed)
 Care transferred to me.  Patient has been febrile with diarrhea.  She recently came back from [place].  Having soft blood pressures on my evaluation, most recently 85 systolic with a MAP of 65.  I think she will need continued fluids and supportive care.  Given the recent travel we will put her on Cipro.  She will need admission to the hospitalist for observation and fluid resuscitation.  Lactate has improved fortunately.  Dr. Sundil will admit.   Tami Montenegro, MD 04/14/24 2126

## 2024-04-14 NOTE — Patient Instructions (Signed)
 Go directly to the ER for further evaluation and treatment.   I am concerned that you need IV hydration support.   They will also be able to draw labs and get them back quickly to help rule out acute kidney injury given length of symptoms.   Follow-up with me for new or worsening symptoms.

## 2024-04-14 NOTE — ED Triage Notes (Signed)
 Arrived by EMS. Patient picked up from doctor's office. Patient reports fall on Friday, diarrhea, nausea, vomiting, headache 10/10, mouth injury from fall. Patoient A&O X4. Oral temp 102.3 CBG 140

## 2024-04-14 NOTE — ED Notes (Signed)
 Pt ambulatory to restroom, but states it was "an emergency" and wasn't able to collect urine specimen at this time.

## 2024-04-14 NOTE — ED Provider Notes (Signed)
 [facility] EMERGENCY DEPARTMENT AT [facility] Provider Note   CSN: [id number] Arrival date & time: 04/14/24  1128     History  Chief Complaint  Patient presents with   Fall   Dizziness   Diarrhea    Tami Martin is a 53 y.o. female.  Patient complains of abdominal pain diarrhea and fever.  Patient has a history of GERD.  She has also had some vomiting.  The history is provided by the patient and medical records. No language interpreter was used.  Diarrhea Quality:  Mucous Severity:  Severe Onset quality:  Sudden Timing:  Constant Progression:  Unchanged Relieved by:  Nothing Worsened by:  Nothing Ineffective treatments:  None tried Associated symptoms: abdominal pain   Associated symptoms: no chills and no headaches        Home Medications Prior to Admission medications   Medication Sig Start Date End Date Taking? Authorizing Provider  cyclobenzaprine  (FLEXERIL ) 5 MG tablet Take 1 tablet (5 mg total) by mouth 3 (three) times daily as needed for muscle spasms. 12/27/23   Adelia Homestead, MD  meloxicam  (MOBIC ) 15 MG tablet Take 1 tablet (15 mg total) by mouth daily. 12/27/23   Adelia Homestead, MD  pantoprazole  (PROTONIX ) 40 MG tablet Take 1 tablet by mouth once daily 04/09/24   Adelia Homestead, MD      Allergies    Patient has no known allergies.    Review of Systems   Review of Systems  Constitutional:  Negative for appetite change, chills and fatigue.  HENT:  Negative for congestion, ear discharge and sinus pressure.   Eyes:  Negative for discharge.  Respiratory:  Negative for cough.   Cardiovascular:  Negative for chest pain.  Gastrointestinal:  Positive for abdominal pain and diarrhea.  Genitourinary:  Negative for frequency and hematuria.  Musculoskeletal:  Negative for back pain.  Skin:  Negative for rash.  Neurological:  Negative for seizures and headaches.  Psychiatric/Behavioral:  Negative for hallucinations.      Physical Exam Updated Vital Signs BP 123/69   Pulse 91   Temp (!) 101.4 F (38.6 C) (Oral)   Resp 20   Ht 5\' 5"  (1.651 m)   Wt 81.6 kg   SpO2 99%   BMI 29.95 kg/m  Physical Exam Vitals and nursing note reviewed.  Constitutional:      Appearance: She is well-developed.  HENT:     Head: Normocephalic.     Nose: Nose normal.     Mouth/Throat:     Mouth: Mucous membranes are moist.  Eyes:     General: No scleral icterus.    Conjunctiva/sclera: Conjunctivae normal.  Neck:     Thyroid : No thyromegaly.  Cardiovascular:     Rate and Rhythm: Normal rate and regular rhythm.     Heart sounds: No murmur heard.    No friction rub. No gallop.  Pulmonary:     Breath sounds: No stridor. No wheezing or rales.  Chest:     Chest wall: No tenderness.  Abdominal:     General: There is no distension.     Tenderness: There is abdominal tenderness. There is no rebound.  Musculoskeletal:        General: Normal range of motion.     Cervical back: Neck supple.  Lymphadenopathy:     Cervical: No cervical adenopathy.  Skin:    Findings: No erythema or rash.  Neurological:     Mental Status: She is alert and oriented  to person, place, and time.     Motor: No abnormal muscle tone.     Coordination: Coordination normal.  Psychiatric:        Behavior: Behavior normal.     ED Results / Procedures / Treatments   Labs (all labs ordered are listed, but only abnormal results are displayed) Labs Reviewed  CBC WITH DIFFERENTIAL/PLATELET - Abnormal; Notable for the following components:      Result Value   RBC 5.25 (*)    RDW 15.9 (*)    Platelets 66 (*)    Abs Immature Granulocytes 0.11 (*)    All other components within normal limits  CBG MONITORING, ED - Abnormal; Notable for the following components:   Glucose-Capillary 111 (*)    All other components within normal limits  I-STAT CG4 LACTIC ACID, ED - Abnormal; Notable for the following components:   Lactic Acid, Venous 3.3 (*)     All other components within normal limits  CULTURE, BLOOD (ROUTINE X 2)  RESP PANEL BY RT-PCR (RSV, FLU A&B, COVID)  [id number]  CULTURE, BLOOD (ROUTINE X 2)  GASTROINTESTINAL PANEL BY PCR, STOOL (REPLACES STOOL CULTURE)  C DIFFICILE QUICK SCREEN W PCR REFLEX    PROTIME-INR  HCG, SERUM, QUALITATIVE  URINALYSIS, ROUTINE W REFLEX MICROSCOPIC  COMPREHENSIVE METABOLIC PANEL WITH GFR  LIPASE, BLOOD  COMPREHENSIVE METABOLIC PANEL WITH GFR  LIPASE, BLOOD  I-STAT CG4 LACTIC ACID, ED  I-STAT CG4 LACTIC ACID, ED  I-STAT CHEM 8, ED    EKG None  Radiology DG Chest Port 1 View Result Date: 04/14/2024 CLINICAL DATA:  Sepsis EXAM: PORTABLE CHEST 1 VIEW COMPARISON:  March 08, 2022 FINDINGS: The heart size and mediastinal contours are within normal limits. Both lungs are clear. The visualized skeletal structures are unremarkable. IMPRESSION: No active disease. Electronically Signed   By: Fredrich Jefferson M.D.   On: 04/14/2024 15:07    Procedures Procedures    Medications Ordered in ED Medications  ondansetron (ZOFRAN-ODT) disintegrating tablet 8 mg (8 mg Oral Given 04/14/24 1317)  acetaminophen (TYLENOL) tablet 650 mg (650 mg Oral Given 04/14/24 1342)  sodium chloride  0.9 % bolus 2,000 mL (0 mLs Intravenous Stopped 04/14/24 1601)  cefTRIAXone (ROCEPHIN) 2 g in sodium chloride  0.9 % 100 mL IVPB (0 g Intravenous Stopped 04/14/24 1414)    ED Course/ Medical Decision Making/ A&P   CRITICAL CARE Performed by: Cheyenne Cotta Total critical care time: 44 minutes Critical care time was exclusive of separately billable procedures and treating other patients. Critical care was necessary to treat or prevent imminent or life-threatening deterioration. Critical care was time spent personally by me on the following activities: development of treatment plan with patient and/or surrogate as well as nursing, discussions with consultants, evaluation of patient's response to treatment, examination of patient,  obtaining history from patient or surrogate, ordering and performing treatments and interventions, ordering and review of laboratory studies, ordering and review of radiographic studies, pulse oximetry and re-evaluation of patient's condition.   Sepsis protocol was started for febrile illness with abdominal pain and diarrhea.  Initial lactic was elevated at 3.1 but the second lactic with normal. Click here for ABCD2, HEART and other calculatorsREFRESH Note before signing :1}                              Medical Decision Making Amount and/or Complexity of Data Reviewed Labs: ordered. Radiology: ordered.  Risk OTC drugs.   Patient with diarrhea  or abdominal pain.  CT abdomen pending.  Dr. Lyle San will disposition the patient        Final Clinical Impression(s) / ED Diagnoses Final diagnoses:  None    Rx / DC Orders ED Discharge Orders     None         Cheyenne Cotta, MD 04/14/24 1736

## 2024-04-14 NOTE — Progress Notes (Signed)
 Pharmacy Antibiotic Note  Tami Martin is a 53 y.o. female admitted on 04/14/2024 with sepsis.  Pharmacy has been consulted for Cefepime & Vancomycin dosing.  Plan: Cefepime 2gm IV q8h Vancomycin 1750mg  IV q24h to target AUC 400-550.   Estimated AUC on this regimen = 457 Monitor renal function and cx data  Check levels as needed   Height: 5\' 5"  (165.1 cm) Weight: 81.6 kg (180 lb) IBW/kg (Calculated) : 57  Temp (24hrs), Avg:100.4 F (38 C), Min:98.4 F (36.9 C), Max:102.3 F (39.1 C)  Recent Labs  Lab 04/14/24 1321 04/14/24 1325 04/14/24 1618 04/14/24 1751 04/14/24 1753  WBC  --  6.1  --   --   --   CREATININE  --   --   --  0.50 0.54  LATICACIDVEN 3.3*  --  1.4  --   --     Estimated Creatinine Clearance: 85.8 mL/min (by C-G formula based on SCr of 0.54 mg/dL).    No Known Allergies  Antimicrobials this admission: 5/13 Cefepime >>  5/12 Vancomycin >>  5/12 Cipro x1 5/12 Rocephin x1  Dose adjustments this admission:  Microbiology results: 5/12 BCx:  5/12 Resp PCR: negative GI PCR  Thank you for allowing pharmacy to be a part of this patient's care.  Arie Kurtz PharmD 04/14/2024 10:02 PM  Addendum: Micro reported single blood cx vial drawn is growing GPR.  No BCID run on GPR, possible contaminant.  Continue current antibiotics and cont to monitor cx data.  Laurence Pons, NP made aware. Arie Kurtz, PharmD, BCPS 04/15/2024@4 :59 AM

## 2024-04-14 NOTE — ED Notes (Signed)
 Only able to obtain one blood culture bottle at this time

## 2024-04-14 NOTE — Sepsis Progress Note (Signed)
 Sepsis protocol is being followed by [facility].

## 2024-04-14 NOTE — H&P (Addendum)
 History and Physical    Tami Martin:[id number] DOB: 1971/11/09 DOA: 04/14/2024  PCP: Adelia Homestead, MD   Patient coming from: Home   Chief Complaint:  Chief Complaint  Patient presents with   Fall   Dizziness   Diarrhea   ED TRIAGE note:  Arrived by EMS. Patient picked up from doctor's office. Patient reports fall on Friday, diarrhea, nausea, vomiting, headache 10/10, mouth injury from fall. Patoient A&O X4. Oral temp 102.3 CBG 140      HPI:  Tami Martin is a 53 y.o. 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 Patient reported she has been recently traveled from [place] stated there for 2 months and came to [place]  on 04/04/2024. Patient has sudden onset of nausea, vomiting diarrhea started on Saturday for last 48 hours.  Also has associated fever and generalized abdominal discomfort.  Denies any hematemesis, melena, and bright red per rectum.  Patient denies any chest pain, palp shortness of breath.  No other family member is sick around her. Patient is complaining about right lower back pain due to underlying sciatica. Since patient is in the ED there is no recurrent episode of nausea, vomiting and diarrhea.   ED Course:  At presentation to ED patient found to have elevated temperature 102 F, hypotensive 88/51, tachypneic 18 and O2 sat 100% room air. Normal lipase level. CMP showing low sodium 133, low bicarb 18, normal creatinine, low albumin 2.2. CBC unremarkable stable H&H however low platelet count 66 UA showing decreasing specific gravity otherwise unremarkable. Initial acid 3.3 which has been proved to 1.4. Pending GI and C. difficile panel.   Pending blood culture result.  Pregnancy test negative.  CT abdomen pelvis: No acute finding.  Details are below. 1. No acute findings or explanation for the patient's symptoms. 2. Small collapsing left ovarian follicle with small amount of free pelvic fluid, within physiologic limits. 3. Small subpleural nodules in the right middle lobe, not previously imaged, but likely benign. Per Fleischner Society guidelines, no routine follow-up imaging is recommended.  Chest x-ray unremarkable.  In the ED patient has been treated with ceftriaxone 2 g, ciprofloxacin, 2 L of NS bolus, 1 L of LR bolus, ibuprofen.  Hospitalist has been consulted for further evaluation management of sepsis in the setting of gastroenteritis and thrombocytopenia.  Significant labs in the ED: Lab Orders         Culture, blood (routine x 2)         Resp panel by RT-PCR (RSV, Flu A&B, Covid) Anterior Nasal Swab         Gastrointestinal Panel by PCR , Stool         C Difficile Quick Screen w PCR reflex         CBC with Differential         Urinalysis, Routine w reflex microscopic -Urine, Clean Catch         Protime-INR         hCG, serum, qualitative         Comprehensive metabolic panel with GFR         Lipase, blood         HIV Antibody (routine testing w rflx)         CBC  Comprehensive metabolic panel         Protime-INR         Lactic acid, plasma         CBG monitoring, ED         I-Stat Lactic Acid         I-stat chem 8, ED       Review of Systems:  Review of Systems  Constitutional:  Positive for fever and malaise/fatigue.  Respiratory:  Negative for cough and sputum production.   Cardiovascular:  Negative for chest pain and palpitations.  Gastrointestinal:  Positive for abdominal pain, diarrhea, nausea and vomiting. Negative for blood in stool, constipation, heartburn and melena.  Genitourinary:  Negative for dysuria, frequency and urgency.  Musculoskeletal:  Negative for back pain, falls,  joint pain, myalgias and neck pain.  Neurological:  Negative for dizziness and headaches.  Psychiatric/Behavioral:  The patient is not nervous/anxious.     Past Medical History:  Diagnosis Date   Allergy    Seasonal allergies    DDD (degenerative disc disease), lumbosacral 2011   GERD (gastroesophageal reflux disease)    denies today 07/2016   Prediabetes 2014    Past Surgical History:  Procedure Laterality Date   KNEE SURGERY Right 2020     reports that she has never smoked. She has never used smokeless tobacco. She reports current alcohol use. She reports that she does not use drugs.  No Known Allergies  Family History  Problem Relation Age of Onset   Breast cancer Neg Hx     Prior to Admission medications   Medication Sig Start Date End Date Taking? Authorizing Provider  cyclobenzaprine  (FLEXERIL ) 5 MG tablet Take 1 tablet (5 mg total) by mouth 3 (three) times daily as needed for muscle spasms. 12/27/23   Adelia Homestead, MD  meloxicam  (MOBIC ) 15 MG tablet Take 1 tablet (15 mg total) by mouth daily. 12/27/23   Adelia Homestead, MD  pantoprazole  (PROTONIX ) 40 MG tablet Take 1 tablet by mouth once daily 04/09/24   Adelia Homestead, MD     Physical Exam: Vitals:   04/14/24 2000 04/14/24 2030 04/14/24 2100 04/14/24 2152  BP: (!) 98/54 (!) 85/52 (!) 88/51   Pulse: 89 90 95   Resp: 19 (!) 23 (!) 24   Temp:    98.4 F (36.9 C)  TempSrc:    Oral  SpO2: 96% 96% 96%   Weight:      Height:        Physical Exam Vitals and nursing note reviewed.  Constitutional:      General: She is not in acute distress.    Appearance: She is ill-appearing. She is not toxic-appearing.  HENT:     Mouth/Throat:     Mouth: Mucous membranes are dry.  Cardiovascular:     Rate and Rhythm: Regular rhythm. Tachycardia present.     Pulses: Normal pulses.     Heart sounds: Normal heart sounds.  Pulmonary:     Effort: Pulmonary effort is normal.     Breath sounds: Normal breath  sounds.  Abdominal:     General: Bowel sounds are normal. There is no distension.     Palpations: Abdomen is soft. There is no mass.     Tenderness: There is no abdominal tenderness. There is no guarding or rebound.     Hernia: No hernia is present.  Musculoskeletal:     Cervical back: Neck supple.     Right lower leg: No edema.  Left lower leg: No edema.  Skin:    General: Skin is dry.     Capillary Refill: Capillary refill takes less than 2 seconds.  Neurological:     Mental Status: She is alert and oriented to person, place, and time.  Psychiatric:        Mood and Affect: Mood normal.        Thought Content: Thought content normal.        Judgment: Judgment normal.      Labs on Admission: I have personally reviewed following labs and imaging studies  CBC: Recent Labs  Lab 04/14/24 1325 04/14/24 1751  WBC 6.1  --   NEUTROABS 3.8  --   HGB 14.7 12.6  HCT 44.1 37.0  MCV 84.0  --   PLT 66*  --    Basic Metabolic Panel: Recent Labs  Lab 04/14/24 1751 04/14/24 1753  NA 137 133*  K 3.3* 3.7  CL 106 106  CO2  --  18*  GLUCOSE 83 91  BUN 11 13  CREATININE 0.50 0.54  CALCIUM  --  7.0*   GFR: Estimated Creatinine Clearance: 85.8 mL/min (by C-G formula based on SCr of 0.54 mg/dL). Liver Function Tests: Recent Labs  Lab 04/14/24 1753  AST 29  ALT 17  ALKPHOS 45  BILITOT 1.4*  PROT 5.4*  ALBUMIN 2.2*   Recent Labs  Lab 04/14/24 1753  LIPASE 19   No results for input(s): "AMMONIA" in the last 168 hours. Coagulation Profile: Recent Labs  Lab 04/14/24 1331  INR 1.1   Cardiac Enzymes: No results for input(s): "CKTOTAL", "CKMB", "CKMBINDEX", "TROPONINI", "TROPONINIHS" in the last 168 hours. BNP (last 3 results) No results for input(s): "BNP" in the last 8760 hours. HbA1C: No results for input(s): "HGBA1C" in the last 72 hours. CBG: Recent Labs  Lab 04/14/24 1141  GLUCAP 111*   Lipid Profile: No results for input(s): "CHOL", "HDL", "LDLCALC",  "TRIG", "CHOLHDL", "LDLDIRECT" in the last 72 hours. Thyroid  Function Tests: No results for input(s): "TSH", "T4TOTAL", "FREET4", "T3FREE", "THYROIDAB" in the last 72 hours. Anemia Panel: No results for input(s): "VITAMINB12", "FOLATE", "FERRITIN", "TIBC", "IRON", "RETICCTPCT" in the last 72 hours. Urine analysis:    Component Value Date/Time   COLORURINE YELLOW 04/14/2024 1723   APPEARANCEUR CLEAR 04/14/2024 1723   LABSPEC 1.003 (L) 04/14/2024 1723   PHURINE 6.0 04/14/2024 1723   GLUCOSEU NEGATIVE 04/14/2024 1723   GLUCOSEU NEGATIVE 03/06/2018 0914   HGBUR NEGATIVE 04/14/2024 1723   BILIRUBINUR NEGATIVE 04/14/2024 1723   BILIRUBINUR Postive 07/06/2022 1006   KETONESUR 5 (A) 04/14/2024 1723   PROTEINUR NEGATIVE 04/14/2024 1723   UROBILINOGEN 0.2 07/06/2022 1006   UROBILINOGEN 0.2 03/06/2018 0914   NITRITE NEGATIVE 04/14/2024 1723   LEUKOCYTESUR NEGATIVE 04/14/2024 1723    Radiological Exams on Admission: I have personally reviewed images CT ABDOMEN PELVIS W CONTRAST Result Date: 04/14/2024 CLINICAL DATA:  Abdominal pain, acute, nonlocalized Patient reports nausea, vomiting, diarrhea and headache since falling 3 days ago. Fever. EXAM: CT ABDOMEN AND PELVIS WITH CONTRAST TECHNIQUE: Multidetector CT imaging of the abdomen and pelvis was performed using the standard protocol following bolus administration of intravenous contrast. RADIATION DOSE REDUCTION: This exam was performed according to the departmental dose-optimization program which includes automated exposure control, adjustment of the mA and/or kV according to patient size and/or use of iterative reconstruction technique. CONTRAST:  OMNIPAQUE  IOHEXOL  300 MG/ML  SOLN COMPARISON:  Same date chest radiographs. Abdominopelvic CT 05/09/2013 FINDINGS: Technical note: Despite efforts by  the technologist and patient, mild motion artifact is present on today's exam and could not be eliminated. This reduces exam sensitivity and  specificity. Lower chest: Small subpleural nodules anteriorly in the right middle lobe measuring up to 4 mm on image 11/5, not previously imaged, but likely benign. Mild atelectasis or scarring at both lung bases. No significant pleural or pericardial effusion. Hepatobiliary: The liver is normal in density without suspicious focal abnormality. No evidence of gallstones, gallbladder wall thickening or biliary dilatation. Pancreas: Unremarkable. No pancreatic ductal dilatation or surrounding inflammatory changes. Spleen: Normal in size without focal abnormality. Adrenals/Urinary Tract: Both adrenal glands appear normal. No evidence of urinary tract calculus, suspicious renal lesion or hydronephrosis. The bladder appears unremarkable for its degree of distention. Stomach/Bowel: No enteric contrast administered. The stomach appears unremarkable for its degree of distension. No evidence of bowel wall thickening, distention or surrounding inflammatory change. The appendix is not optimally visualized due to breathing artifact, although appears grossly unremarkable. No pericecal inflammatory changes are identified. Vascular/Lymphatic: There are no enlarged abdominal or pelvic lymph nodes. No acute vascular findings. Variant anatomy of the celiac trunk. Reproductive: The uterus appears unremarkable. Small collapsing left ovarian follicle. No suspicious adnexal findings. Small amount of free pelvic fluid, within physiologic limits. Other: Intact abdominal wall. Small amount of free pelvic fluid. No pneumoperitoneum. Musculoskeletal: No acute or significant osseous findings. Multilevel lumbar spondylosis with moderate asymmetric left foraminal narrowing at L5-S1. IMPRESSION: 1. No acute findings or explanation for the patient's symptoms. 2. Small collapsing left ovarian follicle with small amount of free pelvic fluid, within physiologic limits. 3. Small subpleural nodules in the right middle lobe, not previously imaged, but  likely benign. Per Fleischner Society guidelines, no routine follow-up imaging is recommended. Electronically Signed   By: Elmon Hagedorn M.D.   On: 04/14/2024 19:33   DG Chest Port 1 View Result Date: 04/14/2024 CLINICAL DATA:  Sepsis EXAM: PORTABLE CHEST 1 VIEW COMPARISON:  March 08, 2022 FINDINGS: The heart size and mediastinal contours are within normal limits. Both lungs are clear. The visualized skeletal structures are unremarkable. IMPRESSION: No active disease. Electronically Signed   By: Fredrich Jefferson M.D.   On: 04/14/2024 15:07     EKG: My personal interpretation of EKG shows: EKG showing normal sinus rhythm heart rate 97    Assessment/Plan: Principal Problem:   Severe sepsis (HCC) Active Problems:   Sepsis secondary to gastroenteritis (HCC)   Essential hypertension   Thrombocytopenia (HCC)   History of sciatica    Assessment and Plan: Severe sepsis secondary to gastroenteritis vs traveler's diarrhea -Patient presenting to emergency department after traveling from [place] 2 months.  Complaining about nausea, vomiting, diarrhea, hypotension and tachycardia referred to ED from PCP clinic for evaluation. - On presentation to ED patient did not had an episode of nausea, vomiting diarrhea.  However she has fever, hypotensive blood pressure 88/51, tachypneic O2 sat 100% room air.  CBC unremarkable stable H&H, normal WBC and however low platelet count 66.  CMP showing low bicarb 18 and normal renal function. Initial lactic acid 3.3 which improved to 1.4 after 3 L of IV bolus in the ED however after fluid bolus initially blood pressure improved then started trending down again. -The patient has been received IV ceftriaxone and IV ciprofloxacin for intra-abdominal infection - As patient is persistently hypotensive giving 4th liter of LR bolus, continue LR 150 cc/h, giving albumin, starting midodrine 10 mg 3 times daily, broadening antibiotic with IV vancomycin and cefepime until rule  out  because of GI infection.  Also adding IV metronidazole for empiric coverage for C. Difficile. - CT abdomen pelvis no acute intra-abdominal finding - Chest x-ray no active disease. -Pending blood culture, GI panel and C. difficile panel. -Starting for liquid diet will advance once patient able to tolerate. -Continue aggressive fluid resuscitation however if still blood pressure remains low/MAP below 65 in that case need to start pressor support.   Thrombocytopenia -Low platelet count 66.  Thrombocytopenia in the setting of sepsis.  Avoid antiplatelet and anticoagulant therapy.  Essential hypertension -At home patient is not any blood pressure regimen currently.  History of sciatica Continue lidocaine  patch.  Continue to as needed for pain control.  Avoid narcotic and Flexeril  in the setting of hypotension   DVT prophylaxis:  SCDs Code Status:  Full Code Diet: Full liquid diet Family Communication:   Family was present at bedside, at the time of interview. Opportunity was given to ask question and all questions were answered satisfactorily.  Disposition Plan: Continue monitor improvement of blood pressure.  If blood pressure drops significantly need to start pressor support. Consults: None at this time Admission status:   Inpatient, progressive unit  Severity of Illness: The appropriate patient status for this patient is INPATIENT. Inpatient status is judged to be reasonable and necessary in order to provide the required intensity of service to ensure the patient's safety. The patient's presenting symptoms, physical exam findings, and initial radiographic and laboratory data in the context of their chronic comorbidities is felt to place them at high risk for further clinical deterioration. Furthermore, it is not anticipated that the patient will be medically stable for discharge from the hospital within 2 midnights of admission.   * I certify that at the point of admission it is my  clinical judgment that the patient will require inpatient hospital care spanning beyond 2 midnights from the point of admission due to high intensity of service, high risk for further deterioration and high frequency of surveillance required.Aaron Aas    Moira Umholtz, MD [facility]  How to contact the [facility] Attending or Consulting provider 7A - 7P or covering provider during after hours 7P -7A, for this patient.  Check the care team in [facility] and look for a) attending/consulting [facility] provider listed and b) the [facility] team listed Log into [email or web address] and use [employer] universal password to access. If you do not have the password, please contact the hospital operator. Locate the [facility] provider you are looking for under [facility] and page to a number that you can be directly reached. If you still have difficulty reaching the provider, please page the [facility] (Director on Call) for the Hospitalists listed on amion for assistance.  04/14/2024, 10:15 PM

## 2024-04-15 DIAGNOSIS — A419 Sepsis, unspecified organism: Secondary | ICD-10-CM | POA: Diagnosis not present

## 2024-04-15 DIAGNOSIS — R6520 Severe sepsis without septic shock: Secondary | ICD-10-CM | POA: Diagnosis not present

## 2024-04-15 LAB — C DIFFICILE QUICK SCREEN W PCR REFLEX
C Diff antigen: NEGATIVE
C Diff interpretation: NOT DETECTED
C Diff toxin: NEGATIVE

## 2024-04-15 LAB — COMPREHENSIVE METABOLIC PANEL WITH GFR
ALT: 12 U/L (ref 0–44)
AST: 18 U/L (ref 15–41)
Albumin: 2.2 g/dL — ABNORMAL LOW (ref 3.5–5.0)
Alkaline Phosphatase: 38 U/L (ref 38–126)
Anion gap: 6 (ref 5–15)
BUN: 11 mg/dL (ref 6–20)
CO2: 21 mmol/L — ABNORMAL LOW (ref 22–32)
Calcium: 7.4 mg/dL — ABNORMAL LOW (ref 8.9–10.3)
Chloride: 108 mmol/L (ref 98–111)
Creatinine, Ser: 0.51 mg/dL (ref 0.44–1.00)
GFR, Estimated: >60 mL/min (ref >60)
Glucose, Bld: 104 mg/dL — ABNORMAL HIGH (ref 70–99)
Potassium: 3.4 mmol/L — ABNORMAL LOW (ref 3.5–5.1)
Sodium: 135 mmol/L (ref 135–145)
Total Bilirubin: 1.3 mg/dL — ABNORMAL HIGH (ref 0.0–1.2)
Total Protein: 5 g/dL — ABNORMAL LOW (ref 6.5–8.1)

## 2024-04-15 LAB — CBC
HCT: 33.7 % — ABNORMAL LOW (ref 36.0–46.0)
Hemoglobin: 11.2 g/dL — ABNORMAL LOW (ref 12.0–15.0)
MCH: 28.2 pg (ref 26.0–34.0)
MCHC: 33.2 g/dL (ref 30.0–36.0)
MCV: 84.9 fL (ref 80.0–100.0)
Platelets: 52 10*3/uL — ABNORMAL LOW (ref 150–400)
RBC: 3.97 MIL/uL (ref 3.87–5.11)
RDW: 13.9 % (ref 11.5–15.5)
WBC: 5 10*3/uL (ref 4.0–10.5)
nRBC: 0 % (ref 0.0–0.2)

## 2024-04-15 LAB — LACTIC ACID, PLASMA
Lactic Acid, Venous: 2.1 mmol/L (ref 0.5–1.9)
Lactic Acid, Venous: 2.7 mmol/L (ref 0.5–1.9)
Lactic Acid, Venous: 2.2 mmol/L (ref 0.5–1.9)

## 2024-04-15 LAB — PROTIME-INR
INR: 1.1 (ref 0.8–1.2)
Prothrombin Time: 14.5 s (ref 11.4–15.2)

## 2024-04-15 LAB — HIV ANTIBODY (ROUTINE TESTING W REFLEX): HIV Screen 4th Generation wRfx: NONREACTIVE

## 2024-04-15 MED ORDER — ORAL CARE MOUTH RINSE: 15.0000 mL | OROMUCOSAL | Status: DC | PRN

## 2024-04-15 MED ORDER — MIDODRINE HCL 5 MG PO TABS
2.5000 mg | ORAL_TABLET | Freq: Three times a day (TID) | ORAL | Status: AC
  Administered 2024-04-15: 2.5 mg via ORAL
  Filled 2024-04-15: qty 1

## 2024-04-15 MED ORDER — POTASSIUM CHLORIDE CRYS ER 20 MEQ PO TBCR
40.0000 meq | EXTENDED_RELEASE_TABLET | Freq: Once | ORAL | Status: AC
  Administered 2024-04-15: 40 meq via ORAL
  Filled 2024-04-15: qty 2

## 2024-04-15 MED ORDER — LACTATED RINGERS IV BOLUS
1000.0000 mL | Freq: Once | INTRAVENOUS | Status: AC
  Administered 2024-04-15: 1000 mL via INTRAVENOUS

## 2024-04-15 NOTE — ED Notes (Signed)
 Patient was unhooked and assisted to the bathroom. Patient was steady on feet, with no issues.

## 2024-04-15 NOTE — Progress Notes (Addendum)
 PROGRESS NOTE  Tami Martin  ZOX:[id number] DOB: 1971/12/02 DOA: 04/14/2024 PCP: Adelia Homestead, MD  Consultants  Brief Narrative: 53 y.o. 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 Patient reported she has been recently traveled from [place], where she was born.  Stated there for 2 months and came to [place]  on 04/07/2024. Patient has sudden onset of nausea, vomiting diarrhea started on Friday for last 48 hours.  Also has associated fever and generalized abdominal discomfort.  Denies any hematemesis, melena, and bright red per rectum.  Patient denies any chest pain, palp shortness of breath.  No other family member is sick around her.   Assessment & Plan: Fever in returning traveler Severe sepsis  - Working diagnosis has been gastroenteritis versus traveler's diarrhea. - She has been afebrile for the past 24 hours.  Also no further vomiting and abdominal pain is resolving. - Hypotension is also resolved. -On IV ceftriaxone and ciprofloxacin for concern for intra-abdominal infection initially and then broadened to IV vancomycin/cefepime/metronidazole -Lactic acid has been elevated but on my exam this afternoon she appears fairly well.  Oral mucous membranes are moist. - Concern also for of course malaria or typhoid in this VFR traveler.  Does report being given malaria prophylaxis and taking this throughout her stay.  -Continue current antibiotics.  If fever recurs or symptoms recur low threshold for infectious disease consult. -Continue current IV fluids.  Continue oral rehydration.   Thrombocytopenia -Low platelet count 66.  Thrombocytopenia in the setting of sepsis.   Avoid antiplatelet and anticoagulant therapy. -Monitor   Essential hypertension -At home patient is not any blood pressure regimen currently.   History of sciatica Continue lidocaine  patch.  Continue to as needed for pain control.  Avoid narcotic and Flexeril  in the setting of hypotension - This has been present for years.  Do not think brucellosis is an issue in this patient because of the chronic back pain despite fact she had fever.    DVT prophylaxis:  SCDs Start: 04/14/24 2123 Place TED hose Start: 04/14/24 2123  Code Status:   Code Status: Full Code Level of care: Progressive Status is: Inpatient Dispo: Likely able to discharge home pending resolution of current symptoms   Consults called: None  Subjective: Patient awake and alert and sitting up in bed on my examination.  Denies any further nausea or vomiting or diarrhea since yesterday.  Has full liquids beside bed and reports she is able to tolerate these.  Denies any headache or vision changes.  Still some very mild abdominal pain.  However her main complaint is chronic sciatica/right sided low back pain.  Objective: Vitals:   04/15/24 1330 04/15/24 1500 04/15/24 1514 04/15/24 1550  BP: 125/78 133/81  135/84  Pulse: 69 70  74  Resp: 20 (!) 32  20  Temp:   99.5 F (37.5 C) 98.9 F (37.2 C)  TempSrc:   Oral Oral  SpO2: 99% 99%  100%  Weight:    84.9 kg  Height:    5\' 6"  (1.676 m)    Intake/Output Summary (Last 24 hours) at 04/15/2024 1609 Last data filed at 04/15/2024 0947 Gross per 24 hour  Intake 2203 ml  Output --  Net 2203 ml   Filed Weights   04/14/24 1145 04/15/24  1550  Weight: 81.6 kg 84.9 kg   Body mass index is 30.21 kg/m.  Gen: 53 y.o. female in no apparent distress.  Nontoxic Pulm: Non-labored breathing.  Clear to auscultation bilaterally.  CV: Regular rate and rhythm. No murmur, rub, or gallop. No JVD GI: Abdomen soft, minimally tender bilateral upper quadrants with no guarding or rebound.   No distention. Ext: Warm, no deformities, no pedal edema Skin: No rashes, lesions no ulcers Neuro: Alert and oriented. No focal neurological deficits. Psych: Calm  Judgement and insight appear normal. Mood & affect appropriate.     I have personally reviewed the following labs and images: CBC: Recent Labs  Lab 04/14/24 1325 04/14/24 1751 04/15/24 0626  WBC 6.1  --  5.0  NEUTROABS 3.8  --   --   HGB 14.7 12.6 11.2*  HCT 44.1 37.0 33.7*  MCV 84.0  --  84.9  PLT 66*  --  52*   BMP &GFR Recent Labs  Lab 04/14/24 1751 04/14/24 1753 04/15/24 0446  NA 137 133* 135  K 3.3* 3.7 3.4*  CL 106 106 108  CO2  --  18* 21*  GLUCOSE 83 91 104*  BUN 11 13 11   CREATININE 0.50 0.54 0.51  CALCIUM  --  7.0* 7.4*   Estimated Creatinine Clearance: 89.2 mL/min (by C-G formula based on SCr of 0.51 mg/dL). Liver & Pancreas: Recent Labs  Lab 04/14/24 1753 04/15/24 0446  AST 29 18  ALT 17 12  ALKPHOS 45 38  BILITOT 1.4* 1.3*  PROT 5.4* 5.0*  ALBUMIN 2.2* 2.2*   Recent Labs  Lab 04/14/24 1753  LIPASE 19   No results for input(s): "AMMONIA" in the last 168 hours. Diabetic: No results for input(s): "HGBA1C" in the last 72 hours. Recent Labs  Lab 04/14/24 1141  GLUCAP 111*   Cardiac Enzymes: No results for input(s): "CKTOTAL", "CKMB", "CKMBINDEX", "TROPONINI" in the last 168 hours. No results for input(s): "PROBNP" in the last 8760 hours. Coagulation Profile: Recent Labs  Lab 04/14/24 1331 04/15/24 0446  INR 1.1 1.1   Thyroid  Function Tests: No results for input(s): "TSH", "T4TOTAL", "FREET4", "T3FREE", "THYROIDAB" in the last 72 hours. Lipid Profile: No results for input(s): "CHOL", "HDL", "LDLCALC", "TRIG", "CHOLHDL", "LDLDIRECT" in the last 72 hours. Anemia Panel: No results for input(s): "VITAMINB12", "FOLATE", "FERRITIN", "TIBC", "IRON", "RETICCTPCT" in the last 72 hours. Urine analysis:    Component Value Date/Time   COLORURINE YELLOW 04/14/2024 1723    APPEARANCEUR CLEAR 04/14/2024 1723   LABSPEC 1.003 (L) 04/14/2024 1723   PHURINE 6.0 04/14/2024 1723   GLUCOSEU NEGATIVE 04/14/2024 1723   GLUCOSEU NEGATIVE 03/06/2018 0914   HGBUR NEGATIVE 04/14/2024 1723   BILIRUBINUR NEGATIVE 04/14/2024 1723   BILIRUBINUR Postive 07/06/2022 1006   KETONESUR 5 (A) 04/14/2024 1723   PROTEINUR NEGATIVE 04/14/2024 1723   UROBILINOGEN 0.2 07/06/2022 1006   UROBILINOGEN 0.2 03/06/2018 0914   NITRITE NEGATIVE 04/14/2024 1723   LEUKOCYTESUR NEGATIVE 04/14/2024 1723   Sepsis Labs: Invalid input(s): "PROCALCITONIN", "LACTICIDVEN"  Microbiology: Recent Results (from the past 240 hours)  Culture, blood (routine x 2)     Status: None (Preliminary result)   Collection Time: 04/14/24  1:15 PM   Specimen: BLOOD RIGHT ARM  Result Value Ref Range Status   Specimen Description   Final    BLOOD RIGHT ARM Performed at [facility], [address] [place], [place] [address]    Special Requests   Final    BOTTLES DRAWN AEROBIC ONLY  Blood Culture results may not be optimal due to an inadequate volume of blood received in culture bottles Performed at [facility], [address], [place], [place] [address]    Culture  Setup Time   Final    GRAM POSITIVE RODS AEROBIC BOTTLE ONLY CRITICAL RESULT CALLED TO, READ BACK BY AND VERIFIED WITH: M LILLISTON,[email or web address]  04/15/24 MK Performed at [facility], [address] [place], [place] [address]    Culture GRAM POSITIVE RODS  Final   Report Status PENDING  Incomplete  Resp panel by RT-PCR (RSV, Flu A&B, Covid) Anterior Nasal Swab     Status: None   Collection Time: 04/14/24  1:52 PM   Specimen: Anterior Nasal Swab  Result Value Ref Range Status   SARS Coronavirus 2 by RT PCR NEGATIVE NEGATIVE Final    Comment: (NOTE) SARS-CoV-2 target nucleic acids are NOT DETECTED.  The SARS-CoV-2 RNA is generally detectable in upper respiratory specimens during the acute phase of infection. The  lowest concentration of SARS-CoV-2 viral copies this assay can detect is 138 copies/mL. A negative result does not preclude SARS-Cov-2 infection and should not be used as the sole basis for treatment or other patient management decisions. A negative result may occur with  improper specimen collection/handling, submission of specimen other than nasopharyngeal swab, presence of viral mutation(s) within the areas targeted by this assay, and inadequate number of viral copies(<138 copies/mL). A negative result must be combined with clinical observations, patient history, and epidemiological information. The expected result is Negative.  Fact Sheet for Patients:  BloggerCourse.com  Fact Sheet for Healthcare Providers:  SeriousBroker.it  This test is no t yet approved or cleared by the [place]  FDA and  has been authorized for detection and/or diagnosis of SARS-CoV-2 by FDA under an Emergency Use Authorization (EUA). This EUA will remain  in effect (meaning this test can be used) for the duration of the COVID-19 declaration under Section 564(b)(1) of the Act, 21 U.S.C.section 360bbb-3(b)(1), unless the authorization is terminated  or revoked sooner.       Influenza A by PCR NEGATIVE NEGATIVE Final   Influenza B by PCR NEGATIVE NEGATIVE Final    Comment: (NOTE) The Xpert Xpress SARS-CoV-2/FLU/RSV plus assay is intended as an aid in the diagnosis of influenza from Nasopharyngeal swab specimens and should not be used as a sole basis for treatment. Nasal washings and aspirates are unacceptable for Xpert Xpress SARS-CoV-2/FLU/RSV testing.  Fact Sheet for Patients: BloggerCourse.com  Fact Sheet for Healthcare Providers: SeriousBroker.it  This test is not yet approved or cleared by the [place]  FDA and has been authorized for detection and/or diagnosis of SARS-CoV-2 by FDA under  an Emergency Use Authorization (EUA). This EUA will remain in effect (meaning this test can be used) for the duration of the COVID-19 declaration under Section 564(b)(1) of the Act, 21 U.S.C. section 360bbb-3(b)(1), unless the authorization is terminated or revoked.     Resp Syncytial Virus by PCR NEGATIVE NEGATIVE Final    Comment: (NOTE) Fact Sheet for Patients: BloggerCourse.com  Fact Sheet for Healthcare Providers: SeriousBroker.it  This test is not yet approved or cleared by the [place]  FDA and has been authorized for detection and/or diagnosis of SARS-CoV-2 by FDA under an Emergency Use Authorization (EUA). This EUA will remain in effect (meaning this test can be used) for the duration of the COVID-19 declaration under Section 564(b)(1) of the Act, 21 U.S.C. section 360bbb-3(b)(1), unless the authorization is terminated or revoked.  Performed at [facility], [address] [place], [place] [address]     Radiology Studies: CT ABDOMEN PELVIS W CONTRAST Result Date: 04/14/2024 CLINICAL DATA:  Abdominal pain, acute, nonlocalized Patient reports nausea, vomiting, diarrhea and headache since falling 3 days ago. Fever. EXAM: CT ABDOMEN AND PELVIS WITH CONTRAST TECHNIQUE: Multidetector CT imaging of the abdomen and pelvis was performed using the standard protocol following bolus administration of intravenous contrast. RADIATION DOSE REDUCTION: This exam was performed according to the departmental dose-optimization program which includes automated exposure control, adjustment of the mA and/or kV according to patient size and/or use of iterative reconstruction technique. CONTRAST:  100mL OMNIPAQUE  IOHEXOL  300 MG/ML  SOLN COMPARISON:  Same date chest radiographs. Abdominopelvic CT 05/09/2013 FINDINGS: Technical note: Despite efforts by the technologist and patient, mild motion artifact is present on today's exam and  could not be eliminated. This reduces exam sensitivity and specificity. Lower chest: Small subpleural nodules anteriorly in the right middle lobe measuring up to 4 mm on image 11/5, not previously imaged, but likely benign. Mild atelectasis or scarring at both lung bases. No significant pleural or pericardial effusion. Hepatobiliary: The liver is normal in density without suspicious focal abnormality. No evidence of gallstones, gallbladder wall thickening or biliary dilatation. Pancreas: Unremarkable. No pancreatic ductal dilatation or surrounding inflammatory changes. Spleen: Normal in size without focal abnormality. Adrenals/Urinary Tract: Both adrenal glands appear normal. No evidence of urinary tract calculus, suspicious renal lesion or hydronephrosis. The bladder appears unremarkable for its degree of distention. Stomach/Bowel: No enteric contrast administered. The stomach appears unremarkable for its degree of distension. No evidence of bowel wall thickening, distention or surrounding inflammatory change. The appendix is not optimally visualized due to breathing artifact, although appears grossly unremarkable. No pericecal inflammatory changes are identified. Vascular/Lymphatic: There are no enlarged abdominal or pelvic lymph nodes. No acute vascular findings. Variant anatomy of the celiac trunk. Reproductive: The uterus appears unremarkable. Small collapsing left ovarian follicle. No suspicious adnexal findings. Small amount of free pelvic fluid, within physiologic limits. Other: Intact abdominal wall. Small amount of free pelvic fluid. No pneumoperitoneum. Musculoskeletal: No acute or significant osseous findings. Multilevel lumbar spondylosis with moderate asymmetric left foraminal narrowing at L5-S1. IMPRESSION: 1. No acute findings or explanation for the patient's symptoms. 2. Small collapsing left ovarian follicle with small amount of free pelvic fluid, within physiologic limits. 3. Small subpleural  nodules in the right middle lobe, not previously imaged, but likely benign. Per Fleischner Society guidelines, no routine follow-up imaging is recommended. Electronically Signed   By: Elmon Hagedorn M.D.   On: 04/14/2024 19:33    Scheduled Meds:  lidocaine   1 patch Transdermal Q24H   sodium chloride  flush  3 mL Intravenous Q12H   sodium chloride  flush  3 mL Intravenous Q12H   Continuous Infusions:  sodium chloride      ceFEPime (MAXIPIME) IV 2 g (04/15/24 1311)   lactated ringers 150 mL/hr at 04/15/24 1550   metronidazole Stopped (04/15/24 1143)   vancomycin       LOS: 1 day   35 minutes with more than 50% spent in reviewing records, counseling patient/family and coordinating care.  Trenton Frock, MD [facility] [email or web address] 04/15/2024, 4:09 PM

## 2024-04-15 NOTE — ED Notes (Signed)
 Patient hooked back up after coming back from the bathroom. Patient reported having a headache. PRN tylenol was offered and patient said yes please.

## 2024-04-15 NOTE — ED Notes (Signed)
 Pt. Transported to room 1405. [place], RN made aware.

## 2024-04-16 DIAGNOSIS — A419 Sepsis, unspecified organism: Secondary | ICD-10-CM | POA: Diagnosis not present

## 2024-04-16 DIAGNOSIS — R6520 Severe sepsis without septic shock: Secondary | ICD-10-CM | POA: Diagnosis not present

## 2024-04-16 LAB — GASTROINTESTINAL PANEL BY PCR, STOOL (REPLACES STOOL CULTURE)

## 2024-04-16 LAB — CBC
HCT: 30.2 % — ABNORMAL LOW (ref 36.0–46.0)
Hemoglobin: 10.1 g/dL — ABNORMAL LOW (ref 12.0–15.0)
MCH: 28.6 pg (ref 26.0–34.0)
MCHC: 33.4 g/dL (ref 30.0–36.0)
MCV: 85.6 fL (ref 80.0–100.0)
Platelets: 86 10*3/uL — ABNORMAL LOW (ref 150–400)
RBC: 3.53 MIL/uL — ABNORMAL LOW (ref 3.87–5.11)
RDW: 14.3 % (ref 11.5–15.5)
WBC: 5 10*3/uL (ref 4.0–10.5)
nRBC: 0 % (ref 0.0–0.2)

## 2024-04-16 LAB — CBC WITH DIFFERENTIAL/PLATELET
Abs Immature Granulocytes: 0.15 10*3/uL — ABNORMAL HIGH (ref 0.00–0.07)
Basophils Absolute: 0 10*3/uL (ref 0.0–0.1)
Basophils Relative: 1 %
Eosinophils Absolute: 0.1 10*3/uL (ref 0.0–0.5)
Eosinophils Relative: 2 %
HCT: 29.3 % — ABNORMAL LOW (ref 36.0–46.0)
Hemoglobin: 10 g/dL — ABNORMAL LOW (ref 12.0–15.0)
Immature Granulocytes: 2 %
Lymphocytes Relative: 21 %
Lymphs Abs: 1.4 10*3/uL (ref 0.7–4.0)
MCH: 27.9 pg (ref 26.0–34.0)
MCHC: 34.1 g/dL (ref 30.0–36.0)
MCV: 81.8 fL (ref 80.0–100.0)
Monocytes Absolute: 0.6 10*3/uL (ref 0.1–1.0)
Monocytes Relative: 10 %
Neutro Abs: 4.3 10*3/uL (ref 1.7–7.7)
Neutrophils Relative %: 64 %
Platelets: 105 10*3/uL — ABNORMAL LOW (ref 150–400)
RBC: 3.58 MIL/uL — ABNORMAL LOW (ref 3.87–5.11)
RDW: 14.2 % (ref 11.5–15.5)
WBC: 6.6 10*3/uL (ref 4.0–10.5)
nRBC: 0 % (ref 0.0–0.2)

## 2024-04-16 LAB — PARASITE EXAM SCREEN, BLOOD-W CONF TO LABCORP (NOT @ ARMC)

## 2024-04-16 LAB — COMPREHENSIVE METABOLIC PANEL WITH GFR
ALT: 13 U/L (ref 0–44)
AST: 17 U/L (ref 15–41)
Albumin: 2.2 g/dL — ABNORMAL LOW (ref 3.5–5.0)
Alkaline Phosphatase: 39 U/L (ref 38–126)
Anion gap: 5 (ref 5–15)
BUN: 8 mg/dL (ref 6–20)
CO2: 22 mmol/L (ref 22–32)
Calcium: 7.9 mg/dL — ABNORMAL LOW (ref 8.9–10.3)
Chloride: 108 mmol/L (ref 98–111)
Creatinine, Ser: 0.56 mg/dL (ref 0.44–1.00)
GFR, Estimated: >60 mL/min (ref >60)
Glucose, Bld: 98 mg/dL (ref 70–99)
Potassium: 3.4 mmol/L — ABNORMAL LOW (ref 3.5–5.1)
Sodium: 135 mmol/L (ref 135–145)
Total Bilirubin: 1.2 mg/dL (ref 0.0–1.2)
Total Protein: 5.3 g/dL — ABNORMAL LOW (ref 6.5–8.1)

## 2024-04-16 MED ORDER — ARTEMETHER-LUMEFANTRINE 20-120 MG PO TABS
4.0000 | ORAL_TABLET | Freq: Three times a day (TID) | ORAL | Status: AC
  Administered 2024-04-16 – 2024-04-17 (×2): 4 via ORAL
  Filled 2024-04-16 (×3): qty 24

## 2024-04-16 MED ORDER — POTASSIUM CHLORIDE CRYS ER 20 MEQ PO TBCR
40.0000 meq | EXTENDED_RELEASE_TABLET | Freq: Once | ORAL | Status: AC
  Administered 2024-04-16: 40 meq via ORAL
  Filled 2024-04-16: qty 2

## 2024-04-16 MED ORDER — ARTEMETHER-LUMEFANTRINE 20-120 MG PO TABS: 4.0000 | ORAL_TABLET | Freq: Three times a day (TID) | ORAL | Status: DC

## 2024-04-16 MED ORDER — ARTEMETHER-LUMEFANTRINE 20-120 MG PO TABS
4.0000 | ORAL_TABLET | Freq: Two times a day (BID) | ORAL | Status: AC
  Administered 2024-04-17 – 2024-04-19 (×4): 4 via ORAL
  Filled 2024-04-16 (×3): qty 4
  Filled 2024-04-16: qty 24
  Filled 2024-04-16: qty 4

## 2024-04-16 MED ORDER — ARTEMETHER-LUMEFANTRINE 20-120 MG PO TABS: 4.0000 | ORAL_TABLET | Freq: Two times a day (BID) | ORAL | Status: DC

## 2024-04-16 MED ORDER — TRAMADOL HCL 50 MG PO TABS
50.0000 mg | ORAL_TABLET | Freq: Two times a day (BID) | ORAL | Status: DC | PRN
  Administered 2024-04-16 – 2024-04-17 (×2): 50 mg via ORAL
  Filled 2024-04-16 (×2): qty 1

## 2024-04-16 NOTE — Progress Notes (Signed)
 Pharmacy Antimicrobial Stewardship Note  18 YOF with recent travel to [place] and only took anti-malarial medications for part of the trip now with concerns for malaria given recent travel and symptoms.  Testing has been ordered to evaluate for malaria and in the meantime - will go ahead and start the patient on treatment dosing of artemether/lumefantrine (Coartem) per discussion with Dr. Zelda Hickman  QTc 417 on 5/12 EKG  Plan - Start Coartem 4 tablets q8h for 2 doses then twice daily for 4 doses to complete a 3d treatment course (doses at 0, 8, 24, 36, 48, and 60 hours) - Will monitor testing results and clinical improvement  Thank you for allowing pharmacy to be a part of this patient's care.  Garland Junk, PharmD, BCPS, BCIDP Infectious Diseases Clinical Pharmacist 04/16/2024 4:11 PM   **Pharmacist phone directory can now be found on [email or web address] ([email or web address]).  Listed under [facility].

## 2024-04-16 NOTE — Progress Notes (Addendum)
 PROGRESS NOTE  Tami Martin  QIO:[id number] DOB: 1971-06-28 DOA: 04/14/2024 PCP: Adelia Homestead, MD  Consultants  **Addendum: 04/16/2024 6:59 PM Contacted by nurse that lab called to say patient's parasite screen returned positive.  Presume this to be positive malaria screen.  Contacted Dr. Artemio Larry on call ID specialist to clarify next steps in treatment.     Brief Narrative: 53 y.o. 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  Patient reported she has been recently traveled from [place], where she was born.  Stated there for 2 months and came to [place]  on 04/07/2024.  Patient has sudden onset of nausea, vomiting diarrhea started on Friday for last 48 hours.  Also has associated fever and generalized abdominal discomfort.  Came to ED and found to be febrile.  Admitted for the same.   Assessment & Plan: Fever in returning traveler Severe sepsis  - Working diagnosis has been gastroenteritis versus traveler's diarrhea. - Concern also for of course malaria or typhoid in this VFR traveler.  Does report being given malaria prophylaxis and taking this throughout her stay. - Consulted infectious disease today in light of dropping hemoglobin.  Question if needs Plasmodium PCR or blood smear.  LFTs good -Reports that still with headache.  Still with abdominal pain.  However no further nausea or vomiting.   Thrombocytopenia -Low platelet count 66 on admission.  Thrombocytopenia in the setting of sepsis.  Avoid antiplatelet and anticoagulant therapy. - Uptrending today to 105.     Essential hypertension -At home patient is not any blood pressure regimen currently.   History of  sciatica Continue lidocaine  patch.  Continue to as needed for pain control.  Avoid narcotic and Flexeril  in the setting of hypotension - Has had chronic back pain on right side present for years/sciatica - However this morning she tells me she now is having left sided back pain extending around pelvic region. - Unclear exposure to unpasteurized milk which would put patient at risk for brucellosis.  Appreciate ID input   Anemia: - Normocytic.  It was dropping since admission but now has stabilized this afternoon.  This is more reassuring.    DVT prophylaxis:  SCDs Start: 04/14/24 2123 Place TED hose Start: 04/14/24 2123  Code Status:   Code Status: Full Code Level of care: Progressive Status is: Inpatient Dispo: Likely able to discharge home pending resolution of current symptoms   Consults called: Infectious disease Subjective: Patient complaining of some left-sided back pain which is new for her.  Chronic right sided back pain/sciatica.  Also with headaches that resolved shortly with Tylenol but then recurs.  Still with some abdominal pain.  No further nausea or vomiting.  Objective: Vitals:   04/16/24 0421 04/16/24 1228 04/16/24 1306 04/16/24 1500  BP: 120/72  (!) 156/86   Pulse: 74  95   Resp: 16  20   Temp: 98.6 F (37 C) 98.7 F (37.1 C) (!) 103.2 F (39.6 C) 100 F (37.8 C)  TempSrc:  Oral Oral Axillary  SpO2: 100%  100%   Weight:      Height:        Intake/Output Summary (Last 24 hours) at 04/16/2024 1705 Last data filed at 04/16/2024 0400 Gross per 24 hour  Intake 3156.61 ml  Output --  Net 3156.61 ml   Filed Weights   04/14/24 1145 04/15/24 1550  Weight: 81.6 kg 84.9 kg   Body mass index is 30.21 kg/m.  Gen: 53 y.o. female in no apparent distress.  Nontoxic Pulm: Non-labored breathing.  Clear to auscultation bilaterally.  CV: Regular rate and rhythm. No murmur, rub, or gallop. No JVD GI: Abdomen soft, minimally tender bilateral upper quadrants with no  guarding or rebound.  No distention. Back: Tender to palpation bilateral lumbar region today.  No rash noted Ext: Warm, no deformities, no pedal edema Skin: No rashes, lesions no ulcers Neuro: Alert and oriented. No focal neurological deficits. Psych: Calm  Judgement and insight appear normal. Mood & affect appropriate.     I have personally reviewed the following labs and images: CBC: Recent Labs  Lab 04/14/24 1325 04/14/24 1751 04/15/24 0626 04/16/24 0533 04/16/24 1443  WBC 6.1  --  5.0 5.0 6.6  NEUTROABS 3.8  --   --   --  4.3  HGB 14.7 12.6 11.2* 10.1* 10.0*  HCT 44.1 37.0 33.7* 30.2* 29.3*  MCV 84.0  --  84.9 85.6 81.8  PLT 66*  --  52* 86* 105*   BMP &GFR Recent Labs  Lab 04/14/24 1751 04/14/24 1753 04/15/24 0446 04/16/24 0533  NA 137 133* 135 135  K 3.3* 3.7 3.4* 3.4*  CL 106 106 108 108  CO2  --  18* 21* 22  GLUCOSE 83 91 104* 98  BUN 11 13 11 8   CREATININE 0.50 0.54 0.51 0.56  CALCIUM  --  7.0* 7.4* 7.9*   Estimated Creatinine Clearance: 89.2 mL/min (by C-G formula based on SCr of 0.56 mg/dL). Liver & Pancreas: Recent Labs  Lab 04/14/24 1753 04/15/24 0446 04/16/24 0533  AST 29 18 17   ALT 17 12 13   ALKPHOS 45 38 39  BILITOT 1.4* 1.3* 1.2  PROT 5.4* 5.0* 5.3*  ALBUMIN 2.2* 2.2* 2.2*   Recent Labs  Lab 04/14/24 1753  LIPASE 19   No results for input(s): "AMMONIA" in the last 168 hours. Diabetic: No results for input(s): "HGBA1C" in the last 72 hours. Recent Labs  Lab 04/14/24 1141  GLUCAP 111*   Cardiac Enzymes: No results for input(s): "CKTOTAL", "CKMB", "CKMBINDEX", "TROPONINI" in the last 168 hours. No results for input(s): "PROBNP" in the last 8760 hours. Coagulation Profile: Recent Labs  Lab 04/14/24 1331 04/15/24 0446  INR 1.1 1.1   Thyroid  Function Tests: No results for input(s): "TSH", "T4TOTAL", "FREET4", "T3FREE", "THYROIDAB" in the last 72 hours. Lipid Profile: No results for input(s): "CHOL", "HDL", "LDLCALC", "TRIG",  "CHOLHDL", "LDLDIRECT" in the last 72 hours. Anemia Panel: No results for input(s): "VITAMINB12", "FOLATE", "FERRITIN", "TIBC", "IRON", "RETICCTPCT" in the last 72 hours. Urine analysis:    Component Value Date/Time   COLORURINE YELLOW 04/14/2024 1723   APPEARANCEUR CLEAR 04/14/2024 1723   LABSPEC 1.003 (L) 04/14/2024 1723   PHURINE 6.0 04/14/2024 1723   GLUCOSEU NEGATIVE 04/14/2024 1723   GLUCOSEU NEGATIVE 03/06/2018 0914   HGBUR NEGATIVE 04/14/2024 1723   BILIRUBINUR NEGATIVE 04/14/2024 1723   BILIRUBINUR Postive 07/06/2022 1006   KETONESUR 5 (A) 04/14/2024 1723   PROTEINUR NEGATIVE 04/14/2024 1723   UROBILINOGEN 0.2 07/06/2022 1006   UROBILINOGEN 0.2 03/06/2018 0914   NITRITE NEGATIVE 04/14/2024 1723   LEUKOCYTESUR NEGATIVE 04/14/2024 1723   Sepsis Labs: Invalid input(s): "PROCALCITONIN", "LACTICIDVEN"  Microbiology: Recent Results (from the past 240 hours)  Culture, blood (routine x 2)     Status: Abnormal (Preliminary result)  Collection Time: 04/14/24  1:15 PM   Specimen: BLOOD RIGHT ARM  Result Value Ref Range Status   Specimen Description   Final    BLOOD RIGHT ARM Performed at [facility], [address], [place], [place] [address]    Special Requests   Final    BOTTLES DRAWN AEROBIC ONLY Blood Culture results may not be optimal due to an inadequate volume of blood received in culture bottles Performed at [facility], [address], [place], [place] [address]    Culture  Setup Time   Final    GRAM POSITIVE RODS AEROBIC BOTTLE ONLY CRITICAL RESULT CALLED TO, READ BACK BY AND VERIFIED WITH: M LILLISTON,[email or web address]  04/15/24 MK    Culture (A)  Final    BACILLUS SPECIES Standardized susceptibility testing for this organism is not available. Performed at [facility], [address] [place], [place] [address]    Report Status PENDING  Incomplete  Resp panel by RT-PCR (RSV, Flu A&B, Covid) Anterior Nasal Swab     Status: None    Collection Time: 04/14/24  1:52 PM   Specimen: Anterior Nasal Swab  Result Value Ref Range Status   SARS Coronavirus 2 by RT PCR NEGATIVE NEGATIVE Final    Comment: (NOTE) SARS-CoV-2 target nucleic acids are NOT DETECTED.  The SARS-CoV-2 RNA is generally detectable in upper respiratory specimens during the acute phase of infection. The lowest concentration of SARS-CoV-2 viral copies this assay can detect is 138 copies/mL. A negative result does not preclude SARS-Cov-2 infection and should not be used as the sole basis for treatment or other patient management decisions. A negative result may occur with  improper specimen collection/handling, submission of specimen other than nasopharyngeal swab, presence of viral mutation(s) within the areas targeted by this assay, and inadequate number of viral copies(<138 copies/mL). A negative result must be combined with clinical observations, patient history, and epidemiological information. The expected result is Negative.  Fact Sheet for Patients:  BloggerCourse.com  Fact Sheet for Healthcare Providers:  SeriousBroker.it  This test is no t yet approved or cleared by the [place]  FDA and  has been authorized for detection and/or diagnosis of SARS-CoV-2 by FDA under an Emergency Use Authorization (EUA). This EUA will remain  in effect (meaning this test can be used) for the duration of the COVID-19 declaration under Section 564(b)(1) of the Act, 21 U.S.C.section 360bbb-3(b)(1), unless the authorization is terminated  or revoked sooner.       Influenza A by PCR NEGATIVE NEGATIVE Final   Influenza B by PCR NEGATIVE NEGATIVE Final    Comment: (NOTE) The Xpert Xpress SARS-CoV-2/FLU/RSV plus assay is intended as an aid in the diagnosis of influenza from Nasopharyngeal swab specimens and should not be used as a sole basis for treatment. Nasal washings and aspirates are unacceptable for  Xpert Xpress SARS-CoV-2/FLU/RSV testing.  Fact Sheet for Patients: BloggerCourse.com  Fact Sheet for Healthcare Providers: SeriousBroker.it  This test is not yet approved or cleared by the [place]  FDA and has been authorized for detection and/or diagnosis of SARS-CoV-2 by FDA under an Emergency Use Authorization (EUA). This EUA will remain in effect (meaning this test can be used) for the duration of the COVID-19 declaration under Section 564(b)(1) of the Act, 21 U.S.C. section 360bbb-3(b)(1), unless the authorization is terminated or revoked.     Resp Syncytial Virus by PCR NEGATIVE NEGATIVE Final    Comment: (NOTE) Fact Sheet for Patients: BloggerCourse.com  Fact Sheet for  Healthcare Providers: SeriousBroker.it  This test is not yet approved or cleared by the [place]  FDA and has been authorized for detection and/or diagnosis of SARS-CoV-2 by FDA under an Emergency Use Authorization (EUA). This EUA will remain in effect (meaning this test can be used) for the duration of the COVID-19 declaration under Section 564(b)(1) of the Act, 21 U.S.C. section 360bbb-3(b)(1), unless the authorization is terminated or revoked.  Performed at [facility], [address] [place], [place] [address]   Gastrointestinal Panel by PCR , Stool     Status: None   Collection Time: 04/15/24 10:57 PM   Specimen: STOOL  Result Value Ref Range Status   Campylobacter species NOT DETECTED NOT DETECTED Final   Plesimonas shigelloides NOT DETECTED NOT DETECTED Final   Salmonella species NOT DETECTED NOT DETECTED Final   Yersinia enterocolitica NOT DETECTED NOT DETECTED Final   Vibrio species NOT DETECTED NOT DETECTED Final   Vibrio cholerae NOT DETECTED NOT DETECTED Final   Enteroaggregative E coli (EAEC) NOT DETECTED NOT DETECTED Final   Enteropathogenic E coli (EPEC)  NOT DETECTED NOT DETECTED Final   Enterotoxigenic E coli (ETEC) NOT DETECTED NOT DETECTED Final   Shiga like toxin producing E coli (STEC) NOT DETECTED NOT DETECTED Final   Shigella/Enteroinvasive E coli (EIEC) NOT DETECTED NOT DETECTED Final   Cryptosporidium NOT DETECTED NOT DETECTED Final   Cyclospora cayetanensis NOT DETECTED NOT DETECTED Final   Entamoeba histolytica NOT DETECTED NOT DETECTED Final   Giardia lamblia NOT DETECTED NOT DETECTED Final   Adenovirus F40/41 NOT DETECTED NOT DETECTED Final   Astrovirus NOT DETECTED NOT DETECTED Final   Norovirus GI/GII NOT DETECTED NOT DETECTED Final   Rotavirus A NOT DETECTED NOT DETECTED Final   Sapovirus (I, II, IV, and V) NOT DETECTED NOT DETECTED Final    Comment: Performed at [facility], [address] [place], [place] [address]  C Difficile Quick Screen w PCR reflex     Status: None   Collection Time: 04/15/24 10:57 PM   Specimen: STOOL  Result Value Ref Range Status   C Diff antigen NEGATIVE NEGATIVE Final   C Diff toxin NEGATIVE NEGATIVE Final   C Diff interpretation No C. difficile detected.  Final    Comment: Performed at [facility], [address], [place], [place] [address]    Radiology Studies: No results found.   Scheduled Meds:  artemether-lumefantrine  4 tablet Oral Q8H   Followed by   Cecily Cohen ON 04/17/2024] artemether-lumefantrine  4 tablet Oral Q12H   lidocaine   1 patch Transdermal Q24H   sodium chloride  flush  3 mL Intravenous Q12H   sodium chloride  flush  3 mL Intravenous Q12H   Continuous Infusions:  ceFEPime (MAXIPIME) IV 2 g (04/16/24 1318)   metronidazole 500 mg (04/16/24 1042)     LOS: 2 days   35 minutes with more than 50% spent in reviewing records, counseling patient/family and coordinating care.  Trenton Frock, MD [facility] [email or web address] 04/16/2024, 5:05 PM

## 2024-04-16 NOTE — Plan of Care (Signed)

## 2024-04-16 NOTE — Consult Note (Signed)
 [facility]    Date of Admission:  04/14/2024   Total days of inpatient antibiotics 2        Reason for Consult: fever    Principal Problem:   Severe sepsis [facility]) Active Problems:   Essential hypertension   Sepsis secondary to gastroenteritis (HCC)   Thrombocytopenia (HCC)   History of sciatica   Assessment: 53 year old female who recently traveled from [place] x 2 months [place]  Apr 04[place]24.  Around 04/11/2024 she started having nausea vomiting diarrhea.  Found to have: #Sepsis secondary to unclear etiology #Concern for malaria given - Conservatively giving hemoglobin is 10.1, WBC 86, LFTs are normal. - Will get differential which will include peripheral smear if abnormal.  Will add molecular testing as well.  Recommendations:  -Plasmodium pcr and blood screen -DC vancomycin.  Okay to continue cefepime and metronidazole for now -Added diff to cbc -repeat blood Cx Microbiology:   Antibiotics: Cefepime 5/12- Ceftiraxone 5/12 Ciprofloxacin 5/12 Metronidazole 5/12- Vancomycin 5/12-12  Cultures: Blood 5/12 1/1 bacillus sp Urine  Other   HPI: Rovena F Mchan is a 53 y.o. female with past medical history of hypertension, prediabetes, chronic low back pain due to right-sided sciatica, chronic after rhinitis presented to the ED from PCP clinic complaining nausea, vomiting, diarrhea, headache, fall, dizziness hypotension and tachycardia.  She had recent travel from [place] and [place]  on [place] 04, [place].  Nausea vomiting diarrhea started 48 hours prior to admission.  On arrival patient had temp of 102, platelets 66.  GIP and C. difficile negative.  ID engaged as patient continued fever on broad-spectrum antibiotics   Review of Systems: Review of Systems  All other systems reviewed and are negative.   Past Medical History:  Diagnosis Date   Allergy    Seasonal allergies    DDD (degenerative  disc disease), lumbosacral 2011   GERD (gastroesophageal reflux disease)    denies today 07/2016   Prediabetes 2014    Social History   Tobacco Use   Smoking status: Never   Smokeless tobacco: Never  Substance Use Topics   Alcohol use: Yes    Comment: once month-wine   Drug use: No    Family History  Problem Relation Age of Onset   Breast cancer Neg Hx    Scheduled Meds:  lidocaine   1 patch Transdermal Q24H   sodium chloride  flush  3 mL Intravenous Q12H   sodium chloride  flush  3 mL Intravenous Q12H   Continuous Infusions:  ceFEPime (MAXIPIME) IV 2 g (04/16/24 1318)   metronidazole 500 mg (04/16/24 1042)   vancomycin 1,750 mg (04/16/24 0124)   PRN Meds:.acetaminophen **OR** acetaminophen, ondansetron **OR** ondansetron (ZOFRAN) IV, mouth rinse, sodium chloride  flush, traMADol  No Known Allergies  OBJECTIVE: Blood pressure (!) 156/86, pulse 95, temperature (!) 103.2 F (39.6 C), temperature source Oral, resp. rate 20, height 5\' 6"  (1.676 m), weight 84.9 kg, SpO2 100%.  Physical Exam Constitutional:      Appearance: Normal appearance.  HENT:     Head: Normocephalic and atraumatic.     Right Ear: Tympanic membrane normal.     Left Ear: Tympanic membrane normal.     Nose: Nose normal.     Mouth/Throat:     Mouth: Mucous membranes are moist.  Eyes:     Extraocular Movements: Extraocular movements intact.     Conjunctiva/sclera: Conjunctivae normal.     Pupils:  Pupils are equal, round, and reactive to light.  Cardiovascular:     Rate and Rhythm: Normal rate and regular rhythm.     Heart sounds: No murmur heard.    No friction rub. No gallop.  Pulmonary:     Effort: Pulmonary effort is normal.     Breath sounds: Normal breath sounds.  Abdominal:     General: Abdomen is flat.     Palpations: Abdomen is soft.  Musculoskeletal:        General: Normal range of motion.  Skin:    General: Skin is warm and dry.  Neurological:     General: No focal deficit present.      Mental Status: She is alert and oriented to person, place, and time.  Psychiatric:        Mood and Affect: Mood normal.     Lab Results Lab Results  Component Value Date   WBC 5.0 04/16/2024   HGB 10.1 (L) 04/16/2024   HCT 30.2 (L) 04/16/2024   MCV 85.6 04/16/2024   PLT 86 (L) 04/16/2024    Lab Results  Component Value Date   CREATININE 0.56 04/16/2024   BUN 8 04/16/2024   NA 135 04/16/2024   K 3.4 (L) 04/16/2024   CL 108 04/16/2024   CO2 22 04/16/2024    Lab Results  Component Value Date   ALT 13 04/16/2024   AST 17 04/16/2024   ALKPHOS 39 04/16/2024   BILITOT 1.2 04/16/2024       Orlie Bjornstad, MD [facility] 04/16/2024, 2:13 PM

## 2024-04-17 ENCOUNTER — Other Ambulatory Visit (HOSPITAL_COMMUNITY): Payer: Self-pay

## 2024-04-17 DIAGNOSIS — A419 Sepsis, unspecified organism: Secondary | ICD-10-CM | POA: Diagnosis not present

## 2024-04-17 DIAGNOSIS — B54 Unspecified malaria: Secondary | ICD-10-CM | POA: Diagnosis not present

## 2024-04-17 DIAGNOSIS — D649 Anemia, unspecified: Secondary | ICD-10-CM | POA: Insufficient documentation

## 2024-04-17 DIAGNOSIS — R6520 Severe sepsis without septic shock: Secondary | ICD-10-CM | POA: Diagnosis not present

## 2024-04-17 LAB — CBC
HCT: 31.8 % — ABNORMAL LOW (ref 36.0–46.0)
Hemoglobin: 10.6 g/dL — ABNORMAL LOW (ref 12.0–15.0)
MCH: 28.8 pg (ref 26.0–34.0)
MCHC: 33.3 g/dL (ref 30.0–36.0)
MCV: 86.4 fL (ref 80.0–100.0)
Platelets: 139 10*3/uL — ABNORMAL LOW (ref 150–400)
RBC: 3.68 MIL/uL — ABNORMAL LOW (ref 3.87–5.11)
RDW: 14.6 % (ref 11.5–15.5)
WBC: 7.7 10*3/uL (ref 4.0–10.5)
nRBC: 0.3 % — ABNORMAL HIGH (ref 0.0–0.2)

## 2024-04-17 LAB — CULTURE, BLOOD (ROUTINE X 2)

## 2024-04-17 LAB — COMPREHENSIVE METABOLIC PANEL WITH GFR
ALT: 12 U/L (ref 0–44)
AST: 20 U/L (ref 15–41)
Albumin: 2.4 g/dL — ABNORMAL LOW (ref 3.5–5.0)
Alkaline Phosphatase: 45 U/L (ref 38–126)
Anion gap: 4 — ABNORMAL LOW (ref 5–15)
BUN: 6 mg/dL (ref 6–20)
CO2: 24 mmol/L (ref 22–32)
Calcium: 7.8 mg/dL — ABNORMAL LOW (ref 8.9–10.3)
Chloride: 106 mmol/L (ref 98–111)
Creatinine, Ser: 0.5 mg/dL (ref 0.44–1.00)
GFR, Estimated: >60 mL/min (ref >60)
Glucose, Bld: 118 mg/dL — ABNORMAL HIGH (ref 70–99)
Potassium: 3.5 mmol/L (ref 3.5–5.1)
Sodium: 134 mmol/L — ABNORMAL LOW (ref 135–145)
Total Bilirubin: 1.2 mg/dL (ref 0.0–1.2)
Total Protein: 6.2 g/dL — ABNORMAL LOW (ref 6.5–8.1)

## 2024-04-17 LAB — LACTIC ACID, PLASMA: Lactic Acid, Venous: 1.3 mmol/L (ref 0.5–1.9)

## 2024-04-17 LAB — PARASITE EXAM SCREEN, BLOOD-W CONF TO LABCORP (NOT @ ARMC)

## 2024-04-17 MED ORDER — KETOROLAC TROMETHAMINE 15 MG/ML IJ SOLN
15.0000 mg | Freq: Four times a day (QID) | INTRAMUSCULAR | Status: DC | PRN
  Administered 2024-04-17 – 2024-04-18 (×3): 15 mg via INTRAVENOUS
  Filled 2024-04-17 (×3): qty 1

## 2024-04-17 MED ORDER — OXYCODONE HCL 5 MG PO TABS
5.0000 mg | ORAL_TABLET | ORAL | Status: DC | PRN
  Administered 2024-04-18 – 2024-04-19 (×3): 5 mg via ORAL
  Filled 2024-04-17 (×3): qty 1

## 2024-04-17 MED ORDER — SODIUM CHLORIDE 0.9 % IV SOLN: INTRAVENOUS | Status: AC

## 2024-04-17 NOTE — TOC Initial Note (Signed)
 Transition of Care [facility]) - Initial/Assessment Note    Patient Details  Name: Tami Martin MRN: [id number] Date of Birth: [birth year]  [facility]) CM/SW Contact:    Ruben Corolla, RN Phone Number: 04/17/2024, 11:40 AM  Clinical Narrative: d/c plan home.                  Expected Discharge Plan: Home/Self Care Barriers to Discharge: Continued Medical Work up   Patient Goals and CMS Choice Patient states their goals for this hospitalization and ongoing recovery are:: Home CMS Medicare.gov Compare Post Acute Care list provided to:: Patient Choice offered to / list presented to : Patient [place] ownership interest in [facility].provided to:: Patient    Expected Discharge Plan and Services                                              Prior Living Arrangements/Services                       Activities of Daily Living   ADL Screening (condition at time of admission) Independently performs ADLs?: Yes (appropriate for developmental age) Is the patient deaf or have difficulty hearing?: No Does the patient have difficulty seeing, even when wearing glasses/contacts?: No Does the patient have difficulty concentrating, remembering, or making decisions?: No  Permission Sought/Granted                  Emotional Assessment              Admission diagnosis:  Severe sepsis (HCC) [A41.9, R65.20] Patient Active Problem List   Diagnosis Date Noted   Acute gastroenteritis 04/14/2024   Generalized weakness 04/14/2024   Dizziness 04/14/2024   Hypotension due to hypovolemia 04/14/2024   Family history of catecholaminergic polymorphic ventricular tachycardia 04/14/2024   Tachycardia 04/14/2024   Lumbar spondylosis 04/14/2024   Sepsis secondary to gastroenteritis (HCC) 04/14/2024   Thrombocytopenia (HCC) 04/14/2024   History of sciatica 04/14/2024   Severe sepsis (HCC) 04/14/2024   Right lower quadrant abdominal pain  07/06/2022   Right lower quadrant abdominal tenderness with rebound tenderness 07/06/2022   Acute cystitis without hematuria 07/06/2022   Chronic bilateral low back pain with right-sided sciatica 03/09/2022   Hot flashes 09/16/2021   Acute sinusitis 08/15/2018   Pes planus 10/11/2017   Prediabetes 08/29/2016   GERD (gastroesophageal reflux disease) 08/29/2016   Acute intractable headache 01/22/2015   Encounter for general adult medical examination with abnormal findings 09/11/2014   Essential hypertension 09/11/2013   Allergic rhinitis 06/10/2013   Sciatica neuralgia 03/04/2013   PCP:  Adelia Homestead, MD Pharmacy:   [id number] - [place] - [address] [place] [address] Phone: [phone] Fax: [phone]  [facility] - [place], [place] - [address] [place] [address] Phone: [phone] Fax: [phone]  [id number] - [place], [place] - [address] AT [facility] [address] [place] [address] Phone: [phone] Fax: [phone]  [facility] [place] - [place] - [address] [place] [address] Phone: [phone] Fax: [phone]     Social Drivers of Health (SDOH) Social History: SDOH Screenings   Food Insecurity:  No Food Insecurity (04/15/2024)  Housing: Low Risk  (04/15/2024)  Transportation Needs: No Transportation Needs (04/15/2024)  Utilities: Not At Risk (04/15/2024)  Depression (PHQ2-9): Low Risk  (01/04/2023)  Tobacco Use: Low Risk  (04/14/2024)   SDOH Interventions:     Readmission Risk Interventions     No data to display

## 2024-04-17 NOTE — Hospital Course (Signed)
 53 y.o. F with HTN, presented with N/V, diarrhea, headache, hypotension and headache.  Found in the ER to have fever, thrombocytopenia.  Admitted for fever in returning traveler.  ID consulted.

## 2024-04-17 NOTE — Plan of Care (Signed)

## 2024-04-17 NOTE — Assessment & Plan Note (Signed)
 Presented with fever, tachcyardia, elevated lactic acid, and thrombocytopenia 56K after return from [place].  Thin smear positive for Plasmodia. - Continue Coartem - Consult ID - Follow Plasmodia PCR

## 2024-04-17 NOTE — Progress Notes (Signed)
 [facility]  Date of Admission:  04/14/2024   Total days of inpatient antibiotics 3  Principal Problem:   Severe sepsis [facility]) Active Problems:   Essential hypertension   Sepsis secondary to gastroenteritis (HCC)   Thrombocytopenia (HCC)   History of sciatica          Assessment: 53 year old female who recently traveled from [place] x 2 months [place] to the [place]  Apr 04, 2024.  Around 04/11/2024 she started having nausea vomiting diarrhea.  Found to have: #Sepsis secondary to Malaria -Only took 30 day of malaria ppx during travel - Conservatively giving hemoglobin is 10.1, WBC 86, LFTs are normal. -Does not meet criterion for severe malaria so far, await  % parasitosis, will get repeat parasite screen to trend    #bacillus sp in blood -contaminant  Recommendations:  -Plasmodium pcr pending. Screen+ plasmodium. Coartem started on 5/14 -Stop cefepime and metro -Repeat parasite exam to track decline in parasitosis given pt had prolonged travel to [place]. -repeat blood Cx -monitor clinically  Evaluation of this patient requires complex antimicrobial therapy evaluation and counseling + isolation needs for disease transmission risk assessment and mitigation   Microbiology:   Antibiotics:  CulCefepime 5/12- Ceftiraxone 5/12 Ciprofloxacin 5/12 Metronidazole 5/12- Vancomycin 5/12-12   Cultures: Blood 5/12 1/1 bacillus sp   SUBJECTIVE: Resting in bed. Family at bedside.  Interval: tmax 103.2 overnight, wbc 7.7.  Review of Systems: Review of Systems  All other systems reviewed and are negative.    Scheduled Meds:  artemether-lumefantrine  4 tablet Oral Q12H   lidocaine   1 patch Transdermal Q24H   sodium chloride  flush  3 mL Intravenous Q12H   sodium chloride  flush  3 mL Intravenous Q12H   Continuous Infusions:  sodium chloride  100 mL/hr at 04/17/24 1128   PRN Meds:.acetaminophen **OR**  acetaminophen, ketorolac, ondansetron **OR** ondansetron (ZOFRAN) IV, mouth rinse, oxyCODONE, sodium chloride  flush No Known Allergies  OBJECTIVE: Vitals:   04/16/24 2155 04/16/24 2305 04/17/24 0441 04/17/24 0805  BP:  (!) 145/83 (!) 166/90 (!) 162/95  Pulse: 88 85 91 92  Resp:  20 18 20   Temp: 100.2 F (37.9 C) 99.4 F (37.4 C) 99.7 F (37.6 C) 100 F (37.8 C)  TempSrc: Oral  Oral Oral  SpO2: 94% 95% 91% 92%  Weight:      Height:       Body mass index is 30.21 kg/m.  Physical Exam Constitutional:      Appearance: Normal appearance.  HENT:     Head: Normocephalic and atraumatic.     Right Ear: Tympanic membrane normal.     Left Ear: Tympanic membrane normal.     Nose: Nose normal.     Mouth/Throat:     Mouth: Mucous membranes are moist.  Eyes:     Extraocular Movements: Extraocular movements intact.     Conjunctiva/sclera: Conjunctivae normal.     Pupils: Pupils are equal, round, and reactive to light.  Cardiovascular:     Rate and Rhythm: Normal rate and regular rhythm.     Heart sounds: No murmur heard.    No friction rub. No gallop.  Pulmonary:     Effort: Pulmonary effort is normal.     Breath sounds: Normal breath sounds.  Abdominal:     General: Abdomen is flat.     Palpations: Abdomen is soft.  Musculoskeletal:        General: Normal range of motion.  Skin:    General: Skin is warm and dry.  Neurological:     General: No focal deficit present.     Mental Status: She is alert and oriented to person, place, and time.  Psychiatric:        Mood and Affect: Mood normal.       Lab Results Lab Results  Component Value Date   WBC 7.7 04/17/2024   HGB 10.6 (L) 04/17/2024   HCT 31.8 (L) 04/17/2024   MCV 86.4 04/17/2024   PLT 139 (L) 04/17/2024    Lab Results  Component Value Date   CREATININE 0.50 04/17/2024   BUN 6 04/17/2024   NA 134 (L) 04/17/2024   K 3.5 04/17/2024   CL 106 04/17/2024   CO2 24 04/17/2024    Lab Results  Component Value  Date   ALT 12 04/17/2024   AST 20 04/17/2024   ALKPHOS 45 04/17/2024   BILITOT 1.2 04/17/2024        Orlie Bjornstad, MD [facility] 04/17/2024, 12:52 PM

## 2024-04-17 NOTE — Progress Notes (Signed)
  Progress Note   Patient: Tami Martin ZOX:[id number] DOB: Jan 01, 1971 DOA: 04/14/2024     3 DOS: the patient was seen and examined on 04/17/2024 at 9:46AM      Brief hospital course: 53 y.o. F with HTN, presented with N/V, diarrhea, headache, hypotension and headache.  Found in the ER to have fever, thrombocytopenia.  Admitted for fever in returning traveler.  ID consulted.     Assessment and Plan: * Sepsis due to malaria Presented with fever, tachcyardia, elevated lactic acid, and thrombocytopenia 56K after return from [place].  Thin smear positive for Plasmodia. - Continue Coartem - Consult ID - Follow Plasmodia PCR    Thrombocytopenia Due to sepsis/malaria.  Resolved.  Normocytic anemia Mild, stable  Elevated blood pressure reading Prior history of hypertension, more recently BP normal off meds. - Follow up with PCP  Positive blood culture Bacillus in 1/2.  Likely contaminant.  Hyponatremia Mild - IV fluids overnight and repeat BMP     Subjective: Headache, fever, body aches, neck ache, chills, malaise.  No confusion, no respiraotry symptom.      Physical Exam: BP (!) 162/95 (BP Location: Left Arm)   Pulse 92   Temp 100 F (37.8 C) (Oral)   Resp 20   Ht 5\' 6"  (1.676 m)   Wt 84.9 kg   SpO2 92%   BMI 30.21 kg/m   Adult female, lying in bed, interactive and appropriate RRR, no murmurs, no peripheral edema Respiratory normal, lungs clear without rales or wheezes Abdomen soft no tenderness palpation or guarding, no ascites or distention Attention normal, affect normal, judgment and insight appear normal    Data Reviewed: Thin smear reviewed, positive for Plasmodium CBC shows platelets up to 139, hemoglobin stable at 10.9 Basic metabolic panel shows mild hyponatremia, mild hypokalemia, normal renal function, calcium corrects to normal    Family Communication: None present    Disposition: Status is:  Inpatient         Author: Ephriam Hashimoto, MD 04/17/2024 1:49 PM  For on call review [email or web address].

## 2024-04-17 NOTE — TOC Benefit Eligibility Note (Signed)
 Pharmacy Patient Advocate Encounter  Insurance verification completed.    The patient is insured through [facility] MEDICAID.     Ran test claim for Coratem 20-120mg  and the current 30 day co-pay is $4.   This test claim was processed through [employer]- copay amounts may vary at other pharmacies due to [employer], or as the patient moves through the different stages of their insurance plan.

## 2024-04-18 DIAGNOSIS — A499 Bacterial infection, unspecified: Secondary | ICD-10-CM

## 2024-04-18 DIAGNOSIS — B54 Unspecified malaria: Secondary | ICD-10-CM | POA: Diagnosis not present

## 2024-04-18 DIAGNOSIS — A419 Sepsis, unspecified organism: Secondary | ICD-10-CM | POA: Diagnosis not present

## 2024-04-18 DIAGNOSIS — R6520 Severe sepsis without septic shock: Secondary | ICD-10-CM | POA: Diagnosis not present

## 2024-04-18 LAB — PARASITE EXAM, BLOOD: Parasite Exam, Blood: POSITIVE — AB

## 2024-04-18 NOTE — Progress Notes (Addendum)
 [facility]  Date of Admission:  04/14/2024   Total days of inpatient antibiotics 3  Principal Problem:   Sepsis due to malaria Active Problems:   Thrombocytopenia (HCC)   Normocytic anemia          Assessment: 53 year old female who recently traveled from [place] x 2 [place]  [place]24.  Around 04/11/2024 she started having nausea vomiting diarrhea.  Found to have: #Uncomplicated malaria -Only took 30 day of malaria ppx during travel - Conservatively giving hemoglobin is 10.1, WBC 86, LFTs are normal. -Does not meet criterion for severe malaria. 5/14 parasite exam noted 0.1% parasitemia.     #bacillus sp in blood -contaminant  Recommendations:  -Complete coartem x 3 days. -Parasite screen in AM ordered -F/U with pcp for parasite results to ensure % parasitosis does not trend up. -Above plan communicated with primary - Ill give her an appt with ID in case there are any concerns with testing.  -ID will sign off   Evaluation of this patient requires complex antimicrobial therapy evaluation and counseling + isolation needs for disease transmission risk assessment and mitigation   Microbiology:   Antibiotics:  CulCefepime 5/12- Ceftiraxone 5/12 Ciprofloxacin 5/12 Metronidazole 5/12- Vancomycin 5/12-12   Cultures: Blood 5/12 1/1 bacillus sp   SUBJECTIVE: Resting in bed.  Interval: afebrile overnight  Review of Systems: Review of Systems  All other systems reviewed and are negative.    Scheduled Meds:  artemether-lumefantrine  4 tablet Oral Q12H   lidocaine   1 patch Transdermal Q24H   sodium chloride  flush  3 mL Intravenous Q12H   sodium chloride  flush  3 mL Intravenous Q12H   Continuous Infusions:   PRN Meds:.acetaminophen **OR** acetaminophen, ketorolac, ondansetron **OR** ondansetron (ZOFRAN) IV, mouth rinse, oxyCODONE, sodium chloride  flush No Known  Allergies  OBJECTIVE: Vitals:   04/17/24 1400 04/17/24 2150 04/18/24 0450 04/18/24 1307  BP: (!) 137/105 (!) 175/99 139/84 (!) 157/93  Pulse: 75 85 79 79  Resp: 20 18 20 20   Temp: 98.3 F (36.8 C) 99 F (37.2 C) 98.9 F (37.2 C) 97.9 F (36.6 C)  TempSrc: Oral Oral Oral Oral  SpO2: 97% 95% 90% 95%  Weight:      Height:       Body mass index is 30.21 kg/m.  Physical Exam Constitutional:      Appearance: Normal appearance.  HENT:     Head: Normocephalic and atraumatic.     Right Ear: Tympanic membrane normal.     Left Ear: Tympanic membrane normal.     Nose: Nose normal.     Mouth/Throat:     Mouth: Mucous membranes are moist.  Eyes:     Extraocular Movements: Extraocular movements intact.     Conjunctiva/sclera: Conjunctivae normal.     Pupils: Pupils are equal, round, and reactive to light.  Cardiovascular:     Rate and Rhythm: Normal rate and regular rhythm.     Heart sounds: No murmur heard.    No friction rub. No gallop.  Pulmonary:     Effort: Pulmonary effort is normal.     Breath sounds: Normal breath sounds.  Abdominal:     General: Abdomen is flat.     Palpations: Abdomen is soft.  Musculoskeletal:        General: Normal range of motion.  Skin:    General: Skin is warm and dry.  Neurological:  General: No focal deficit present.     Mental Status: She is alert and oriented to person, place, and time.  Psychiatric:        Mood and Affect: Mood normal.       Lab Results Lab Results  Component Value Date   WBC 7.7 04/17/2024   HGB 10.6 (L) 04/17/2024   HCT 31.8 (L) 04/17/2024   MCV 86.4 04/17/2024   PLT 139 (L) 04/17/2024    Lab Results  Component Value Date   CREATININE 0.50 04/17/2024   BUN 6 04/17/2024   NA 134 (L) 04/17/2024   K 3.5 04/17/2024   CL 106 04/17/2024   CO2 24 04/17/2024    Lab Results  Component Value Date   ALT 12 04/17/2024   AST 20 04/17/2024   ALKPHOS 45 04/17/2024   BILITOT 1.2 04/17/2024         Orlie Bjornstad, MD [facility] 04/18/2024, 2:58 PM

## 2024-04-18 NOTE — Progress Notes (Signed)
 I was alerted by Microbiology of P falciparum results.  Had already discussed results with ID.  See notes and orders for further details.

## 2024-04-18 NOTE — Progress Notes (Signed)
  Progress Note   Patient: Tami Martin XBJ:[id number] DOB: Aug 07, 1971 DOA: 04/14/2024     4 DOS: the patient was seen and examined on 04/18/2024 at 12:08PM      Brief hospital course: 53 y.o. F with HTN, presented with N/V, diarrhea, headache, hypotension and headache.  Found in the ER to have fever, thrombocytopenia.  Admitted for fever in returning traveler.  ID consulted.     Assessment and Plan: * Sepsis due to malaria Presented with fever, tachcyardia, elevated lactic acid, and thrombocytopenia 56K after return from [place].  Thin smear positive for Plasmodia. - Continue Coartem - Consult ID - Follow Plasmodia PCR     Thrombocytopenia Resolved   Normocytic anemia Trend CBC   Elevated blood pressure reading - Resume amlodipine           Subjective: Headache and neck pain are improving but still severe.  Some pleuritic pain.Right wrist pain noted ,mild.  Some right sided aching. No further fever, no respiratory symptoms.     Physical Exam: BP (!) 157/93 (BP Location: Right Arm)   Pulse 79   Temp 97.9 F (36.6 C) (Oral)   Resp 20   Ht 5\' 6"  (1.676 m)   Wt 84.9 kg   SpO2 95%   BMI 30.21 kg/m   Adult female, sitting up in bed, interactive and appropriate RRR, no murmurs, no peripheral edema Respiratory normal, lungs clear without rales or wheezes The neck is not tender to palpation, no deformity, no redness, no swelling Attention normal, affect appropriate, judgment and insight appear normal, face symmetric, speech fluent, moves all extremities with generalized weakness but symmetric coordination  Data Reviewed: Discussed with infectious disease   Family Communication: sister at bedsdie    Disposition: Status is: Inpatient She is still substantially symptomatic, and ID are awaiting testing from [facility] prior to finalizing antibiotic treatment        Author: Ephriam Hashimoto, MD 04/18/2024 4:42 PM  For on call  review [email or web address].

## 2024-04-18 NOTE — Plan of Care (Signed)

## 2024-04-19 ENCOUNTER — Inpatient Hospital Stay (HOSPITAL_COMMUNITY)

## 2024-04-19 DIAGNOSIS — A419 Sepsis, unspecified organism: Secondary | ICD-10-CM | POA: Diagnosis not present

## 2024-04-19 LAB — BASIC METABOLIC PANEL WITH GFR
Anion gap: 6 (ref 5–15)
BUN: <5 mg/dL — ABNORMAL LOW (ref 6–20)
CO2: 27 mmol/L (ref 22–32)
Calcium: 8.2 mg/dL — ABNORMAL LOW (ref 8.9–10.3)
Chloride: 104 mmol/L (ref 98–111)
Creatinine, Ser: 0.53 mg/dL (ref 0.44–1.00)
GFR, Estimated: >60 mL/min (ref >60)
Glucose, Bld: 113 mg/dL — ABNORMAL HIGH (ref 70–99)
Potassium: 3.2 mmol/L — ABNORMAL LOW (ref 3.5–5.1)
Sodium: 137 mmol/L (ref 135–145)

## 2024-04-19 LAB — CBC
HCT: 27.8 % — ABNORMAL LOW (ref 36.0–46.0)
Hemoglobin: 9.9 g/dL — ABNORMAL LOW (ref 12.0–15.0)
MCH: 32.1 pg (ref 26.0–34.0)
MCHC: 35.6 g/dL (ref 30.0–36.0)
MCV: 90.3 fL (ref 80.0–100.0)
Platelets: 281 10*3/uL (ref 150–400)
RBC: 3.08 MIL/uL — ABNORMAL LOW (ref 3.87–5.11)
RDW: 19.4 % — ABNORMAL HIGH (ref 11.5–15.5)
WBC: 8.9 10*3/uL (ref 4.0–10.5)
nRBC: 0.8 % — ABNORMAL HIGH (ref 0.0–0.2)

## 2024-04-19 MED ORDER — POTASSIUM CHLORIDE CRYS ER 20 MEQ PO TBCR
40.0000 meq | EXTENDED_RELEASE_TABLET | Freq: Once | ORAL | Status: AC
  Administered 2024-04-19: 40 meq via ORAL
  Filled 2024-04-19: qty 2

## 2024-04-19 MED ORDER — MELOXICAM 15 MG PO TABS: 15.0000 mg | ORAL_TABLET | Freq: Every day | ORAL | 0 refills | Status: DC

## 2024-04-19 MED ORDER — CYCLOBENZAPRINE HCL 5 MG PO TABS: 5.0000 mg | ORAL_TABLET | Freq: Three times a day (TID) | ORAL | 0 refills | Status: DC | PRN

## 2024-04-19 NOTE — Progress Notes (Signed)
 Late entry: Received inbound call from lab services @ 1145 stating they were unable to process lab ordered by ID. Per lab, the specimen was received @ 1115 despite collection time noted @ 0553. Notified prescribing MD and charge nurse. Unable to time note for accurate time of call.

## 2024-04-19 NOTE — Plan of Care (Signed)

## 2024-04-19 NOTE — Discharge Summary (Signed)
 Physician Discharge Summary   Patient: Tami Martin MRN: [id number] DOB: 02/15/1971  Admit date:     04/14/2024  Discharge date: 04/19/24  Discharge Physician: Ephriam Hashimoto   PCP: Adelia Homestead, MD     Recommendations at discharge:  Follow up with PCP Dr. Nicolette Barrio in 1 week Dr. Nicolette Barrio: Please follow up 5/15 Parasite exam, Blood     Discharge Diagnoses: Principal Problem:   Sepsis due to malaria Active Problems:   Thrombocytopenia (HCC)   Normocytic anemia      Hospital Course: 53 y.o. F with HTN, presented with N/V, diarrhea, headache, hypotension and headache.  Found in the ER to have fever, thrombocytopenia.  Admitted for fever in returning traveler.  ID consulted.  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     * Sepsis due to malaria Admitted and treated with Coartem  with ID consultation.  Given parasitemia 0.1% only, ID recommended three day course Coartem .  Fever resolved.  Mild residual HA but no symptoms of cerebritis.  Mentating well, oriented, no seizures, eating well, symptoms controlled with acetaminophen .  Discharged home after completing Coartem .  Recommend PCP follow up.                 The [place]  Controlled Substances Registry was reviewed for this patient prior to discharge.  Consultants: ID Procedures performed: CT head  Disposition: Home Diet recommendation:  Regular diet  DISCHARGE MEDICATION: Allergies as of 04/19/2024   No Known Allergies      Medication List     STOP taking these medications    pantoprazole  40 MG tablet Commonly known as: PROTONIX        TAKE these medications    acetaminophen  500 MG tablet Commonly known as: TYLENOL  Take 500 mg by mouth every 6 (six) hours as needed for moderate pain (pain score 4-6).   cyclobenzaprine  5 MG tablet Commonly known as: FLEXERIL  Take 1 tablet (5 mg total) by mouth 3 (three) times daily as needed for muscle spasms.    meloxicam  15 MG tablet Commonly known as: MOBIC  Take 1 tablet (15 mg total) by mouth daily.        Follow-up Information     Adelia Homestead, MD. Schedule an appointment as soon as possible for a visit in 1 week(s).   Specialty: Internal Medicine Contact information: [address] [place] [address] [phone]                 Discharge Instructions     Discharge instructions   Complete by: As directed    **IMPORTANT DISCHARGE INSTRUCTIONS**   From Dr. Darlyn Eke: You were admitted for malaria  You were treated here with Coartem  and improved  Your parasite count in your blood level was low, and so you have completed the standard 3 day course of Coartem   Go see Dr. Nicolette Barrio in 1 week She should review your tests from the hospital and call Dr. Zelda Hickman if needed  For your headache, the CT of your head was very normal. This is likely from the malaria.    For any aches or pains that still happen, take aceteminophen, cyclobenzaprine  or meloxicam /Mobic  as needed  I have sent refills of the meloxicam  or cyclobenzaprine  to your pharmacy, if you would like them.  If you are not having pain, you do not have to pick these up.   Increase activity slowly   Complete by: As directed        Discharge Exam: Filed  Weights   04/14/24 1145 04/15/24 1550  Weight: 81.6 kg 84.9 kg    General: Pt is alert, awake, not in acute distress Cardiovascular: RRR, nl S1-S2, no murmurs appreciated.   No LE edema.   Respiratory: Normal respiratory rate and rhythm.  CTAB without rales or wheezes. Abdominal: Abdomen soft and non-tender.  No distension or HSM.   Neuro/Psych: Strength symmetric in upper and lower extremities and 5/5.  FTN normal.  Judgment and insight appear normal. Carnial nerves normal   Condition at discharge: good  The results of significant diagnostics from this hospitalization (including imaging, microbiology, ancillary and laboratory) are listed below  for reference.   Imaging Studies: CT HEAD WO CONTRAST ( ) Result Date: 04/19/2024 CLINICAL DATA:  53 year old female with headache and fever. EXAM: CT HEAD WITHOUT CONTRAST TECHNIQUE: Contiguous axial images were obtained from the base of the skull through the vertex without intravenous contrast. RADIATION DOSE REDUCTION: This exam was performed according to the departmental dose-optimization program which includes automated exposure control, adjustment of the mA and/or kV according to patient size and/or use of iterative reconstruction technique. COMPARISON:  Head CT 11/09/2023. FINDINGS: Brain: Cerebral volume is stable since last year, normal for age. No midline shift, ventriculomegaly, mass effect, evidence of mass lesion, intracranial hemorrhage or evidence of cortically based acute infarction. Gray-white matter differentiation is within normal limits throughout the brain. Vascular: No suspicious intracranial vascular hyperdensity. Skull: Intact.  No acute osseous abnormality identified. Sinuses/Orbits: Mild left ethmoid sinus mucosal thickening is new. Other visualized paranasal sinuses and mastoids are stable and well aerated. Other: Visualized orbits and scalp soft tissues are within normal limits. IMPRESSION: 1. Stable and normal noncontrast CT appearance of the brain. 2. Mild left ethmoid sinus inflammation is new since December. Electronically Signed   By: Marlise Simpers M.D.   On: 04/19/2024 10:03   CT ABDOMEN PELVIS W CONTRAST Result Date: 04/14/2024 CLINICAL DATA:  Abdominal pain, acute, nonlocalized Patient reports nausea, vomiting, diarrhea and headache since falling 3 days ago. Fever. EXAM: CT ABDOMEN AND PELVIS WITH CONTRAST TECHNIQUE: Multidetector CT imaging of the abdomen and pelvis was performed using the standard protocol following bolus administration of intravenous contrast. RADIATION DOSE REDUCTION: This exam was performed according to the departmental dose-optimization program which  includes automated exposure control, adjustment of the mA and/or kV according to patient size and/or use of iterative reconstruction technique. CONTRAST:  100mL OMNIPAQUE  IOHEXOL  300 MG/ML  SOLN COMPARISON:  Same date chest radiographs. Abdominopelvic CT 05/09/2013 FINDINGS: Technical note: Despite efforts by the technologist and patient, mild motion artifact is present on today's exam and could not be eliminated. This reduces exam sensitivity and specificity. Lower chest: Small subpleural nodules anteriorly in the right middle lobe measuring up to 4 mm on image 11/5, not previously imaged, but likely benign. Mild atelectasis or scarring at both lung bases. No significant pleural or pericardial effusion. Hepatobiliary: The liver is normal in density without suspicious focal abnormality. No evidence of gallstones, gallbladder wall thickening or biliary dilatation. Pancreas: Unremarkable. No pancreatic ductal dilatation or surrounding inflammatory changes. Spleen: Normal in size without focal abnormality. Adrenals/Urinary Tract: Both adrenal glands appear normal. No evidence of urinary tract calculus, suspicious renal lesion or hydronephrosis. The bladder appears unremarkable for its degree of distention. Stomach/Bowel: No enteric contrast administered. The stomach appears unremarkable for its degree of distension. No evidence of bowel wall thickening, distention or surrounding inflammatory change. The appendix is not optimally visualized due to breathing artifact, although appears grossly unremarkable. No  pericecal inflammatory changes are identified. Vascular/Lymphatic: There are no enlarged abdominal or pelvic lymph nodes. No acute vascular findings. Variant anatomy of the celiac trunk. Reproductive: The uterus appears unremarkable. Small collapsing left ovarian follicle. No suspicious adnexal findings. Small amount of free pelvic fluid, within physiologic limits. Other: Intact abdominal wall. Small amount of free  pelvic fluid. No pneumoperitoneum. Musculoskeletal: No acute or significant osseous findings. Multilevel lumbar spondylosis with moderate asymmetric left foraminal narrowing at L5-S1. IMPRESSION: 1. No acute findings or explanation for the patient's symptoms. 2. Small collapsing left ovarian follicle with small amount of free pelvic fluid, within physiologic limits. 3. Small subpleural nodules in the right middle lobe, not previously imaged, but likely benign. Per Fleischner Society guidelines, no routine follow-up imaging is recommended. Electronically Signed   By: Elmon Hagedorn M.D.   On: 04/14/2024 19:33   DG Chest Port 1 View Result Date: 04/14/2024 CLINICAL DATA:  Sepsis EXAM: PORTABLE CHEST 1 VIEW COMPARISON:  March 08, 2022 FINDINGS: The heart size and mediastinal contours are within normal limits. Both lungs are clear. The visualized skeletal structures are unremarkable. IMPRESSION: No active disease. Electronically Signed   By: Fredrich Jefferson M.D.   On: 04/14/2024 15:07    Microbiology: Results for orders placed or performed during the hospital encounter of 04/14/24  Culture, blood (routine x 2)     Status: Abnormal   Collection Time: 04/14/24  1:15 PM   Specimen: BLOOD RIGHT ARM  Result Value Ref Range Status   Specimen Description   Final    BLOOD RIGHT ARM Performed at [facility], [address] [place], [place] [address]    Special Requests   Final    BOTTLES DRAWN AEROBIC ONLY Blood Culture results may not be optimal due to an inadequate volume of blood received in culture bottles Performed at [facility], [address] [place], [place] [address]    Culture  Setup Time   Final    GRAM POSITIVE RODS AEROBIC BOTTLE ONLY CRITICAL RESULT CALLED TO, READ BACK BY AND VERIFIED WITH: M LILLISTON,[email or web address]  04/15/24 MK    Culture (A)  Final    BACILLUS SPECIES Standardized susceptibility testing for this organism is not available. Performed at [facility], [address] [place], [place] [address]    Report Status 04/17/2024 FINAL  Final  Resp panel by RT-PCR (RSV, Flu A&B, Covid) Anterior Nasal Swab     Status: None   Collection Time: 04/14/24  1:52 PM   Specimen: Anterior Nasal Swab  Result Value Ref Range Status   SARS Coronavirus 2 by RT PCR NEGATIVE NEGATIVE Final    Comment: (NOTE) SARS-CoV-2 target nucleic acids are NOT DETECTED.  The SARS-CoV-2 RNA is generally detectable in upper respiratory specimens during the acute phase of infection. The lowest concentration of SARS-CoV-2 viral copies this assay can detect is 138 copies/mL. A negative result does not preclude SARS-Cov-2 infection and should not be used as the sole basis for treatment or other patient management decisions. A negative result may occur with  improper specimen collection/handling, submission of specimen other than nasopharyngeal swab, presence of viral mutation(s) within the areas targeted by this assay, and inadequate number of viral copies(<138 copies/mL). A negative result must be combined with clinical observations, patient history, and epidemiological information. The expected result is Negative.  Fact Sheet for Patients:  BloggerCourse.com  Fact Sheet for Healthcare Providers:  SeriousBroker.it  This test is no t yet approved or cleared  by the [place]  FDA and  has been authorized for detection and/or diagnosis of SARS-CoV-2 by FDA under an Emergency Use Authorization (EUA). This EUA will remain  in effect (meaning this test can be used) for the duration of the COVID-19 declaration under Section 564(b)(1) of the Act, 21 U.S.C.section 360bbb-3(b)(1), unless the authorization is terminated  or revoked sooner.       Influenza A by PCR NEGATIVE NEGATIVE Final   Influenza B by PCR NEGATIVE NEGATIVE Final    Comment: (NOTE) The Xpert Xpress SARS-CoV-2/FLU/RSV plus assay is intended  as an aid in the diagnosis of influenza from Nasopharyngeal swab specimens and should not be used as a sole basis for treatment. Nasal washings and aspirates are unacceptable for Xpert Xpress SARS-CoV-2/FLU/RSV testing.  Fact Sheet for Patients: BloggerCourse.com  Fact Sheet for Healthcare Providers: SeriousBroker.it  This test is not yet approved or cleared by the [place]  FDA and has been authorized for detection and/or diagnosis of SARS-CoV-2 by FDA under an Emergency Use Authorization (EUA). This EUA will remain in effect (meaning this test can be used) for the duration of the COVID-19 declaration under Section 564(b)(1) of the Act, 21 U.S.C. section 360bbb-3(b)(1), unless the authorization is terminated or revoked.     Resp Syncytial Virus by PCR NEGATIVE NEGATIVE Final    Comment: (NOTE) Fact Sheet for Patients: BloggerCourse.com  Fact Sheet for Healthcare Providers: SeriousBroker.it  This test is not yet approved or cleared by the [place]  FDA and has been authorized for detection and/or diagnosis of SARS-CoV-2 by FDA under an Emergency Use Authorization (EUA). This EUA will remain in effect (meaning this test can be used) for the duration of the COVID-19 declaration under Section 564(b)(1) of the Act, 21 U.S.C. section 360bbb-3(b)(1), unless the authorization is terminated or revoked.  Performed at [facility], [address], [place], [place] [address]   Gastrointestinal Panel by PCR , Stool     Status: None   Collection Time: 04/15/24 10:57 PM   Specimen: STOOL  Result Value Ref Range Status   Campylobacter species NOT DETECTED NOT DETECTED Final   Plesimonas shigelloides NOT DETECTED NOT DETECTED Final   Salmonella species NOT DETECTED NOT DETECTED Final   Yersinia enterocolitica NOT DETECTED NOT DETECTED Final   Vibrio species NOT  DETECTED NOT DETECTED Final   Vibrio cholerae NOT DETECTED NOT DETECTED Final   Enteroaggregative E coli (EAEC) NOT DETECTED NOT DETECTED Final   Enteropathogenic E coli (EPEC) NOT DETECTED NOT DETECTED Final   Enterotoxigenic E coli (ETEC) NOT DETECTED NOT DETECTED Final   Shiga like toxin producing E coli (STEC) NOT DETECTED NOT DETECTED Final   Shigella/Enteroinvasive E coli (EIEC) NOT DETECTED NOT DETECTED Final   Cryptosporidium NOT DETECTED NOT DETECTED Final   Cyclospora cayetanensis NOT DETECTED NOT DETECTED Final   Entamoeba histolytica NOT DETECTED NOT DETECTED Final   Giardia lamblia NOT DETECTED NOT DETECTED Final   Adenovirus F40/41 NOT DETECTED NOT DETECTED Final   Astrovirus NOT DETECTED NOT DETECTED Final   Norovirus GI/GII NOT DETECTED NOT DETECTED Final   Rotavirus A NOT DETECTED NOT DETECTED Final   Sapovirus (I, II, IV, and V) NOT DETECTED NOT DETECTED Final    Comment: Performed at [facility], [address] [place], [place] [address]  C Difficile Quick Screen w PCR reflex     Status: None   Collection Time: 04/15/24 10:57 PM   Specimen: STOOL  Result Value Ref Range Status  C Diff antigen NEGATIVE NEGATIVE Final   C Diff toxin NEGATIVE NEGATIVE Final   C Diff interpretation No C. difficile detected.  Final    Comment: Performed at [facility], [address] [place], [place] [address]  Culture, blood (Routine X 2) w Reflex to ID Panel     Status: None (Preliminary result)   Collection Time: 04/17/24  1:38 PM   Specimen: BLOOD RIGHT HAND  Result Value Ref Range Status   Specimen Description   Final    BLOOD RIGHT HAND Performed at [facility], [address] [place], [place] [address]    Special Requests   Final    BOTTLES DRAWN AEROBIC ONLY Blood Culture results may not be optimal due to an inadequate volume of blood received in culture bottles Performed at [facility], [address]  [place], [place] [address]    Culture   Final    NO GROWTH 2 DAYS Performed at [facility], [address] [place], [place] [address]    Report Status PENDING  Incomplete  Culture, blood (Routine X 2) w Reflex to ID Panel     Status: None (Preliminary result)   Collection Time: 04/17/24  1:47 PM   Specimen: BLOOD RIGHT HAND  Result Value Ref Range Status   Specimen Description   Final    BLOOD RIGHT HAND Performed at [facility], [address], [place], [place] [address]    Special Requests   Final    BOTTLES DRAWN AEROBIC ONLY Blood Culture results may not be optimal due to an inadequate volume of blood received in culture bottles Performed at [facility], [address], [place], [place] [address]    Culture   Final    NO GROWTH 2 DAYS Performed at [facility], [address], [place], [place] [address]    Report Status PENDING  Incomplete    Labs: CBC: Recent Labs  Lab 04/14/24 1325 04/14/24 1751 04/15/24 0626 04/16/24 0533 04/16/24 1443 04/17/24 0538 04/19/24 0621  WBC 6.1  --  5.0 5.0 6.6 7.7 8.9  NEUTROABS 3.8  --   --   --  4.3  --   --   HGB 14.7   < > 11.2* 10.1* 10.0* 10.6* 9.9*  HCT 44.1   < > 33.7* 30.2* 29.3* 31.8* 27.8*  MCV 84.0  --  84.9 85.6 81.8 86.4 90.3  PLT 66*  --  52* 86* 105* 139* 281   < > = values in this interval not displayed.   Basic Metabolic Panel: Recent Labs  Lab 04/14/24 1753 04/15/24 0446 04/16/24 0533 04/17/24 0538 04/19/24 0621  NA 133* 135 135 134* 137  K 3.7 3.4* 3.4* 3.5 3.2*  CL 106 108 108 106 104  CO2 18* 21* 22 24 27   GLUCOSE 91 104* 98 118* 113*  BUN 13 11 8 6  <5*  CREATININE 0.54 0.51 0.56 0.50 0.53  CALCIUM 7.0* 7.4* 7.9* 7.8* 8.2*   Liver Function Tests: Recent Labs  Lab 04/14/24 1753 04/15/24 0446 04/16/24 0533 04/17/24 0538  AST 29 18 17 20   ALT 17 12 13 12   ALKPHOS 45 38 39 45  BILITOT 1.4* 1.3* 1.2 1.2  PROT 5.4* 5.0* 5.3* 6.2*  ALBUMIN  2.2* 2.2* 2.2* 2.4*    CBG: Recent Labs  Lab 04/14/24 1141  GLUCAP 111*    Discharge time spent: approximately 45 minutes spent on discharge counseling, evaluation  of patient on day of discharge, and coordination of discharge planning with nursing, social work, pharmacy and case management  Signed: Ephriam Hashimoto, MD [facility] 04/19/2024

## 2024-04-21 ENCOUNTER — Telehealth: Payer: Self-pay | Admitting: *Deleted

## 2024-04-21 LAB — PARASITE EXAM, BLOOD: Parasite Exam, Blood: POSITIVE — AB

## 2024-04-21 NOTE — Transitions of Care (Post Inpatient/ED Visit) (Signed)
   04/21/2024  Name: Tami Martin MRN: [id number] DOB: 1971-06-11  Today's TOC FU Call Status: Today's TOC FU Call Status:: Unsuccessful Call (1st Attempt) Unsuccessful Call (1st Attempt) Date: 04/21/24  Attempted to reach the patient regarding the most recent Inpatient visit.  Left HIPAA compliant voice message requesting call back  Follow Up Plan: Additional outreach attempts will be made to reach the patient to complete the Transitions of Care (Post Inpatient visit) call.   Pls call/ message for questions,  Magdaleno Lortie Mckinney Khalid Lacko, RN, BSN, CCRN Alumnus RN Care Manager  Transitions of Care  [facility] - [facility] [phone]: direct office

## 2024-04-22 ENCOUNTER — Telehealth: Payer: Self-pay | Admitting: *Deleted

## 2024-04-22 LAB — CULTURE, BLOOD (ROUTINE X 2)
Culture: NO GROWTH
Culture: NO GROWTH

## 2024-04-22 NOTE — Transitions of Care (Post Inpatient/ED Visit) (Signed)
 04/22/2024  Name: Tami Martin MRN: [id number] DOB: 19-Jul-1971  Today's TOC FU Call Status: Today's TOC FU Call Status:: Successful TOC FU Call Completed TOC FU Call Complete Date: 04/22/24 Patient's Name and Date of Birth confirmed.  Transition Care Management Follow-up Telephone Call Date of Discharge: 04/19/24 Discharge Facility: [facility]) Type of Discharge: Inpatient Admission Primary Inpatient Discharge Diagnosis:: Sepsis secondary to malaria after travel; N/V/Diarrhea; thrombocytopenia How have you been since you were released from the hospital?: Better ("I am doing better, I don't know if I feel like I can drive after being sick; will call my insurance company to get a ride.  Thank you for getting this appointment scheduled for me") Any questions or concerns?: No  Items Reviewed: Did you receive and understand the discharge instructions provided?: Yes Medications obtained,verified, and reconciled?: Yes (Medications Reviewed) (Full medication reconciliation/ review completed; no concerns or discrepancies identified; confirmed patient obtained/ is taking all newly Rx'd medications as instructed; self-manages medications and denies questions/ concerns around medications today) Medications Not Reviewed Reasons::  (patient unable to concentrate to review medications) Any new allergies since your discharge?: No Dietary orders reviewed?: Yes Type of Diet Ordered:: "Regular" Do you have support at home?: Yes People in Home [RPT]: child(ren), adult Name of Support/Comfort Primary Source: Reports independent in self-care activities; resides with supportive adult son- assists as/ if needed/ indicated  Medications Reviewed Today: Medications Reviewed Today     Reviewed by Ilayda Toda M, RN (Registered Nurse) on 04/22/24 at 1017  Med List Status: <None>   Medication Order Taking? Sig Documenting Provider Last Dose Status Informant  acetaminophen  (TYLENOL ) 500 MG tablet  [id number] Yes Take 500 mg by mouth every 6 (six) hours as needed for moderate pain (pain score 4-6). [provider] Taking Active Self  cyclobenzaprine  (FLEXERIL ) 5 MG tablet [id number] Yes Take 1 tablet (5 mg total) by mouth 3 (three) times daily as needed for muscle spasms. Ephriam Hashimoto, MD Taking Active   meloxicam  (MOBIC ) 15 MG tablet [id number] Yes Take 1 tablet (15 mg total) by mouth daily. Ephriam Hashimoto, MD Taking Active            Home Care and Equipment/Supplies: Were Home Health Services Ordered?: No Any new equipment or medical supplies ordered?: No  Functional Questionnaire: Do you need assistance with bathing/showering or dressing?: No Do you need assistance with meal preparation?: No Do you need assistance with eating?: No Do you have difficulty maintaining continence: No Do you need assistance with getting out of bed/getting out of a chair/moving?: No Do you have difficulty managing or taking your medications?: No  Follow up appointments reviewed: PCP Follow-up appointment confirmed?: Yes (care coordination outreach in real-time with scheduling care guide to successfully schedule hospital follow up PCP appointment 04/25/24) Date of PCP follow-up appointment?: 04/25/24 Follow-up Provider: PCP Specialist Hospital Follow-up appointment confirmed?: Yes Date of Specialist follow-up appointment?: 05/13/24 Follow-Up Specialty Provider:: ID provider Do you need transportation to your follow-up appointment?: No Do you understand care options if your condition(s) worsen?: Yes-patient verbalized understanding  SDOH Interventions Today    Flowsheet Row Most Recent Value  SDOH Interventions   Food Insecurity Interventions Intervention Not Indicated  Housing Interventions Intervention Not Indicated  Transportation Interventions Intervention Not Indicated  [confirms drives self,  unsure she will feel "up to" driving to doctor appointments: provided  education/ instruction re: how to contact insurance provider to obtain transportation]  Utilities Interventions Intervention Not Indicated  See TOC assessment tabs for additional assessment/ TOC intervention information  Pls call/ message for questions,  Jalonda Antigua Mckinney Anayi Bricco, RN, BSN, CCRN Alumnus RN Care Manager  Transitions of Care  [facility] - [facility] [phone]: direct office

## 2024-04-25 ENCOUNTER — Ambulatory Visit (INDEPENDENT_AMBULATORY_CARE_PROVIDER_SITE_OTHER): Admitting: Family Medicine

## 2024-04-25 ENCOUNTER — Encounter: Payer: Self-pay | Admitting: Family Medicine

## 2024-04-25 VITALS — BP 124/72 | HR 85 | Temp 97.9°F | Ht 66.0 in | Wt 185.0 lb

## 2024-04-25 DIAGNOSIS — R531 Weakness: Secondary | ICD-10-CM | POA: Diagnosis not present

## 2024-04-25 DIAGNOSIS — D649 Anemia, unspecified: Secondary | ICD-10-CM | POA: Diagnosis not present

## 2024-04-25 DIAGNOSIS — B54 Unspecified malaria: Secondary | ICD-10-CM

## 2024-04-25 DIAGNOSIS — Z8619 Personal history of other infectious and parasitic diseases: Secondary | ICD-10-CM | POA: Diagnosis not present

## 2024-04-25 DIAGNOSIS — R42 Dizziness and giddiness: Secondary | ICD-10-CM | POA: Diagnosis not present

## 2024-04-25 DIAGNOSIS — A419 Sepsis, unspecified organism: Secondary | ICD-10-CM

## 2024-04-25 LAB — COMPREHENSIVE METABOLIC PANEL WITH GFR
ALT: 15 U/L (ref 0–35)
AST: 19 U/L (ref 0–37)
Albumin: 3.6 g/dL (ref 3.5–5.2)
Alkaline Phosphatase: 62 U/L (ref 39–117)
BUN: 13 mg/dL (ref 6–23)
CO2: 24 meq/L (ref 19–32)
Calcium: 9 mg/dL (ref 8.4–10.5)
Chloride: 107 meq/L (ref 96–112)
Creatinine, Ser: 0.56 mg/dL (ref 0.40–1.20)
GFR: 104.22 mL/min (ref >60.00)
Glucose, Bld: 113 mg/dL — ABNORMAL HIGH (ref 70–99)
Potassium: 4 meq/L (ref 3.5–5.1)
Sodium: 137 meq/L (ref 135–145)
Total Bilirubin: 0.5 mg/dL (ref 0.2–1.2)
Total Protein: 7.8 g/dL (ref 6.0–8.3)

## 2024-04-25 LAB — CBC WITH DIFFERENTIAL/PLATELET
Basophils Absolute: 0.1 10*3/uL (ref 0.0–0.1)
Basophils Relative: 1.3 % (ref 0.0–3.0)
Eosinophils Absolute: 0.1 10*3/uL (ref 0.0–0.7)
Eosinophils Relative: 1.8 % (ref 0.0–5.0)
HCT: 37.3 % (ref 36.0–46.0)
Hemoglobin: 12.5 g/dL (ref 12.0–15.0)
Lymphocytes Relative: 50.2 % — ABNORMAL HIGH (ref 12.0–46.0)
Lymphs Abs: 2.7 10*3/uL (ref 0.7–4.0)
MCHC: 33.4 g/dL (ref 30.0–36.0)
MCV: 85.3 fl (ref 78.0–100.0)
Monocytes Absolute: 0.4 10*3/uL (ref 0.1–1.0)
Monocytes Relative: 8 % (ref 3.0–12.0)
Neutro Abs: 2.1 10*3/uL (ref 1.4–7.7)
Neutrophils Relative %: 38.7 % — ABNORMAL LOW (ref 43.0–77.0)
Platelets: 542 10*3/uL — ABNORMAL HIGH (ref 150.0–400.0)
RBC: 4.37 Mil/uL (ref 3.87–5.11)
RDW: 17.4 % — ABNORMAL HIGH (ref 11.5–15.5)
WBC: 5.5 10*3/uL (ref 4.0–10.5)

## 2024-04-25 LAB — PLASMODIUM SP. REFLEX: P. vivax: NEGATIVE

## 2024-04-25 LAB — PLASMODIUM SP. PCR: Plasmodium Sp. PCR: POSITIVE — AB

## 2024-04-25 NOTE — Progress Notes (Unsigned)
 Subjective:     Patient ID: Tami Martin, female    DOB: 07-17-1971, 53 y.o.   MRN: [id number]  Chief Complaint  Patient presents with   Hospitalization Follow-up    d/c 5/17 due to sepsis w malaria. States doing better but mouth still dry and having headaches. Hospital told her sxs will take time to go away bc she is still not back to baseline  Bottom itching    HPI   History of Present Illness         She is here for a hospital discharge follow up. Hospitalized for sepsis secondary to gastroenteritis later found out to be due to Malaria. She was treated with Coartem  for 3 days due to only 0.1% parasitemia and followed by ID.   She was sent to the ED via EMS from PCP office due to N/V/D, hypotension and tachycardia    Admit date:     04/14/2024  Discharge date: 04/19/24     She is from [place] and was visiting her country   Today reports improvement in symptoms. No longer having N/V/D.  Feels hydrated.  No significant abdominal pain.    Health Maintenance Due  Topic Date Due   Hepatitis C Screening  Never done   Cervical Cancer Screening (HPV/Pap Cotest)  04/02/2016   Zoster Vaccines- Shingrix (1 of 2) Never done   COVID-19 Vaccine (4 - 2024-25 season) 08/05/2023    Past Medical History:  Diagnosis Date   Allergy    Seasonal allergies    DDD (degenerative disc disease), lumbosacral 2011   GERD (gastroesophageal reflux disease)    denies today 07/2016   Prediabetes 2014    Past Surgical History:  Procedure Laterality Date   KNEE SURGERY Right 2020    Family History  Problem Relation Age of Onset   Breast cancer Neg Hx     Social History   Socioeconomic History   Marital status: Legally Separated    Spouse name: Not on file   Number of children: 7   Years of education: 8   Highest education level: Not on file  Occupational History   Occupation: HOUSEKEEPING    Employer: [employer]   Tobacco Use   Smoking status:  Never   Smokeless tobacco: Never  Substance and Sexual Activity   Alcohol use: Yes    Comment: once month-wine   Drug use: No   Sexual activity: Yes    Birth control/protection: Injection    Comment: Depo Provera every 3 months at [facility]  Other Topics Concern   Not on file  Social History Narrative   Originally from [place]   Came to [place]. In 2010   Lives at home with her 5 younger children.     Her 2 older children live by themselves here in [place]   No support from her estranged husband   Social Drivers of Corporate investment banker Strain: Not on file  Food Insecurity: No Food Insecurity (04/22/2024)   Hunger Vital Sign    Worried About Running Out of Food in the Last Year: Never true    Ran Out of Food in the Last Year: Never true  Transportation Needs: No Transportation Needs (04/22/2024)   PRAPARE - Administrator, Civil Service (Medical): No    Lack of Transportation (Non-Medical): No  Physical Activity: Not on file  Stress: Not on file  Social Connections: Not on file  Intimate Partner Violence: Patient  Declined (04/22/2024)   Humiliation, Afraid, Rape, and Kick questionnaire    Fear of Current or Ex-Partner: Patient declined    Emotionally Abused: Patient declined    Physically Abused: Patient declined    Sexually Abused: Patient declined    Outpatient Medications Prior to Visit  Medication Sig Dispense Refill   acetaminophen  (TYLENOL ) 500 MG tablet Take 500 mg by mouth every 6 (six) hours as needed for moderate pain (pain score 4-6).     cyclobenzaprine  (FLEXERIL ) 5 MG tablet Take 1 tablet (5 mg total) by mouth 3 (three) times daily as needed for muscle spasms. 30 tablet 0   meloxicam  (MOBIC ) 15 MG tablet Take 1 tablet (15 mg total) by mouth daily. 30 tablet 0   No facility-administered medications prior to visit.    No Known Allergies  Review of Systems  Constitutional:  Positive for malaise/fatigue. Negative for chills and  fever.  Eyes:  Negative for blurred vision, double vision and photophobia.  Respiratory:  Negative for shortness of breath.   Cardiovascular:  Negative for chest pain, palpitations and leg swelling.  Gastrointestinal:  Positive for heartburn. Negative for abdominal pain, blood in stool, constipation, diarrhea, nausea and vomiting.  Genitourinary:  Negative for dysuria, frequency and urgency.  Musculoskeletal:  Positive for joint pain and myalgias.  Skin:  Negative for rash.  Neurological:  Positive for dizziness and headaches. Negative for tingling, focal weakness and loss of consciousness.  Endo/Heme/Allergies:  Does not bruise/bleed easily.       Objective:     Physical Exam Constitutional:      General: She is not in acute distress.    Appearance: She is not ill-appearing.  HENT:     Nose: Nose normal.     Mouth/Throat:     Mouth: Mucous membranes are moist.  Eyes:     Extraocular Movements: Extraocular movements intact.     Conjunctiva/sclera: Conjunctivae normal.  Cardiovascular:     Rate and Rhythm: Normal rate and regular rhythm.  Pulmonary:     Effort: Pulmonary effort is normal.     Breath sounds: Normal breath sounds.  Abdominal:     General: There is no distension.     Palpations: Abdomen is soft.     Tenderness: There is no abdominal tenderness. There is no guarding or rebound.  Musculoskeletal:        General: Normal range of motion.     Cervical back: Normal range of motion and neck supple. No tenderness.     Right lower leg: No edema.     Left lower leg: No edema.  Lymphadenopathy:     Cervical: No cervical adenopathy.  Skin:    General: Skin is warm and dry.     Findings: No rash.  Neurological:     General: No focal deficit present.     Mental Status: She is alert and oriented to person, place, and time.     Cranial Nerves: No cranial nerve deficit.     Motor: No weakness.     Coordination: Coordination normal.     Gait: Gait normal.  Psychiatric:         Mood and Affect: Mood normal.        Behavior: Behavior normal.        Thought Content: Thought content normal.      BP 124/72 (BP Location: Left Arm, Patient Position: Sitting)   Pulse 85   Temp 97.9 F (36.6 C) (Temporal)   Ht 5\' 6"  (1.676 m)  Wt 185 lb (83.9 kg)   SpO2 98%   BMI 29.86 kg/m  Wt Readings from Last 3 Encounters:  04/25/24 185 lb (83.9 kg)  04/15/24 187 lb 2.7 oz (84.9 kg)  04/14/24 187 lb 3.2 oz (84.9 kg)       Assessment & Plan:   Problem List Items Addressed This Visit     Dizziness   Relevant Orders   CBC with Differential/Platelet (Completed)   Generalized weakness   Sepsis due to malaria - Primary   Relevant Orders   Parasite Exam, Blood   CBC with Differential/Platelet (Completed)   Comprehensive metabolic panel with GFR (Completed)   Other Visit Diagnoses       Anemia, unspecified type       Relevant Orders   CBC with Differential/Platelet (Completed)     Malaria       Relevant Orders   Parasite Exam, Blood      Reviewed hospital discharge summary, results and recommendations.  Medications reconciled.  Anemia in setting of malaria.  Recheck labs including recommended parasite exam.  Consulted with Dr. Nicolette Barrio, patient's PCP.  Follow up pending results. She will follow up with ID as scheduled.   I am having Raelan F. Zwilling maintain her acetaminophen , meloxicam , and cyclobenzaprine .  No orders of the defined types were placed in this encounter.

## 2024-04-28 DIAGNOSIS — B54 Unspecified malaria: Secondary | ICD-10-CM | POA: Insufficient documentation

## 2024-04-30 ENCOUNTER — Ambulatory Visit: Payer: Self-pay | Admitting: Family Medicine

## 2024-04-30 LAB — PARASITE EXAM, BLOOD

## 2024-04-30 NOTE — Progress Notes (Signed)
 Please let her know that her labs have improved and her test for malaria is negative.  She should see her infectious disease provider as scheduled in June and follow-up here if she has any recurrent fevers.

## 2024-04-30 NOTE — Progress Notes (Signed)
 Please advise. It seems she is improving. She will follow up with ID 05/13/2024. Thanks.

## 2024-05-13 ENCOUNTER — Encounter: Payer: Self-pay | Admitting: Internal Medicine

## 2024-05-13 ENCOUNTER — Ambulatory Visit (INDEPENDENT_AMBULATORY_CARE_PROVIDER_SITE_OTHER): Payer: Self-pay | Admitting: Internal Medicine

## 2024-05-13 ENCOUNTER — Other Ambulatory Visit: Payer: Self-pay

## 2024-05-13 VITALS — BP 132/91 | HR 85 | Temp 97.7°F | Wt 186.0 lb

## 2024-05-13 DIAGNOSIS — B54 Unspecified malaria: Secondary | ICD-10-CM | POA: Diagnosis present

## 2024-05-13 NOTE — Progress Notes (Signed)
 Patient: Tami Martin  DOB: June 17, 1971 MRN: [id number] PCP: Adelia Homestead, MD   Patient Active Problem List   Diagnosis Date Noted   Malaria 04/28/2024   Normocytic anemia 04/17/2024   Acute gastroenteritis 04/14/2024   Generalized weakness 04/14/2024   Dizziness 04/14/2024   Hypotension due to hypovolemia 04/14/2024   Family history of catecholaminergic polymorphic ventricular tachycardia 04/14/2024   Tachycardia 04/14/2024   Lumbar spondylosis 04/14/2024   Sepsis due to malaria 04/14/2024   Thrombocytopenia (HCC) 04/14/2024   History of sciatica 04/14/2024   Right lower quadrant abdominal pain 07/06/2022   Right lower quadrant abdominal tenderness with rebound tenderness 07/06/2022   Acute cystitis without hematuria 07/06/2022   Chronic bilateral low back pain with right-sided sciatica 03/09/2022   Hot flashes 09/16/2021   Acute sinusitis 08/15/2018   Pes planus 10/11/2017   Prediabetes 08/29/2016   GERD (gastroesophageal reflux disease) 08/29/2016   Acute intractable headache 01/22/2015   Encounter for general adult medical examination with abnormal findings 09/11/2014   Allergic rhinitis 06/10/2013   Sciatica neuralgia 03/04/2013     Subjective:  Tami Martin is a 53 y.o. female with past medical history of DDD, GERD, prediabetes presents for hospital follow-up of uncomplicated malaria.  She was treated Cortan  x 3 days.  Last parasite exam on 5/23 showed no Plasmodium.  On 5/23 anemia resolved with hemoglobin 12.5. PT states she feels ok. Some loss of apperite.  Review of Systems  All other systems reviewed and are negative.   Past Medical History:  Diagnosis Date   Allergy    Seasonal allergies    DDD (degenerative disc disease), lumbosacral 2011   GERD (gastroesophageal reflux disease)    denies today 07/2016   Prediabetes 2014    Outpatient Medications Prior to Visit  Medication Sig Dispense Refill   acetaminophen  (TYLENOL )  500 MG tablet Take 500 mg by mouth every 6 (six) hours as needed for moderate pain (pain score 4-6).     cyclobenzaprine  (FLEXERIL ) 5 MG tablet Take 1 tablet (5 mg total) by mouth 3 (three) times daily as needed for muscle spasms. 30 tablet 0   meloxicam  (MOBIC ) 15 MG tablet Take 1 tablet (15 mg total) by mouth daily. 30 tablet 0   No facility-administered medications prior to visit.     No Known Allergies  Social History   Tobacco Use   Smoking status: Never   Smokeless tobacco: Never  Substance Use Topics   Alcohol use: Yes    Comment: once month-wine   Drug use: No    Family History  Problem Relation Age of Onset   Breast cancer Neg Hx     Objective:  There were no vitals filed for this visit. There is no height or weight on file to calculate BMI.  Physical Exam Constitutional:      Appearance: Normal appearance.  HENT:     Head: Normocephalic and atraumatic.     Right Ear: Tympanic membrane normal.     Left Ear: Tympanic membrane normal.     Nose: Nose normal.     Mouth/Throat:     Mouth: Mucous membranes are moist.   Eyes:     Extraocular Movements: Extraocular movements intact.     Conjunctiva/sclera: Conjunctivae normal.     Pupils: Pupils are equal, round, and reactive to light.    Cardiovascular:     Rate and Rhythm: Normal rate and regular rhythm.     Heart sounds: No murmur heard.  No friction rub. No gallop.  Pulmonary:     Effort: Pulmonary effort is normal.     Breath sounds: Normal breath sounds.  Abdominal:     General: Abdomen is flat.     Palpations: Abdomen is soft.   Musculoskeletal:        General: Normal range of motion.   Skin:    General: Skin is warm and dry.   Neurological:     General: No focal deficit present.     Mental Status: She is alert and oriented to person, place, and time.   Psychiatric:        Mood and Affect: Mood normal.     Lab Results: Lab Results  Component Value Date   WBC 5.5 04/25/2024   HGB  12.5 04/25/2024   HCT 37.3 04/25/2024   MCV 85.3 04/25/2024   PLT 542.0 (H) 04/25/2024    Lab Results  Component Value Date   CREATININE 0.56 04/25/2024   BUN 13 04/25/2024   NA 137 04/25/2024   K 4.0 04/25/2024   CL 107 04/25/2024   CO2 24 04/25/2024    Lab Results  Component Value Date   ALT 15 04/25/2024   AST 19 04/25/2024   ALKPHOS 62 04/25/2024   BILITOT 0.5 04/25/2024     Assessment & Plan:   #malaria with P falciparum sp coartem  x 3 d -5/23 plasmodium exam  negative Deiscussed ppx that she needs to take malaria medicine when traveling home([place]) F/U PRN   #lose of appetitive She has some lose of appetite for a few weeks, gained about 5 lb in thelast month,F /u pcp.  Orlie Bjornstad, MD [facility]  I have personally spent 42 minutes involved in face-to-face and non-face-to-face activities for this patient on the day of the visit. Professional time spent includes the following activities: Preparing to see the patient (review of tests), Obtaining and/or reviewing separately obtained history (admission/discharge record), Performing a medically appropriate examination and/or evaluation , Ordering medications/tests/procedures, referring and communicating with other health care professionals, Documenting clinical information in the EMR, Independently interpreting results (not separately reported), Communicating results to the patient/family/caregiver, Counseling and educating the patient/family/caregiver and Care coordination (not separately reported).     05/13/24  9:12 AM

## 2024-05-27 ENCOUNTER — Encounter: Payer: Self-pay | Admitting: Internal Medicine

## 2024-05-27 ENCOUNTER — Ambulatory Visit: Admitting: Internal Medicine

## 2024-05-27 ENCOUNTER — Ambulatory Visit: Payer: Self-pay | Admitting: Internal Medicine

## 2024-05-27 VITALS — BP 120/84 | HR 79 | Temp 98.8°F | Ht 66.0 in | Wt 191.0 lb

## 2024-05-27 DIAGNOSIS — R630 Anorexia: Secondary | ICD-10-CM

## 2024-05-27 DIAGNOSIS — G44201 Tension-type headache, unspecified, intractable: Secondary | ICD-10-CM | POA: Diagnosis not present

## 2024-05-27 DIAGNOSIS — R7303 Prediabetes: Secondary | ICD-10-CM | POA: Diagnosis not present

## 2024-05-27 DIAGNOSIS — B372 Candidiasis of skin and nail: Secondary | ICD-10-CM | POA: Diagnosis not present

## 2024-05-27 DIAGNOSIS — D75839 Thrombocytosis, unspecified: Secondary | ICD-10-CM

## 2024-05-27 LAB — COMPREHENSIVE METABOLIC PANEL WITH GFR
ALT: 22 U/L (ref 0–35)
AST: 25 U/L (ref 0–37)
Albumin: 3.7 g/dL (ref 3.5–5.2)
Alkaline Phosphatase: 75 U/L (ref 39–117)
BUN: 10 mg/dL (ref 6–23)
CO2: 27 meq/L (ref 19–32)
Calcium: 9 mg/dL (ref 8.4–10.5)
Chloride: 105 meq/L (ref 96–112)
Creatinine, Ser: 0.61 mg/dL (ref 0.40–1.20)
GFR: 102.03 mL/min (ref >60.00)
Glucose, Bld: 131 mg/dL — ABNORMAL HIGH (ref 70–99)
Potassium: 4 meq/L (ref 3.5–5.1)
Sodium: 136 meq/L (ref 135–145)
Total Bilirubin: 0.5 mg/dL (ref 0.2–1.2)
Total Protein: 7.2 g/dL (ref 6.0–8.3)

## 2024-05-27 LAB — CBC WITH DIFFERENTIAL/PLATELET
Basophils Absolute: 0 10*3/uL (ref 0.0–0.1)
Basophils Relative: 0.5 % (ref 0.0–3.0)
Eosinophils Absolute: 0.2 10*3/uL (ref 0.0–0.7)
Eosinophils Relative: 4.7 % (ref 0.0–5.0)
HCT: 41.6 % (ref 36.0–46.0)
Hemoglobin: 13.8 g/dL (ref 12.0–15.0)
Lymphocytes Relative: 51.4 % — ABNORMAL HIGH (ref 12.0–46.0)
Lymphs Abs: 2.1 10*3/uL (ref 0.7–4.0)
MCHC: 33.1 g/dL (ref 30.0–36.0)
MCV: 85.9 fl (ref 78.0–100.0)
Monocytes Absolute: 0.3 10*3/uL (ref 0.1–1.0)
Monocytes Relative: 8.2 % (ref 3.0–12.0)
Neutro Abs: 1.4 10*3/uL (ref 1.4–7.7)
Neutrophils Relative %: 35.2 % — ABNORMAL LOW (ref 43.0–77.0)
Platelets: 267 10*3/uL (ref 150.0–400.0)
RBC: 4.85 Mil/uL (ref 3.87–5.11)
RDW: 13.9 % (ref 11.5–15.5)
WBC: 4.1 10*3/uL (ref 4.0–10.5)

## 2024-05-27 LAB — HEMOGLOBIN A1C: Hgb A1c MFr Bld: 5.1 % (ref 4.6–6.5)

## 2024-05-27 MED ORDER — FLUCONAZOLE 150 MG PO TABS: 150.0000 mg | ORAL_TABLET | Freq: Once | ORAL | 0 refills | Status: AC

## 2024-05-27 MED ORDER — MIRTAZAPINE 15 MG PO TABS: 15.0000 mg | ORAL_TABLET | Freq: Every day | ORAL | 0 refills | Status: DC

## 2024-05-27 NOTE — Patient Instructions (Signed)
 We have sent in diflucan  to take for the itching.  We have sent in mirtazepine to take in the evening to help the appetite.   We are checking the labs today.

## 2024-05-27 NOTE — Progress Notes (Unsigned)
   Subjective:   Patient ID: Tami Martin, female    DOB: [birth year], 53 y.o.   MRN: [id number]  HPI The patent is a 53 YO female coming in for several concerns including lack of appetite (since malaria still) and itching and some rash (started after antibiotics for malaria) and having headaches (was having them in the fall they improved some and now are back since malaria and daily she takes otc which does not always help).   Review of Systems  Constitutional:  Positive for activity change, appetite change and fatigue.  HENT: Negative.    Eyes: Negative.   Respiratory:  Negative for cough, chest tightness and shortness of breath.   Cardiovascular:  Negative for chest pain, palpitations and leg swelling.  Gastrointestinal:  Negative for abdominal distention, abdominal pain, constipation, diarrhea, nausea and vomiting.  Musculoskeletal: Negative.   Skin: Negative.   Neurological:  Positive for headaches.  Psychiatric/Behavioral: Negative.      Objective:  Physical Exam Constitutional:      Appearance: She is well-developed.  HENT:     Head: Normocephalic and atraumatic.   Cardiovascular:     Rate and Rhythm: Normal rate and regular rhythm.  Pulmonary:     Effort: Pulmonary effort is normal. No respiratory distress.     Breath sounds: Normal breath sounds. No wheezing or rales.  Abdominal:     General: Bowel sounds are normal. There is no distension.     Palpations: Abdomen is soft.     Tenderness: There is no abdominal tenderness. There is no rebound.   Musculoskeletal:     Cervical back: Normal range of motion.   Skin:    General: Skin is warm and dry.   Neurological:     Mental Status: She is alert and oriented to person, place, and time.     Coordination: Coordination normal.     Vitals:   05/27/24 0940 05/27/24 1004  BP: (!) 140/96 120/84  Pulse: 79   Temp: 98.8 F (37.1 C)   TempSrc: Oral   SpO2: 99%   Weight: 191 lb (86.6 kg)   Height: 5' 6  (1.676 m)     Assessment & Plan:

## 2024-05-29 ENCOUNTER — Encounter: Payer: Self-pay | Admitting: Internal Medicine

## 2024-05-29 DIAGNOSIS — D75839 Thrombocytosis, unspecified: Secondary | ICD-10-CM | POA: Insufficient documentation

## 2024-05-29 DIAGNOSIS — B372 Candidiasis of skin and nail: Secondary | ICD-10-CM | POA: Insufficient documentation

## 2024-05-29 DIAGNOSIS — R630 Anorexia: Secondary | ICD-10-CM | POA: Insufficient documentation

## 2024-05-29 NOTE — Assessment & Plan Note (Signed)
 Likely worse due to recent antibiotics. Rx diflucan  to help and she will let us  know if this does not improve.

## 2024-05-29 NOTE — Assessment & Plan Note (Signed)
 Checking HgA1c as due and checking labs. Adjust as needed.

## 2024-05-29 NOTE — Assessment & Plan Note (Signed)
 Checking CBC and if still present may need further assessment. Suspect related to malaria and treatment with poor appetite.

## 2024-05-29 NOTE — Assessment & Plan Note (Signed)
 She had resolution of this in the fall and recently has been worse. She did have mild sinus congestion on head imaging recently. Advised to start claritin or zyrtec  to see if this helps. She is using otc to help.

## 2024-05-29 NOTE — Assessment & Plan Note (Signed)
 Rx remeron 15 mg at bedtime to help with appetite for temporary. She is likely still recovering from malaria and expect within 1-2 months this would improve. If not we would need further assessment. Checking CBC and CMP for stability/improvement.

## 2024-06-09 ENCOUNTER — Other Ambulatory Visit: Payer: Self-pay | Admitting: Internal Medicine

## 2024-06-09 DIAGNOSIS — Z1231 Encounter for screening mammogram for malignant neoplasm of breast: Secondary | ICD-10-CM

## 2024-06-10 ENCOUNTER — Ambulatory Visit
Admission: RE | Admit: 2024-06-10 | Discharge: 2024-06-10 | Disposition: A | Discharge status: Home / Self Care | Source: Ambulatory Visit | Attending: Internal Medicine | Admitting: Internal Medicine

## 2024-06-10 DIAGNOSIS — Z1231 Encounter for screening mammogram for malignant neoplasm of breast: Secondary | ICD-10-CM

## 2024-06-16 ENCOUNTER — Ambulatory Visit: Payer: Self-pay | Admitting: Internal Medicine

## 2024-06-16 LAB — HM MAMMOGRAPHY

## 2024-07-05 LAB — HM DIABETES EYE EXAM

## 2024-07-25 ENCOUNTER — Telehealth: Payer: Self-pay | Admitting: Internal Medicine

## 2024-07-25 ENCOUNTER — Other Ambulatory Visit: Payer: Self-pay

## 2024-07-25 MED ORDER — MIRTAZAPINE 15 MG PO TABS: 15.0000 mg | ORAL_TABLET | Freq: Every day | ORAL | 0 refills | Status: AC

## 2024-07-25 NOTE — Telephone Encounter (Unsigned)
 Copied from CRM #[id number]. Topic: Clinical - Medication Refill >> Jul 25, 2024 11:41 AM Harlene ORN wrote: Medication: meloxicam  (MOBIC ) 15 MG tablet, mirtazapine  (REMERON ) 15 MG tablet  Has the patient contacted their pharmacy? No (Agent: If no, request that the patient contact the pharmacy for the refill. If patient does not wish to contact the pharmacy document the reason why and proceed with request.) (Agent: If yes, when and what did the pharmacy advise?)  This is the patient's preferred pharmacy:  [id number] - [place] ([place]), [place] - [address] [place] ([place])  [address] Phone: [phone] Fax: [phone]   Is this the correct pharmacy for this prescription? Yes If no, delete pharmacy and type the correct one.   Has the prescription been filled recently? No  Is the patient out of the medication? Yes  Has the patient been seen for an appointment in the last year OR does the patient have an upcoming appointment? Yes  Can we respond through MyChart? No  Agent: Please be advised that Rx refills may take up to 3 business days. We ask that you follow-up with your pharmacy.

## 2024-07-25 NOTE — Telephone Encounter (Signed)
 Sent in

## 2024-08-01 ENCOUNTER — Ambulatory Visit (INDEPENDENT_AMBULATORY_CARE_PROVIDER_SITE_OTHER): Admitting: Internal Medicine

## 2024-08-01 ENCOUNTER — Encounter: Payer: Self-pay | Admitting: Internal Medicine

## 2024-08-01 VITALS — BP 134/86 | HR 73 | Temp 98.3°F | Ht 66.0 in | Wt 203.4 lb

## 2024-08-01 DIAGNOSIS — R1084 Generalized abdominal pain: Secondary | ICD-10-CM

## 2024-08-01 DIAGNOSIS — M25561 Pain in right knee: Secondary | ICD-10-CM | POA: Diagnosis not present

## 2024-08-01 DIAGNOSIS — M5441 Lumbago with sciatica, right side: Secondary | ICD-10-CM

## 2024-08-01 DIAGNOSIS — G8929 Other chronic pain: Secondary | ICD-10-CM | POA: Diagnosis not present

## 2024-08-01 DIAGNOSIS — Z96651 Presence of right artificial knee joint: Secondary | ICD-10-CM

## 2024-08-01 DIAGNOSIS — K219 Gastro-esophageal reflux disease without esophagitis: Secondary | ICD-10-CM | POA: Diagnosis not present

## 2024-08-01 MED ORDER — PANTOPRAZOLE SODIUM 40 MG PO TBEC: 40.0000 mg | DELAYED_RELEASE_TABLET | Freq: Every day | ORAL | 3 refills | Status: AC

## 2024-08-01 NOTE — Progress Notes (Deleted)
   LILLETTE Ileana Collet, PhD, LAT, ATC acting as a scribe for Artist Lloyd, MD.  Tami Martin is a 53 y.o. female who presents to [facility] today for LBP x ***. Pt locates pain to ***  Radiating pain: LE numbness/tingling: LE weakness: Aggravates: Treatments tried: meloxicam ,  Pt also c/o R knee pain following a TKR about 5 years ago  Dx imaging: 01/04/23 L-spine & t-spine XR  Pertinent review of systems: ***  Relevant historical information: ***   Exam:  LMP 01/02/2023  General: Well Developed, well nourished, and in no acute distress.   MSK: ***    Lab and Radiology Results No results found for this or any previous visit (from the past 72 hours). No results found.     Assessment and Plan: 53 y.o. female with ***   PDMP not reviewed this encounter. No orders of the defined types were placed in this encounter.  No orders of the defined types were placed in this encounter.    Discussed warning signs or symptoms. Please see discharge instructions. Patient expresses understanding.   ***

## 2024-08-01 NOTE — Assessment & Plan Note (Signed)
 Symptoms persist despite previous therapy, worsening with sitting and improving with movement. Current medications include ibuprofen , with minimal relief from ibuprofen . Refer to sports medicine for further evaluation and management. Refer to physical therapy for management.

## 2024-08-01 NOTE — Assessment & Plan Note (Signed)
 Significant persistent pain affects daily activities five years post-surgery. With some new instability and worsening pain. Refer to sports medicine for evaluation.

## 2024-08-01 NOTE — Patient Instructions (Signed)
 We have sent in protonix  (pantoprazole ) to take 1 pill daily for the stomach pain. Let us  know in 1 week if this is helping.  We will get you in with the sports medicine downstairs to help with the knee and back.

## 2024-08-01 NOTE — Progress Notes (Signed)
 Subjective:   Patient ID: Tami Martin, female    DOB: [birth year], 53 y.o.   MRN: [id number]  Discussed the use of AI scribe software for clinical note transcription with the patient, who gave verbal consent to proceed.  History of Present Illness Tami Martin is a 53 year old female who presents with worsening back pain and right knee pain post-replacement.  She has persistent back pain that has been worsening over time, exacerbated by sitting and moving, which makes daily activities challenging, especially with her children going to school. She completed a course of therapy and has one more session remaining. She uses meloxicam , recently refilled, and ibuprofen , but finds the latter ineffective.  Her right knee, which underwent replacement surgery five years ago, is causing significant persistent pain, although it does not give out. She has not seen the surgical team for follow-up since the surgery, as it has been a long time.  She experiences stomach pain radiating from the back to the front. No diarrhea, constipation, nausea, or vomiting, and she maintains a normal diet. A previous scan in May showed no abnormalities in the stomach or other organs. She is not currently taking any acid-reducing medications.  Review of Systems  Constitutional:  Positive for activity change. Negative for appetite change, chills, fatigue, fever and unexpected weight change.  Respiratory: Negative.    Cardiovascular: Negative.   Gastrointestinal:  Positive for abdominal pain. Negative for abdominal distention, blood in stool, constipation, diarrhea, nausea and vomiting.  Musculoskeletal:  Positive for arthralgias, back pain and myalgias. Negative for gait problem and joint swelling.  Skin: Negative.   Neurological: Negative.     Objective:  Physical Exam Constitutional:      Appearance: She is well-developed.  HENT:     Head: Normocephalic and atraumatic.  Cardiovascular:     Rate  and Rhythm: Normal rate and regular rhythm.  Pulmonary:     Effort: Pulmonary effort is normal. No respiratory distress.     Breath sounds: Normal breath sounds. No wheezing or rales.  Abdominal:     General: Bowel sounds are normal. There is no distension.     Palpations: Abdomen is soft.     Tenderness: There is abdominal tenderness. There is no rebound.     Comments: Diffuse tenderness without guarding or rebound  Musculoskeletal:        General: Tenderness present.     Cervical back: Normal range of motion.     Comments: Pain back diffusely and right knee  Skin:    General: Skin is warm and dry.  Neurological:     Mental Status: She is alert and oriented to person, place, and time.     Coordination: Coordination normal.     Vitals:   08/01/24 0913  BP: 134/86  Pulse: 73  Temp: 98.3 F (36.8 C)  TempSrc: Oral  SpO2: 96%  Weight: 203 lb 6.4 oz (92.3 kg)  Height: 5' 6 (1.676 m)    Assessment and Plan Assessment & Plan Chronic low back pain   Symptoms persist despite previous therapy, worsening with sitting and improving with movement. Current medications include ibuprofen , with minimal relief from ibuprofen . Refer to sports medicine for further evaluation and management. Refer to physical therapy for management.  Right knee pain after knee replacement   Significant persistent pain affects daily activities five years post-surgery. With some new instability and worsening pain. Refer to sports medicine for evaluation.  Chronic abdominal pain   Chronic pain with no  clear etiology and normal imaging. Differential includes acid-related issues or ulcers versus radiation of pain from back. Prescribe protonix  40 mg daily for 1-2 weeks to assess if this helps. Reassurance gien that prior imaging and labs were normal.

## 2024-08-01 NOTE — Assessment & Plan Note (Signed)
 Not taking medication currently and using nsaids for pain regularly. Rx protonix  40 mg daily and assess within 1-2 weeks if effective.

## 2024-08-01 NOTE — Assessment & Plan Note (Signed)
 Chronic pain with no clear etiology and normal imaging. Differential includes acid-related issues or ulcers versus radiation of pain from back. Prescribe protonix  40 mg daily for 1-2 weeks to assess if this helps. Reassurance gien that prior imaging and labs were normal.

## 2024-08-05 ENCOUNTER — Ambulatory Visit: Admitting: Family Medicine

## 2024-08-12 ENCOUNTER — Ambulatory Visit: Admitting: Family Medicine

## 2024-08-13 NOTE — Progress Notes (Unsigned)
   LILLETTE Ileana Collet, PhD, LAT, ATC acting as a scribe for Artist Lloyd, MD.  Tami Martin is a 53 y.o. female who presents to [facility] at [facility] today for LBP x ***. Pt locates pain to ***  Radiating pain: LE numbness/tingling: LE weakness: Aggravates: Treatments tried: meloxicam ,  Pt also c/o R knee pain following a TKR about 5 years ago  Dx imaging: 01/04/23 L-spine & t-spine XR  Pertinent review of systems: ***  Relevant historical information: ***   Exam:  LMP 01/02/2023  General: Well Developed, well nourished, and in no acute distress.   MSK: ***    Lab and Radiology Results No results found for this or any previous visit (from the past 72 hours). No results found.     Assessment and Plan: 53 y.o. female with ***   PDMP not reviewed this encounter. No orders of the defined types were placed in this encounter.  No orders of the defined types were placed in this encounter.    Discussed warning signs or symptoms. Please see discharge instructions. Patient expresses understanding.   ***

## 2024-08-14 ENCOUNTER — Ambulatory Visit

## 2024-08-14 ENCOUNTER — Ambulatory Visit (INDEPENDENT_AMBULATORY_CARE_PROVIDER_SITE_OTHER): Admitting: Family Medicine

## 2024-08-14 ENCOUNTER — Other Ambulatory Visit: Payer: Self-pay

## 2024-08-14 ENCOUNTER — Encounter: Payer: Self-pay | Admitting: Family Medicine

## 2024-08-14 VITALS — BP 140/92 | HR 72 | Ht 66.0 in | Wt 207.0 lb

## 2024-08-14 DIAGNOSIS — M25561 Pain in right knee: Secondary | ICD-10-CM

## 2024-08-14 DIAGNOSIS — G8929 Other chronic pain: Secondary | ICD-10-CM

## 2024-08-14 DIAGNOSIS — M5441 Lumbago with sciatica, right side: Secondary | ICD-10-CM | POA: Diagnosis not present

## 2024-08-14 MED ORDER — PREDNISONE 50 MG PO TABS: 50.0000 mg | ORAL_TABLET | Freq: Every day | ORAL | 0 refills | Status: AC

## 2024-08-14 MED ORDER — GABAPENTIN 100 MG PO CAPS: 100.0000 mg | ORAL_CAPSULE | Freq: Every evening | ORAL | 2 refills | Status: AC | PRN

## 2024-08-14 NOTE — Patient Instructions (Addendum)
 Thank you for coming in today.   Please get an Xray today before you leave   Rx sent in for 5 day Prednisone  burst and Gabapentin  100-300 mg at bedtime as needed.   Keep scheduled appointment with physical therapy  See you back in 1 month.

## 2024-08-17 NOTE — Therapy (Signed)
 OUTPATIENT PHYSICAL THERAPY THORACOLUMBAR EVALUATION   Patient Name: Tami Martin MRN: [id number] DOB:[birth year], 53 y.o., female Today's Date: 08/18/2024  END OF SESSION:  PT End of Session - 08/18/24 1056     Visit Number 1    Number of Visits 12    Date for PT Re-Evaluation 10/18/24    Authorization Type [place] MCD    PT Start Time 0930    PT Stop Time 1000    PT Time Calculation (min) 30 min    Activity Tolerance Patient tolerated treatment well    Behavior During Therapy [facility] for tasks assessed/performed          Past Medical History:  Diagnosis Date   Allergy    Seasonal allergies    DDD (degenerative disc disease), lumbosacral 2011   GERD (gastroesophageal reflux disease)    denies today 07/2016   Prediabetes 2014   Past Surgical History:  Procedure Laterality Date   KNEE SURGERY Right 2020   Patient Active Problem List   Diagnosis Date Noted   Chronic knee pain after total replacement of right knee joint 08/01/2024   Candidal skin infection 05/29/2024   Poor appetite 05/29/2024   Thrombocytosis 05/29/2024   Generalized weakness 04/14/2024   Dizziness 04/14/2024   Family history of catecholaminergic polymorphic ventricular tachycardia 04/14/2024   Tachycardia 04/14/2024   Lumbar spondylosis 04/14/2024   History of sciatica 04/14/2024   Abdominal pain 07/06/2022   Right lower quadrant abdominal tenderness with rebound tenderness 07/06/2022   Chronic bilateral low back pain with right-sided sciatica 03/09/2022   Hot flashes 09/16/2021   Pes planus 10/11/2017   Prediabetes 08/29/2016   GERD (gastroesophageal reflux disease) 08/29/2016   Acute intractable headache 01/22/2015   Encounter for general adult medical examination with abnormal findings 09/11/2014   Allergic rhinitis 06/10/2013   Sciatica neuralgia 03/04/2013    PCP: Rollene Almarie LABOR, MD  REFERRING PROVIDER: Rollene Almarie LABOR, MD  REFERRING DIAG: G89.29,M54.41 (ICD-10-CM) -  Chronic bilateral low back pain with right-sided sciatica M25.561,G89.29,Z96.651 (ICD-10-CM) - Chronic knee pain after total replacement of right knee joint  Rationale for Evaluation and Treatment: Rehabilitation  THERAPY DIAG:  Chronic bilateral low back pain with right-sided sciatica  Chronic knee pain after total replacement of right knee joint  Muscle weakness (generalized)  ONSET DATE: chronic  SUBJECTIVE:                                                                                                                                                                                           SUBJECTIVE STATEMENT: Patient arrives to [facility] with c/o chronic R knee and low back pain.  Symptoms ongoing over several years.  PERTINENT HISTORY:  Tami Martin is a 53 year old female who presents with worsening back pain and right knee pain post-replacement.   She has persistent back pain that has been worsening over time, exacerbated by sitting and moving, which makes daily activities challenging, especially with her children going to school. She completed a course of therapy and has one more session remaining. She uses meloxicam , recently refilled, and ibuprofen , but finds the latter ineffective.   Her right knee, which underwent replacement surgery five years ago, is causing significant persistent pain, although it does not give out. She has not seen the surgical team for follow-up since the surgery, as it has been a long time.   She experiences stomach pain radiating from the back to the front. No diarrhea, constipation, nausea, or vomiting, and she maintains a normal diet. A previous scan in May showed no abnormalities in the stomach or other organs. She is not currently taking any acid-reducing medications.  PAIN:  Are you having pain? Yes: NPRS scale: 7/10 Pain location: R knee and low back Pain description: ache Aggravating factors: flexion activities, prolonged standing,  arising from sit Relieving factors: undetermined  PRECAUTIONS: None  RED FLAGS: None   WEIGHT BEARING RESTRICTIONS: No  FALLS:  Has patient fallen in last 6 months? No   OCCUPATION: not working  PLOF: Independent  PATIENT GOALS: To manage my pain  NEXT MD VISIT: 09/15/24 Dr. Joane  OBJECTIVE:  Note: Objective measures were completed at Evaluation unless otherwise noted.  DIAGNOSTIC FINDINGS:  Diagnostic Limited MSK Ultrasound of: Right knee Quad tendon intact normal. Patellar tendon is intact.  Joint line the edge of the prosthesis is visible no significant abnormalities visible Impression: Unremarkable MSK ultrasound examination of the knee status post knee replacement.   X-ray images lumbar spine and right knee obtained today personally and independently interpreted.   Lumbar spine: DDD L5-S1.  No acute fractures are visible.   Right knee: No fractures.  No hardware loosening visible.  PATIENT SURVEYS:  Patient-specific activity scoring scheme (Point to one number):  0 represents "unable to perform." 10 represents "able to perform at prior level. 0 1 2 3 4 5 6 7 8 9  10 (Date and Score) Activity Initial  Activity Eval     Standing from sitting  5    Stair climbing  6    Bed mobility  4    Total score = sum of the activity scores/number of activities Minimum detectable change (90%CI) for average score = 2 points Minimum detectable change (90%CI) for single activity score = 3 points PSFS developed by: Rosalee MYRTIS Marvis KYM Charlet CHRISTELLA., & Binkley, J. (1995). Assessing disability and change on individual  patients: a report of a patient specific measure. Physiotherapy [place], 47, [phone]. Reproduced with the permission of the authors  Score: 15/30  MUSCLE LENGTH: Hamstrings: Right 90 deg; Left 90 deg  POSTURE: No Significant postural limitations  PALPATION: deferred  LUMBAR ROM:   AROM eval  Flexion 90%  Extension 75%  Right lateral flexion  75%  Left lateral flexion 75%  Right rotation   Left rotation    (Blank rows = not tested)  LOWER EXTREMITY ROM:     Active  Right eval Left eval  Hip flexion    Hip extension    Hip abduction    Hip adduction    Hip internal rotation    Hip external rotation    Knee flexion 125d  Knee extension 0d   Ankle dorsiflexion    Ankle plantarflexion    Ankle inversion    Ankle eversion     (Blank rows = not tested)  LOWER EXTREMITY MMT:    MMT Right eval Left eval  Hip flexion    Hip extension    Hip abduction    Hip adduction    Hip internal rotation    Hip external rotation    Knee flexion 4-   Knee extension 4-   Ankle dorsiflexion    Ankle plantarflexion    Ankle inversion    Ankle eversion     (Blank rows = not tested)  LUMBAR SPECIAL TESTS:  Straight leg raise test: Negative and Slump test: Negative  FUNCTIONAL TESTS:  30 seconds chair stand test 6 reps arms crossed  GAIT: Distance walked: 51ftx2 Assistive device utilized: None Level of assistance: Complete Independence Comments: unremarkable  TREATMENT:                                                                                                                            [facility] Adult PT Treatment:                                                DATE: 08/18/24 Eval only(late for appointment)   PATIENT EDUCATION:  Education details: Discussed eval findings, rehab rationale and POC and patient is in agreement  Person educated: Patient Education method: Explanation and Handouts Education comprehension: verbalized understanding and needs further education  HOME EXERCISE PROGRAM: TBD based on time constraints  ASSESSMENT:  CLINICAL IMPRESSION: Patient is a 53 y.o. female who was seen today for physical therapy evaluation and treatment for chronic low back and R knee pain 5 years s/p TKR.  Patient presents with functional mobility and ROM in lumbar spine and R knee.  PSFS shows 50% perceived function.   Core weakness evident by struggles with bed mobility.  30s chair stand test finds BLE weakness  OBJECTIVE IMPAIRMENTS: decreased activity tolerance, decreased endurance, decreased knowledge of condition, decreased mobility, decreased strength, and pain.   ACTIVITY LIMITATIONS: lifting, bending, standing, squatting, stairs, and bed mobility  PERSONAL FACTORS: Age, Fitness, Past/current experiences, and Time since onset of injury/illness/exacerbation are also affecting patient's functional outcome.   REHAB POTENTIAL: Good  CLINICAL DECISION MAKING: Stable/uncomplicated  EVALUATION COMPLEXITY: Moderate   GOALS: Goals reviewed with patient? No  SHORT TERM GOALS: Target date: 09/08/2024    Patient to demonstrate independence in HEP  Baseline: TBD Goal status: INITIAL  2.  Assess and set goal Baseline: TBD Goal status: INITIAL   LONG TERM GOALS: Target date: 10/13/2024  Patient will acknowledge 4/10 pain at least once during episode of care   Baseline:  Goal status: INITIAL  2.  Patient will increase 30s chair stand reps from 6 to  10 with crossed arms to demonstrate and improved functional ability with less pain/difficulty as well as reduce fall risk.  Baseline:  Goal status: INITIAL  3.  Patient will score at least 20/30 on PSFS to signify clinically meaningful improvement in functional abilities.    Baseline: 15/30 Goal status: INITIAL  4.  Increase R knee strength to 4/5 Baseline: 4-/5 Goal status: INITIAL    PLAN:  PT FREQUENCY: 1-2x/week  PT DURATION: 6 weeks  PLANNED INTERVENTIONS: 97110-Therapeutic exercises, 97530- Therapeutic activity, [id number]- Neuromuscular re-education, 97535- Self Care, 02859- Manual therapy, [phone]- Gait training, Patient/Family education, Balance training, and Stair training.  PLAN FOR NEXT SESSION: HEP review and update, manual techniques as appropriate, aerobic tasks, ROM and flexibility activities, strengthening and PREs, TPDN,  gait and balance training as needed    For all possible CPT codes, reference the Planned Interventions line above.     Check all conditions that are expected to impact treatment: {Conditions expected to impact treatment:None of these apply   If treatment provided at initial evaluation, no treatment charged due to lack of authorization.       Kemora Pinard M Amadi Yoshino, PT 08/18/2024, 10:58 AM

## 2024-08-18 ENCOUNTER — Ambulatory Visit: Attending: Internal Medicine

## 2024-08-18 ENCOUNTER — Other Ambulatory Visit: Payer: Self-pay

## 2024-08-18 DIAGNOSIS — M25561 Pain in right knee: Secondary | ICD-10-CM | POA: Insufficient documentation

## 2024-08-18 DIAGNOSIS — M5441 Lumbago with sciatica, right side: Secondary | ICD-10-CM | POA: Diagnosis present

## 2024-08-18 DIAGNOSIS — G8929 Other chronic pain: Secondary | ICD-10-CM | POA: Diagnosis present

## 2024-08-18 DIAGNOSIS — R293 Abnormal posture: Secondary | ICD-10-CM | POA: Diagnosis present

## 2024-08-18 DIAGNOSIS — M6281 Muscle weakness (generalized): Secondary | ICD-10-CM | POA: Insufficient documentation

## 2024-08-18 DIAGNOSIS — Z96651 Presence of right artificial knee joint: Secondary | ICD-10-CM | POA: Diagnosis present

## 2024-08-20 ENCOUNTER — Ambulatory Visit: Payer: Self-pay | Admitting: Family Medicine

## 2024-08-20 NOTE — Progress Notes (Signed)
 Right knee x-ray shows a knee replacement without complication or failure.  It is swollen a bit.

## 2024-08-20 NOTE — Progress Notes (Signed)
 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

## 2024-08-27 ENCOUNTER — Encounter: Payer: Self-pay | Admitting: Physical Therapy

## 2024-08-27 ENCOUNTER — Ambulatory Visit: Admitting: Physical Therapy

## 2024-08-27 DIAGNOSIS — M6281 Muscle weakness (generalized): Secondary | ICD-10-CM

## 2024-08-27 DIAGNOSIS — G8929 Other chronic pain: Secondary | ICD-10-CM

## 2024-08-27 NOTE — Therapy (Signed)
 OUTPATIENT PHYSICAL THERAPY THORACOLUMBAR EVALUATION   Patient Name: Tami Martin MRN: [id number] DOB:04/08/1971, 53 y.o., female Today's Date: 08/27/2024  END OF SESSION:  PT End of Session - 08/27/24 1439     Visit Number 2    Number of Visits 12    Date for Recertification  10/18/24    Authorization Type [place] MCD    PT Start Time 1445    PT Stop Time 1525    PT Time Calculation (min) 40 min    Activity Tolerance Patient tolerated treatment well    Behavior During Therapy [facility] for tasks assessed/performed          Past Medical History:  Diagnosis Date   Allergy    Seasonal allergies    DDD (degenerative disc disease), lumbosacral 2011   GERD (gastroesophageal reflux disease)    denies today 07/2016   Prediabetes 2014   Past Surgical History:  Procedure Laterality Date   KNEE SURGERY Right 2020   Patient Active Problem List   Diagnosis Date Noted   Chronic knee pain after total replacement of right knee joint 08/01/2024   Candidal skin infection 05/29/2024   Poor appetite 05/29/2024   Thrombocytosis 05/29/2024   Generalized weakness 04/14/2024   Dizziness 04/14/2024   Family history of catecholaminergic polymorphic ventricular tachycardia 04/14/2024   Tachycardia 04/14/2024   Lumbar spondylosis 04/14/2024   History of sciatica 04/14/2024   Abdominal pain 07/06/2022   Right lower quadrant abdominal tenderness with rebound tenderness 07/06/2022   Chronic bilateral low back pain with right-sided sciatica 03/09/2022   Hot flashes 09/16/2021   Pes planus 10/11/2017   Prediabetes 08/29/2016   GERD (gastroesophageal reflux disease) 08/29/2016   Acute intractable headache 01/22/2015   Encounter for general adult medical examination with abnormal findings 09/11/2014   Allergic rhinitis 06/10/2013   Sciatica neuralgia 03/04/2013    PCP: Rollene Almarie LABOR, MD  REFERRING PROVIDER: Rollene Almarie LABOR, MD  REFERRING DIAG: G89.29,M54.41 (ICD-10-CM) -  Chronic bilateral low back pain with right-sided sciatica M25.561,G89.29,Z96.651 (ICD-10-CM) - Chronic knee pain after total replacement of right knee joint  Rationale for Evaluation and Treatment: Rehabilitation  THERAPY DIAG:  Chronic bilateral low back pain with right-sided sciatica  Chronic knee pain after total replacement of right knee joint  Muscle weakness (generalized)  Chronic right-sided low back pain with right-sided sciatica  ONSET DATE: chronic  SUBJECTIVE:                                                                                                                                                                                           SUBJECTIVE STATEMENT: Pt attended today's session  with reports of 6/10 pain. Pt stated that they have maintained good compliance with current HEP.     EVAL: Patient arrives to O[facility]T with c/o chronic R knee and low back pain.  Symptoms ongoing over several years.  PERTINENT HISTORY:  Tami Martin is a 53 year old female who presents with worsening back pain and right knee pain post-replacement.   She has persistent back pain that has been worsening over time, exacerbated by sitting and moving, which makes daily activities challenging, especially with her children going to school. She completed a course of therapy and has one more session remaining. She uses meloxicam , recently refilled, and ibuprofen , but finds the latter ineffective.   Her right knee, which underwent replacement surgery five years ago, is causing significant persistent pain, although it does not give out. She has not seen the surgical team for follow-up since the surgery, as it has been a long time.   She experiences stomach pain radiating from the back to the front. No diarrhea, constipation, nausea, or vomiting, and she maintains a normal diet. A previous scan in May showed no abnormalities in the stomach or other organs. She is not currently taking any  acid-reducing medications.  PAIN:  Are you having pain? Yes: NPRS scale: 7/10 Pain location: R knee and low back Pain description: ache Aggravating factors: flexion activities, prolonged standing, arising from sit Relieving factors: undetermined  PRECAUTIONS: None  RED FLAGS: None   WEIGHT BEARING RESTRICTIONS: No  FALLS:  Has patient fallen in last 6 months? No   OCCUPATION: not working  PLOF: Independent  PATIENT GOALS: To manage my pain  NEXT MD VISIT: 09/15/24 Dr. Joane  OBJECTIVE:  Note: Objective measures were completed at Evaluation unless otherwise noted.  DIAGNOSTIC FINDINGS:  Diagnostic Limited MSK Ultrasound of: Right knee Quad tendon intact normal. Patellar tendon is intact.  Joint line the edge of the prosthesis is visible no significant abnormalities visible Impression: Unremarkable MSK ultrasound examination of the knee status post knee replacement.   X-ray images lumbar spine and right knee obtained today personally and independently interpreted.   Lumbar spine: DDD L5-S1.  No acute fractures are visible.   Right knee: No fractures.  No hardware loosening visible.  PATIENT SURVEYS:  Patient-specific activity scoring scheme (Point to one number):  0 represents "unable to perform." 10 represents "able to perform at prior level. 0 1 2 3 4 5 6 7 8 9  10 (Date and Score) Activity Initial  Activity Eval     Standing from sitting  5    Stair climbing  6    Bed mobility  4    Total score = sum of the activity scores/number of activities Minimum detectable change (90%CI) for average score = 2 points Minimum detectable change (90%CI) for single activity score = 3 points PSFS developed by: Rosalee MYRTIS Marvis KYM Charlet CHRISTELLA., & Binkley, J. (1995). Assessing disability and change on individual  patients: a report of a patient specific measure. Physiotherapy [place], 47, [phone]. Reproduced with the permission of the authors  Score: 15/30  MUSCLE  LENGTH: Hamstrings: Right 90 deg; Left 90 deg  POSTURE: No Significant postural limitations  PALPATION: deferred  LUMBAR ROM:   AROM eval  Flexion 90%  Extension 75%  Right lateral flexion 75%  Left lateral flexion 75%  Right rotation   Left rotation    (Blank rows = not tested)  LOWER EXTREMITY ROM:     Active  Right eval Left eval  Hip flexion    Hip extension    Hip abduction    Hip adduction    Hip internal rotation    Hip external rotation    Knee flexion 125d   Knee extension 0d   Ankle dorsiflexion    Ankle plantarflexion    Ankle inversion    Ankle eversion     (Blank rows = not tested)  LOWER EXTREMITY MMT:    MMT Right eval Left eval  Hip flexion    Hip extension    Hip abduction    Hip adduction    Hip internal rotation    Hip external rotation    Knee flexion 4-   Knee extension 4-   Ankle dorsiflexion    Ankle plantarflexion    Ankle inversion    Ankle eversion     (Blank rows = not tested)  LUMBAR SPECIAL TESTS:  Straight leg raise test: Negative and Slump test: Negative  FUNCTIONAL TESTS:  30 seconds chair stand test 6 reps arms crossed  GAIT: Distance walked: 77ftx2 Assistive device utilized: None Level of assistance: Complete Independence Comments: unremarkable  TREATMENT:    [facility] Adult PT Treatment:                                                DATE: 08/27/2024  Therapeutic Exercise: NuStep 8' Supine QL stretch 2x1' B Therapeutic Activity: Supine bridge on Pball 2x12, hold 3s HEP review, performance, and education Pt demonstrated and verbalized competence Bent knee fall out 2x12 B, hold 1s, GTB Pball rollout 2x15, hold 1s                                                                                                                          [facility] Adult PT Treatment:                                                DATE: 08/18/24 Eval only(late for appointment)   PATIENT EDUCATION:  Education details: Discussed  eval findings, rehab rationale and POC and patient is in agreement  Person educated: Patient Education method: Explanation and Handouts Education comprehension: verbalized understanding and needs further education  HOME EXERCISE PROGRAM: Access Code: [email or web address] URL: [email or web address] Date: 08/27/2024 Prepared by: Mabel Kiang  Exercises - Supine Quadratus Lumborum Stretch  - 1 x daily - 7 x weekly - 2-3 sets - 1 reps - 64m hold - Supine March  - 1 x daily - 7 x weekly - 3 sets - 10 reps - Supine Bridge with Resistance Band  - 1 x daily - 4 x weekly - 2-3 sets - 12 reps - 5s hold  ASSESSMENT:  CLINICAL IMPRESSION: Pt attended physical therapy session  for continuation of treatment regarding LBP. Today's treatment focused on improvement of  lumbar stability, dynamic core strength, posterior chain motiltiy/moility, and proximal hip strength. Pt gives reports of wlaking around more and feeling a little bit better, however, pain continues around a 6/10 today. Pt showed good tolerance to administered treatment with no adverse effects by the end of session. Skilled intervention was utilized via activity modification for pt tolerance with task completion, functional progression/regression promoting best outcomes inline with current rehab goals, as well as minimal verbal/tactile cuing alongside no physical assistance for safe and appropriate performance of today's activities. Continue with therapeutic focus on current POC outline, f/u on administered HEP this session.    EVAL: Patient is a 53 y.o. female who was seen today for physical therapy evaluation and treatment for chronic low back and R knee pain 5 years s/p TKR.  Patient presents with functional mobility and ROM in lumbar spine and R knee.  PSFS shows 50% perceived function.  Core weakness evident by struggles with bed mobility.  30s chair stand test finds BLE weakness  OBJECTIVE IMPAIRMENTS: decreased activity tolerance,  decreased endurance, decreased knowledge of condition, decreased mobility, decreased strength, and pain.   ACTIVITY LIMITATIONS: lifting, bending, standing, squatting, stairs, and bed mobility  PERSONAL FACTORS: Age, Fitness, Past/current experiences, and Time since onset of injury/illness/exacerbation are also affecting patient's functional outcome.   REHAB POTENTIAL: Good  CLINICAL DECISION MAKING: Stable/uncomplicated  EVALUATION COMPLEXITY: Moderate   GOALS: Goals reviewed with patient? No  SHORT TERM GOALS: Target date: 09/08/2024    Patient to demonstrate independence in HEP  Baseline: TBD Goal status: INITIAL  2.  Assess and set goal Baseline: TBD Goal status: INITIAL   LONG TERM GOALS: Target date: 10/13/2024  Patient will acknowledge 4/10 pain at least once during episode of care   Baseline:  Goal status: INITIAL  2.  Patient will increase 30s chair stand reps from 6 to 10 with crossed arms to demonstrate and improved functional ability with less pain/difficulty as well as reduce fall risk.  Baseline:  Goal status: INITIAL  3.  Patient will score at least 20/30 on PSFS to signify clinically meaningful improvement in functional abilities.    Baseline: 15/30 Goal status: INITIAL  4.  Increase R knee strength to 4/5 Baseline: 4-/5 Goal status: INITIAL    PLAN:  PT FREQUENCY: 1-2x/week  PT DURATION: 6 weeks  PLANNED INTERVENTIONS: 97110-Therapeutic exercises, 97530- Therapeutic activity, [id number]- Neuromuscular re-education, 97535- Self Care, 02859- Manual therapy, [phone]- Gait training, Patient/Family education, Balance training, and Stair training.  PLAN FOR NEXT SESSION: HEP review and update, manual techniques as appropriate, aerobic tasks, ROM and flexibility activities, strengthening and PREs, TPDN, gait and balance training as needed    For all possible CPT codes, reference the Planned Interventions line above.     Check all conditions that are  expected to impact treatment: {Conditions expected to impact treatment:None of these apply   If treatment provided at initial evaluation, no treatment charged due to lack of authorization.       Mabel Kiang, PT, DPT 08/27/2024, 3:25 PM

## 2024-09-01 ENCOUNTER — Encounter: Admitting: Physical Therapy

## 2024-09-02 ENCOUNTER — Encounter: Payer: Self-pay | Admitting: Physical Therapy

## 2024-09-02 ENCOUNTER — Ambulatory Visit: Admitting: Physical Therapy

## 2024-09-02 DIAGNOSIS — G8929 Other chronic pain: Secondary | ICD-10-CM | POA: Diagnosis not present

## 2024-09-02 DIAGNOSIS — M6281 Muscle weakness (generalized): Secondary | ICD-10-CM

## 2024-09-02 DIAGNOSIS — R293 Abnormal posture: Secondary | ICD-10-CM

## 2024-09-02 NOTE — Therapy (Signed)
 OUTPATIENT PHYSICAL THERAPY THORACOLUMBAR TREATMENT   Patient Name: Tami Martin MRN: [id number] DOB:Feb 13, 1971, 53 y.o., female Today's Date: 09/02/2024  END OF SESSION:  PT End of Session - 09/02/24 1344     Visit Number 3    Number of Visits 12    Date for Recertification  10/18/24    Authorization Type [place] [employer]    Authorization Time Period 27 visit limit for the whole year    Authorization - Visit Number 2    Authorization - Number of Visits 27    PT Start Time 1350    PT Stop Time 1430    PT Time Calculation (min) 40 min    Activity Tolerance Patient tolerated treatment well    Behavior During Therapy WFL for tasks assessed/performed           Past Medical History:  Diagnosis Date   Allergy    Seasonal allergies    DDD (degenerative disc disease), lumbosacral 2011   GERD (gastroesophageal reflux disease)    denies today 07/2016   Prediabetes 2014   Past Surgical History:  Procedure Laterality Date   KNEE SURGERY Right 2020   Patient Active Problem List   Diagnosis Date Noted   Chronic knee pain after total replacement of right knee joint 08/01/2024   Candidal skin infection 05/29/2024   Poor appetite 05/29/2024   Thrombocytosis 05/29/2024   Generalized weakness 04/14/2024   Dizziness 04/14/2024   Family history of catecholaminergic polymorphic ventricular tachycardia 04/14/2024   Tachycardia 04/14/2024   Lumbar spondylosis 04/14/2024   History of sciatica 04/14/2024   Abdominal pain 07/06/2022   Right lower quadrant abdominal tenderness with rebound tenderness 07/06/2022   Chronic bilateral low back pain with right-sided sciatica 03/09/2022   Hot flashes 09/16/2021   Pes planus 10/11/2017   Prediabetes 08/29/2016   GERD (gastroesophageal reflux disease) 08/29/2016   Acute intractable headache 01/22/2015   Encounter for general adult medical examination with abnormal findings 09/11/2014   Allergic rhinitis 06/10/2013   Sciatica neuralgia  03/04/2013    PCP: Rollene Almarie LABOR, MD  REFERRING PROVIDER: Rollene Almarie LABOR, MD  REFERRING DIAG: G89.29,M54.41 (ICD-10-CM) - Chronic bilateral low back pain with right-sided sciatica M25.561,G89.29,Z96.651 (ICD-10-CM) - Chronic knee pain after total replacement of right knee joint  Rationale for Evaluation and Treatment: Rehabilitation  THERAPY DIAG:  Chronic bilateral low back pain with right-sided sciatica  Chronic knee pain after total replacement of right knee joint  Muscle weakness (generalized)  Chronic right-sided low back pain with right-sided sciatica  Abnormal posture  ONSET DATE: chronic  SUBJECTIVE:  SUBJECTIVE STATEMENT: Pt states pain is a little better after doing exercises. Reports problems with carrying.    EVAL: Patient arrives to [facility] with c/o chronic R knee and low back pain.  Symptoms ongoing over several years.  PERTINENT HISTORY:  Tami Martin is a 53 year old female who presents with worsening back pain and right knee pain post-replacement.   She has persistent back pain that has been worsening over time, exacerbated by sitting and moving, which makes daily activities challenging, especially with her children going to school. She completed a course of therapy and has one more session remaining. She uses meloxicam , recently refilled, and ibuprofen , but finds the latter ineffective.   Her right knee, which underwent replacement surgery five years ago, is causing significant persistent pain, although it does not give out. She has not seen the surgical team for follow-up since the surgery, as it has been a long time.   She experiences stomach pain radiating from the back to the front. No diarrhea, constipation, nausea, or vomiting, and she maintains a  normal diet. A previous scan in May showed no abnormalities in the stomach or other organs. She is not currently taking any acid-reducing medications.  PAIN:  Are you having pain? Yes: NPRS scale: 6/10 Pain location: R knee and low back Pain description: ache Aggravating factors: flexion activities, prolonged standing, arising from sit Relieving factors: undetermined  PRECAUTIONS: None  WEIGHT BEARING RESTRICTIONS: No  FALLS:  Has patient fallen in last 6 months? No   OCCUPATION: not working  PATIENT GOALS: To manage my pain  NEXT MD VISIT: 09/15/24 Dr. Joane  OBJECTIVE:  Note: Objective measures were completed at Evaluation unless otherwise noted.  DIAGNOSTIC FINDINGS:  Diagnostic Limited MSK Ultrasound of: Right knee Quad tendon intact normal. Patellar tendon is intact.  Joint line the edge of the prosthesis is visible no significant abnormalities visible Impression: Unremarkable MSK ultrasound examination of the knee status post knee replacement.   X-ray images lumbar spine and right knee obtained today personally and independently interpreted.   Lumbar spine: DDD L5-S1.  No acute fractures are visible.   Right knee: No fractures.  No hardware loosening visible.  PATIENT SURVEYS:  Patient-specific activity scoring scheme (Point to one number):  0 represents "unable to perform." 10 represents "able to perform at prior level. 0 1 2 3 4 5 6 7 8 9  10 (Date and Score) Activity Initial  Activity Eval     Standing from sitting  5    Stair climbing  6    Bed mobility  4    Total score = sum of the activity scores/number of activities Minimum detectable change (90%CI) for average score = 2 points Minimum detectable change (90%CI) for single activity score = 3 points PSFS developed by: Rosalee MYRTIS Marvis KYM Charlet CHRISTELLA., & Binkley, J. (1995). Assessing disability and change on individual  patients: a report of a patient specific measure. Physiotherapy [place], 47,  [phone]. Reproduced with the permission of the authors  Score: 15/30  MUSCLE LENGTH: Hamstrings: Right 90 deg; Left 90 deg  LUMBAR ROM:   AROM eval  Flexion 90%  Extension 75%  Right lateral flexion 75%  Left lateral flexion 75%  Right rotation   Left rotation    (Blank rows = not tested)  LOWER EXTREMITY ROM:     Active  Right eval Left eval  Hip flexion    Hip extension    Hip abduction    Hip adduction  Hip internal rotation    Hip external rotation    Knee flexion 125d   Knee extension 0d   Ankle dorsiflexion    Ankle plantarflexion    Ankle inversion    Ankle eversion     (Blank rows = not tested)  LOWER EXTREMITY MMT:    MMT Right eval Left eval  Hip flexion    Hip extension    Hip abduction    Hip adduction    Hip internal rotation    Hip external rotation    Knee flexion 4-   Knee extension 4-   Ankle dorsiflexion    Ankle plantarflexion    Ankle inversion    Ankle eversion     (Blank rows = not tested)  LUMBAR SPECIAL TESTS:  Straight leg raise test: Negative and Slump test: Negative  FUNCTIONAL TESTS:  30 seconds chair stand test 6 reps arms crossed  GAIT: Distance walked: 43ftx2 Assistive device utilized: None Level of assistance: Complete Independence Comments: unremarkable  TREATMENT:    [facility] Adult PT Treatment:                                                DATE: 09/02/2024 Therapeutic Exercise: NuStep L4 x 8' with LEs/UEs Seated pball flexion x30, with lateral flexion x 30 Seated hamstring stretch x 30 Seated hip adductor stretch x 30 Supine QL stretch x30 Neuromuscular Re-ed: Supine PPT for TSA contraction 10x5 Supine TSA + clamshell red TB iso 2x10 Supine bridge with red TB around knees 10x5 SLR 2x10 Supine hip abd 2x10 Therapeutic Activity: Sit<>stand red TB around knees 2x10 cues to keep from R knee valgus and/or increased bilat foot/ankle pronation/ER Squat touch lifting 5# 2x10 weight focusing on  maintaining trunk stability and decreasing thoracic/lumbar flexion    [facility] Adult PT Treatment:                                                DATE: 08/27/2024 Therapeutic Exercise: NuStep 8' Supine QL stretch 2x1' B Therapeutic Activity: Supine bridge on Pball 2x12, hold 3s HEP review, performance, and education Pt demonstrated and verbalized competence Bent knee fall out 2x12 B, hold 1s, GTB Pball rollout 2x15, hold 1s                                                                                                                          [facility] Adult PT Treatment:                                                DATE: 08/18/24  Eval only(late for appointment)   PATIENT EDUCATION:  Education details: Discussed eval findings, rehab rationale and POC and patient is in agreement  Person educated: Patient Education method: Explanation and Handouts Education comprehension: verbalized understanding and needs further education  HOME EXERCISE PROGRAM: Access Code: [email or web address] URL: [email or web address] Date: 08/27/2024 Prepared by: Mabel Kiang  Exercises - Supine Quadratus Lumborum Stretch  - 1 x daily - 7 x weekly - 2-3 sets - 1 reps - 34m hold - Supine March  - 1 x daily - 7 x weekly - 3 sets - 10 reps - Supine Bridge with Resistance Band  - 1 x daily - 4 x weekly - 2-3 sets - 12 reps - 5s hold  ASSESSMENT:  CLINICAL IMPRESSION: Session continued to focus on improving pt's lumbar and R knee pain. Required some cueing for proper form with HEP. Concentrated on transversus abdominis/core activation and overall lumbar stability with good body mechanics for sit to stands and lifting. Cues to keep from R knee valgus and to keep trunk straight and not flex in thoracic/lumbar spine.     EVAL: Patient is a 53 y.o. female who was seen today for physical therapy evaluation and treatment for chronic low back and R knee pain 5 years s/p TKR.  Patient presents with functional  mobility and ROM in lumbar spine and R knee.  PSFS shows 50% perceived function.  Core weakness evident by struggles with bed mobility.  30s chair stand test finds BLE weakness  OBJECTIVE IMPAIRMENTS: decreased activity tolerance, decreased endurance, decreased knowledge of condition, decreased mobility, decreased strength, and pain.   ACTIVITY LIMITATIONS: lifting, bending, standing, squatting, stairs, and bed mobility  PERSONAL FACTORS: Age, Fitness, Past/current experiences, and Time since onset of injury/illness/exacerbation are also affecting patient's functional outcome.   REHAB POTENTIAL: Good  CLINICAL DECISION MAKING: Stable/uncomplicated  EVALUATION COMPLEXITY: Moderate   GOALS: Goals reviewed with patient? No  SHORT TERM GOALS: Target date: 09/08/2024    Patient to demonstrate independence in HEP  Baseline: TBD Goal status: INITIAL  2.  Assess and set goal Baseline: TBD Goal status: INITIAL   LONG TERM GOALS: Target date: 10/13/2024  Patient will acknowledge 4/10 pain at least once during episode of care   Baseline:  Goal status: INITIAL  2.  Patient will increase 30s chair stand reps from 6 to 10 with crossed arms to demonstrate and improved functional ability with less pain/difficulty as well as reduce fall risk.  Baseline:  Goal status: INITIAL  3.  Patient will score at least 20/30 on PSFS to signify clinically meaningful improvement in functional abilities.    Baseline: 15/30 Goal status: INITIAL  4.  Increase R knee strength to 4/5 Baseline: 4-/5 Goal status: INITIAL    PLAN:  PT FREQUENCY: 1-2x/week  PT DURATION: 6 weeks  PLANNED INTERVENTIONS: 97110-Therapeutic exercises, 97530- Therapeutic activity, [id number]- Neuromuscular re-education, 97535- Self Care, 02859- Manual therapy, [phone]- Gait training, Patient/Family education, Balance training, and Stair training.  PLAN FOR NEXT SESSION: HEP review and update, manual techniques as  appropriate, aerobic tasks, ROM and flexibility activities, strengthening and PREs, TPDN, gait and balance training as needed    For all possible CPT codes, reference the Planned Interventions line above.     Check all conditions that are expected to impact treatment: {Conditions expected to impact treatment:None of these apply   If treatment provided at initial evaluation, no treatment charged due to lack of authorization.       Darlette Dubow April Ma L Mayline Dragon,  PT, DPT 09/02/2024, 1:45 PM

## 2024-09-03 ENCOUNTER — Ambulatory Visit

## 2024-09-03 NOTE — Therapy (Unsigned)
 OUTPATIENT PHYSICAL THERAPY THORACOLUMBAR TREATMENT   Patient Name: Tami Martin MRN: [id number] DOB:November 23, 1971, 53 y.o., female Today's Date: 09/05/2024  END OF SESSION:  PT End of Session - 09/05/24 0827     Visit Number 4    Number of Visits 12    Date for Recertification  10/18/24    Authorization Type [place] [employer]    Authorization Time Period 27 visit limit for the whole year    Authorization - Number of Visits 27    PT Start Time 0830    PT Stop Time 0910    PT Time Calculation (min) 40 min    Activity Tolerance Patient tolerated treatment well    Behavior During Therapy [facility] for tasks assessed/performed            Past Medical History:  Diagnosis Date   Allergy    Seasonal allergies    DDD (degenerative disc disease), lumbosacral 2011   GERD (gastroesophageal reflux disease)    denies today 07/2016   Prediabetes 2014   Past Surgical History:  Procedure Laterality Date   KNEE SURGERY Right 2020   Patient Active Problem List   Diagnosis Date Noted   Chronic knee pain after total replacement of right knee joint 08/01/2024   Candidal skin infection 05/29/2024   Poor appetite 05/29/2024   Thrombocytosis 05/29/2024   Generalized weakness 04/14/2024   Dizziness 04/14/2024   Family history of catecholaminergic polymorphic ventricular tachycardia 04/14/2024   Tachycardia 04/14/2024   Lumbar spondylosis 04/14/2024   History of sciatica 04/14/2024   Abdominal pain 07/06/2022   Right lower quadrant abdominal tenderness with rebound tenderness 07/06/2022   Chronic bilateral low back pain with right-sided sciatica 03/09/2022   Hot flashes 09/16/2021   Pes planus 10/11/2017   Prediabetes 08/29/2016   GERD (gastroesophageal reflux disease) 08/29/2016   Acute intractable headache 01/22/2015   Encounter for general adult medical examination with abnormal findings 09/11/2014   Allergic rhinitis 06/10/2013   Sciatica neuralgia 03/04/2013    PCP: Rollene Almarie LABOR, MD  REFERRING PROVIDER: Rollene Almarie LABOR, MD  REFERRING DIAG: G89.29,M54.41 (ICD-10-CM) - Chronic bilateral low back pain with right-sided sciatica M25.561,G89.29,Z96.651 (ICD-10-CM) - Chronic knee pain after total replacement of right knee joint  Rationale for Evaluation and Treatment: Rehabilitation  THERAPY DIAG:  Chronic bilateral low back pain with right-sided sciatica  Chronic knee pain after total replacement of right knee joint  Muscle weakness (generalized)  ONSET DATE: chronic  SUBJECTIVE:  SUBJECTIVE STATEMENT: Reports improving symptoms.  Low back bothers her with prolonged weightbearing tasks.   EVAL: Patient arrives to [facility] with c/o chronic R knee and low back pain.  Symptoms ongoing over several years.  PERTINENT HISTORY:  Tami Martin is a 53 year old female who presents with worsening back pain and right knee pain post-replacement.   She has persistent back pain that has been worsening over time, exacerbated by sitting and moving, which makes daily activities challenging, especially with her children going to school. She completed a course of therapy and has one more session remaining. She uses meloxicam , recently refilled, and ibuprofen , but finds the latter ineffective.   Her right knee, which underwent replacement surgery five years ago, is causing significant persistent pain, although it does not give out. She has not seen the surgical team for follow-up since the surgery, as it has been a long time.   She experiences stomach pain radiating from the back to the front. No diarrhea, constipation, nausea, or vomiting, and she maintains a normal diet. A previous scan in May showed no abnormalities in the stomach or other organs. She is not currently taking any  acid-reducing medications.  PAIN:  Are you having pain? Yes: NPRS scale: 6/10 Pain location: R knee and low back Pain description: ache Aggravating factors: flexion activities, prolonged standing, arising from sit Relieving factors: undetermined  PRECAUTIONS: None  WEIGHT BEARING RESTRICTIONS: No  FALLS:  Has patient fallen in last 6 months? No   OCCUPATION: not working  PATIENT GOALS: To manage my pain  NEXT MD VISIT: 09/15/24 Dr. Joane  OBJECTIVE:  Note: Objective measures were completed at Evaluation unless otherwise noted.  DIAGNOSTIC FINDINGS:  Diagnostic Limited MSK Ultrasound of: Right knee Quad tendon intact normal. Patellar tendon is intact.  Joint line the edge of the prosthesis is visible no significant abnormalities visible Impression: Unremarkable MSK ultrasound examination of the knee status post knee replacement.   X-ray images lumbar spine and right knee obtained today personally and independently interpreted.   Lumbar spine: DDD L5-S1.  No acute fractures are visible.   Right knee: No fractures.  No hardware loosening visible.  PATIENT SURVEYS:  Patient-specific activity scoring scheme (Point to one number):  0 represents "unable to perform." 10 represents "able to perform at prior level. 0 1 2 3 4 5 6 7 8 9  10 (Date and Score) Activity Initial  Activity Eval     Standing from sitting  5    Stair climbing  6    Bed mobility  4    Total score = sum of the activity scores/number of activities Minimum detectable change (90%CI) for average score = 2 points Minimum detectable change (90%CI) for single activity score = 3 points PSFS developed by: Rosalee MYRTIS Marvis KYM Charlet CHRISTELLA., & Binkley, J. (1995). Assessing disability and change on individual  patients: a report of a patient specific measure. Physiotherapy [place], 47, [phone]. Reproduced with the permission of the authors  Score: 15/30  MUSCLE LENGTH: Hamstrings: Right 90 deg; Left  90 deg  LUMBAR ROM:   AROM eval  Flexion 90%  Extension 75%  Right lateral flexion 75%  Left lateral flexion 75%  Right rotation   Left rotation    (Blank rows = not tested)  LOWER EXTREMITY ROM:     Active  Right eval Left eval  Hip flexion    Hip extension    Hip abduction    Hip adduction    Hip  internal rotation    Hip external rotation    Knee flexion 125d   Knee extension 0d   Ankle dorsiflexion    Ankle plantarflexion    Ankle inversion    Ankle eversion     (Blank rows = not tested)  LOWER EXTREMITY MMT:    MMT Right eval Left eval  Hip flexion    Hip extension    Hip abduction    Hip adduction    Hip internal rotation    Hip external rotation    Knee flexion 4-   Knee extension 4-   Ankle dorsiflexion    Ankle plantarflexion    Ankle inversion    Ankle eversion     (Blank rows = not tested)  LUMBAR SPECIAL TESTS:  Straight leg raise test: Negative and Slump test: Negative  FUNCTIONAL TESTS:  30 seconds chair stand test 6 reps arms crossed  GAIT: Distance walked: 52ftx2 Assistive device utilized: None Level of assistance: Complete Independence Comments: unremarkable  TREATMENT:    [facility] Adult PT Treatment:                                                DATE: 09/05/24 Therapeutic Exercise: Nustep L4 8 min Neuromuscular re-ed: P-ball curl ups 15x B, 15/15 Supine hip fallouts GTB 15x B, 15/15  Bridge against GTB 15x Bridge with ball squeeze 15x Sit to stand 3Kg ball w/OH press 15x Therapeutic Activity: Seated hamstring stretch 30s x2 B Supine QL stretch 30s x2 B FAQs with ball squeeze 15x Seated hamstring curls R 15x GTB Heel raise from 4 in step 15x  [facility] Adult PT Treatment:                                                DATE: 09/02/2024 Therapeutic Exercise: NuStep L4 x 8' with LEs/UEs Seated pball flexion x30, with lateral flexion x 30 Seated hamstring stretch x 30 Seated hip adductor stretch x 30 Supine QL stretch  x30 Neuromuscular Re-ed: Supine PPT for TSA contraction 10x5 Supine TSA + clamshell red TB iso 2x10 Supine bridge with red TB around knees 10x5 SLR 2x10 Supine hip abd 2x10 Therapeutic Activity: Sit<>stand red TB around knees 2x10 cues to keep from R knee valgus and/or increased bilat foot/ankle pronation/ER Squat touch lifting 5# 2x10 weight focusing on maintaining trunk stability and decreasing thoracic/lumbar flexion    [facility] Adult PT Treatment:                                                DATE: 08/27/2024 Therapeutic Exercise: NuStep 8' Supine QL stretch 2x1' B Therapeutic Activity: Supine bridge on Pball 2x12, hold 3s HEP review, performance, and education Pt demonstrated and verbalized competence Bent knee fall out 2x12 B, hold 1s, GTB Pball rollout 2x15, hold 1s  [facility] Adult PT Treatment:                                                DATE: 08/18/24 Eval only(late for appointment)   PATIENT EDUCATION:  Education details: Discussed eval findings, rehab rationale and POC and patient is in agreement  Person educated: Patient Education method: Explanation and Handouts Education comprehension: verbalized understanding and needs further education  HOME EXERCISE PROGRAM: Access Code: [email or web address] URL: [email or web address] Date: 08/27/2024 Prepared by: Mabel Kiang  Exercises - Supine Quadratus Lumborum Stretch  - 1 x daily - 7 x weekly - 2-3 sets - 1 reps - 53m hold - Supine March  - 1 x daily - 7 x weekly - 3 sets - 10 reps - Supine Bridge with Resistance Band  - 1 x daily - 4 x weekly - 2-3 sets - 12 reps - 5s hold  ASSESSMENT:  CLINICAL IMPRESSION:  Focus of session was aerobic w/u followed by strengthening tasks to core, trunk and lumbosacral region.  Marked weakness in hips identified with hip fallouts and bridging tasks.  Some  difficulty performing more complex and subtle exercises due to language barrier, requiring frequent cuing for form and pacing.    EVAL: Patient is a 53 y.o. female who was seen today for physical therapy evaluation and treatment for chronic low back and R knee pain 5 years s/p TKR.  Patient presents with functional mobility and ROM in lumbar spine and R knee.  PSFS shows 50% perceived function.  Core weakness evident by struggles with bed mobility.  30s chair stand test finds BLE weakness  OBJECTIVE IMPAIRMENTS: decreased activity tolerance, decreased endurance, decreased knowledge of condition, decreased mobility, decreased strength, and pain.   ACTIVITY LIMITATIONS: lifting, bending, standing, squatting, stairs, and bed mobility  PERSONAL FACTORS: Age, Fitness, Past/current experiences, and Time since onset of injury/illness/exacerbation are also affecting patient's functional outcome.   REHAB POTENTIAL: Good  CLINICAL DECISION MAKING: Stable/uncomplicated  EVALUATION COMPLEXITY: Moderate   GOALS: Goals reviewed with patient? No  SHORT TERM GOALS: Target date: 09/08/2024    Patient to demonstrate independence in HEP  Baseline: TBD;  [id number] Goal status: Met  2.  Assess and set goal Baseline: TBD Goal status: INITIAL   LONG TERM GOALS: Target date: 10/13/2024  Patient will acknowledge 4/10 pain at least once during episode of care   Baseline:  Goal status: INITIAL  2.  Patient will increase 30s chair stand reps from 6 to 10 with crossed arms to demonstrate and improved functional ability with less pain/difficulty as well as reduce fall risk.  Baseline:  Goal status: INITIAL  3.  Patient will score at least 20/30 on PSFS to signify clinically meaningful improvement in functional abilities.    Baseline: 15/30 Goal status: INITIAL  4.  Increase R knee strength to 4/5 Baseline: 4-/5 Goal status: INITIAL    PLAN:  PT FREQUENCY: 1-2x/week  PT DURATION: 6  weeks  PLANNED INTERVENTIONS: 97110-Therapeutic exercises, 97530- Therapeutic activity, [id number]- Neuromuscular re-education, 97535- Self Care, 02859- Manual therapy, [id number]- Gait training, Patient/Family education, Balance training, and Stair training.  PLAN FOR NEXT SESSION: HEP review and update, manual techniques as appropriate, aerobic tasks, ROM and flexibility activities, strengthening and PREs, TPDN, gait and balance training as needed    For all possible CPT codes, reference the Planned Interventions line above.  Check all conditions that are expected to impact treatment: {Conditions expected to impact treatment:None of these apply   If treatment provided at initial evaluation, no treatment charged due to lack of authorization.       Ash Mcelwain M Tujuana Kilmartin, PT 09/05/2024, 9:15 AM

## 2024-09-05 ENCOUNTER — Ambulatory Visit: Attending: Internal Medicine

## 2024-09-05 DIAGNOSIS — M6281 Muscle weakness (generalized): Secondary | ICD-10-CM | POA: Diagnosis present

## 2024-09-05 DIAGNOSIS — M25561 Pain in right knee: Secondary | ICD-10-CM | POA: Diagnosis present

## 2024-09-05 DIAGNOSIS — G8929 Other chronic pain: Secondary | ICD-10-CM | POA: Insufficient documentation

## 2024-09-05 DIAGNOSIS — M5441 Lumbago with sciatica, right side: Secondary | ICD-10-CM | POA: Insufficient documentation

## 2024-09-05 DIAGNOSIS — Z96651 Presence of right artificial knee joint: Secondary | ICD-10-CM | POA: Insufficient documentation

## 2024-09-08 ENCOUNTER — Ambulatory Visit

## 2024-09-09 ENCOUNTER — Ambulatory Visit

## 2024-09-09 NOTE — Therapy (Incomplete)
 OUTPATIENT PHYSICAL THERAPY THORACOLUMBAR TREATMENT   Patient Name: Tami Martin MRN: [id number] DOB:11/07/1971, 53 y.o., female Today's Date: 09/09/2024  END OF SESSION:  ***    Past Medical History:  Diagnosis Date   Allergy    Seasonal allergies    DDD (degenerative disc disease), lumbosacral 2011   GERD (gastroesophageal reflux disease)    denies today 07/2016   Prediabetes 2014   Past Surgical History:  Procedure Laterality Date   KNEE SURGERY Right 2020   Patient Active Problem List   Diagnosis Date Noted   Chronic knee pain after total replacement of right knee joint 08/01/2024   Candidal skin infection 05/29/2024   Poor appetite 05/29/2024   Thrombocytosis 05/29/2024   Generalized weakness 04/14/2024   Dizziness 04/14/2024   Family history of catecholaminergic polymorphic ventricular tachycardia 04/14/2024   Tachycardia 04/14/2024   Lumbar spondylosis 04/14/2024   History of sciatica 04/14/2024   Abdominal pain 07/06/2022   Right lower quadrant abdominal tenderness with rebound tenderness 07/06/2022   Chronic bilateral low back pain with right-sided sciatica 03/09/2022   Hot flashes 09/16/2021   Pes planus 10/11/2017   Prediabetes 08/29/2016   GERD (gastroesophageal reflux disease) 08/29/2016   Acute intractable headache 01/22/2015   Encounter for general adult medical examination with abnormal findings 09/11/2014   Allergic rhinitis 06/10/2013   Sciatica neuralgia 03/04/2013    PCP: Tami Almarie LABOR, MD  REFERRING PROVIDER: Rollene Almarie LABOR, MD  REFERRING DIAG: G89.29,M54.41 (ICD-10-CM) - Chronic bilateral low back pain with right-sided sciatica M25.561,G89.29,Z96.651 (ICD-10-CM) - Chronic knee pain after total replacement of right knee joint  Rationale for Evaluation and Treatment: Rehabilitation  THERAPY DIAG:  No diagnosis found.  ONSET DATE: chronic  SUBJECTIVE:                                                                                                                                                                                            SUBJECTIVE STATEMENT:  *** Reports improving symptoms.  Low back bothers her with prolonged weightbearing tasks.   EVAL: Patient arrives to OPPT with c/o chronic R knee and low back pain.  Symptoms ongoing over several years.  PERTINENT HISTORY:  Tami Martin is a 53 year old female who presents with worsening back pain and right knee pain post-replacement.   She has persistent back pain that has been worsening over time, exacerbated by sitting and moving, which makes daily activities challenging, especially with her children going to school. She completed a course of therapy and has one more session remaining. She uses meloxicam , recently refilled, and ibuprofen , but finds the latter ineffective.  Her right knee, which underwent replacement surgery five years ago, is causing significant persistent pain, although it does not give out. She has not seen the surgical team for follow-up since the surgery, as it has been a long time.   She experiences stomach pain radiating from the back to the front. No diarrhea, constipation, nausea, or vomiting, and she maintains a normal diet. A previous scan in May showed no abnormalities in the stomach or other organs. She is not currently taking any acid-reducing medications.  PAIN:  Are you having pain? Yes: NPRS scale: 6/10 Pain location: R knee and low back Pain description: ache Aggravating factors: flexion activities, prolonged standing, arising from sit Relieving factors: undetermined  PRECAUTIONS: None  WEIGHT BEARING RESTRICTIONS: No  FALLS:  Has patient fallen in last 6 months? No   OCCUPATION: not working  PATIENT GOALS: To manage my pain  NEXT MD VISIT: 09/15/24 Tami Martin  OBJECTIVE:  Note: Objective measures were completed at Evaluation unless otherwise noted.  DIAGNOSTIC FINDINGS:  Diagnostic  Limited MSK Ultrasound of: Right knee Quad tendon intact normal. Patellar tendon is intact.  Joint line the edge of the prosthesis is visible no significant abnormalities visible Impression: Unremarkable MSK ultrasound examination of the knee status post knee replacement.   X-ray images lumbar spine and right knee obtained today personally and independently interpreted.   Lumbar spine: DDD L5-S1.  No acute fractures are visible.   Right knee: No fractures.  No hardware loosening visible.  PATIENT SURVEYS:  Patient-specific activity scoring scheme (Point to one number):  0 represents "unable to perform." 10 represents "able to perform at prior level. 0 1 2 3 4 5 6 7 8 9  10 (Date and Score) Activity Initial  Activity Eval     Standing from sitting  5    Stair climbing  6    Bed mobility  4    Total score = sum of the activity scores/number of activities Minimum detectable change (90%CI) for average score = 2 points Minimum detectable change (90%CI) for single activity score = 3 points PSFS developed by: Tami Martin., & Tami Martin, J. (1995). Assessing disability and change on individual  patients: a report of a patient specific measure. Physiotherapy [place], 47, [phone]. Reproduced with the permission of the authors  Score: 15/30  MUSCLE LENGTH: Hamstrings: Right 90 deg; Left 90 deg  LUMBAR ROM:   AROM eval  Flexion 90%  Extension 75%  Right lateral flexion 75%  Left lateral flexion 75%  Right rotation   Left rotation    (Blank rows = not tested)  LOWER EXTREMITY ROM:     Active  Right eval Left eval  Hip flexion    Hip extension    Hip abduction    Hip adduction    Hip internal rotation    Hip external rotation    Knee flexion 125d   Knee extension 0d   Ankle dorsiflexion    Ankle plantarflexion    Ankle inversion    Ankle eversion     (Blank rows = not tested)  LOWER EXTREMITY MMT:    MMT Right eval Left eval  Hip flexion     Hip extension    Hip abduction    Hip adduction    Hip internal rotation    Hip external rotation    Knee flexion 4-   Knee extension 4-   Ankle dorsiflexion    Ankle plantarflexion    Ankle inversion  Ankle eversion     (Blank rows = not tested)  LUMBAR SPECIAL TESTS:  Straight leg raise test: Negative and Slump test: Negative  FUNCTIONAL TESTS:  30 seconds chair stand test 6 reps arms crossed  GAIT: Distance walked: 59ftx2 Assistive device utilized: None Level of assistance: Complete Independence Comments: unremarkable  TREATMENT:    TREATMENT 09/09/2024:  Therapeutic Exercise: - ***  Manual Therapy: - ***  Neuromuscular re-ed: - ***  Therapeutic Activity: - ***  Self-care/Home Management: [facility] [employer] Adult PT Treatment:                                                DATE: 09/05/24 Therapeutic Exercise: Nustep L4 8 min Neuromuscular re-ed: P-ball curl ups 15x B, 15/15 Supine hip fallouts GTB 15x B, 15/15  Bridge against GTB 15x Bridge with ball squeeze 15x Sit to stand 3Kg ball w/OH press 15x Therapeutic Activity: Seated hamstring stretch 30s x2 B Supine QL stretch 30s x2 B FAQs with ball squeeze 15x Seated hamstring curls R 15x GTB Heel raise from 4 in step 15x  [facility] Adult PT Treatment:                                                DATE: 09/02/2024 Therapeutic Exercise: NuStep L4 x 8' with LEs/UEs Seated pball flexion x30, with lateral flexion x 30 Seated hamstring stretch x 30 Seated hip adductor stretch x 30 Supine QL stretch x30 Neuromuscular Re-ed: Supine PPT for TSA contraction 10x5 Supine TSA + clamshell red TB iso 2x10 Supine bridge with red TB around knees 10x5 SLR 2x10 Supine hip abd 2x10 Therapeutic Activity: Sit<>stand red TB around knees 2x10 cues to keep from R knee valgus and/or increased bilat foot/ankle pronation/ER Squat touch lifting 5# 2x10 weight focusing on maintaining trunk stability and decreasing  thoracic/lumbar flexion                                                                                                                               PATIENT EDUCATION:  Education details: Discussed eval findings, rehab rationale and POC and patient is in agreement  Person educated: Patient Education method: Explanation and Handouts Education comprehension: verbalized understanding and needs further education  HOME EXERCISE PROGRAM: Access Code: [email or web address] URL: [email or web address] Date: 08/27/2024 Prepared by: Mabel Kiang  Exercises - Supine Quadratus Lumborum Stretch  - 1 x daily - 7 x weekly - 2-3 sets - 1 reps - 19m hold - Supine March  - 1 x daily - 7 x weekly - 3 sets - 10 reps - Supine Bridge with Resistance Band  - 1 x daily - 4 x weekly - 2-3 sets -  12 reps - 5s hold  ASSESSMENT:  ***  CLINICAL IMPRESSION:  Focus of session was aerobic w/u followed by strengthening tasks to core, trunk and lumbosacral region.  Marked weakness in hips identified with hip fallouts and bridging tasks.  Some difficulty performing more complex and subtle exercises due to language barrier, requiring frequent cuing for form and pacing.    EVAL: Patient is a 53 y.o. female who was seen today for physical therapy evaluation and treatment for chronic low back and R knee pain 5 years s/p TKR.  Patient presents with functional mobility and ROM in lumbar spine and R knee.  PSFS shows 50% perceived function.  Core weakness evident by struggles with bed mobility.  30s chair stand test finds BLE weakness  OBJECTIVE IMPAIRMENTS: decreased activity tolerance, decreased endurance, decreased knowledge of condition, decreased mobility, decreased strength, and pain.   ACTIVITY LIMITATIONS: lifting, bending, standing, squatting, stairs, and bed mobility  PERSONAL FACTORS: Age, Fitness, Past/current experiences, and Time since onset of injury/illness/exacerbation are also affecting patient's  functional outcome.   REHAB POTENTIAL: Good  CLINICAL DECISION MAKING: Stable/uncomplicated  EVALUATION COMPLEXITY: Moderate   GOALS: Goals reviewed with patient? No  SHORT TERM GOALS: Target date: 09/08/2024    Patient to demonstrate independence in HEP  Baseline: TBD;  [id number] Goal status: Met  2.  Assess and set goal Baseline: TBD Goal status: INITIAL   LONG TERM GOALS: Target date: 10/13/2024  Patient will acknowledge 4/10 pain at least once during episode of care   Baseline:  Goal status: INITIAL  2.  Patient will increase 30s chair stand reps from 6 to 10 with crossed arms to demonstrate and improved functional ability with less pain/difficulty as well as reduce fall risk.  Baseline:  Goal status: INITIAL  3.  Patient will score at least 20/30 on PSFS to signify clinically meaningful improvement in functional abilities.    Baseline: 15/30 Goal status: INITIAL  4.  Increase R knee strength to 4/5 Baseline: 4-/5 Goal status: INITIAL    PLAN:  PT FREQUENCY: 1-2x/week  PT DURATION: 6 weeks  PLANNED INTERVENTIONS: 97110-Therapeutic exercises, 97530- Therapeutic activity, [id number]- Neuromuscular re-education, 97535- Self Care, 02859- Manual therapy, [phone]- Gait training, Patient/Family education, Balance training, and Stair training.  PLAN FOR NEXT SESSION: HEP review and update, manual techniques as appropriate, aerobic tasks, ROM and flexibility activities, strengthening and PREs, TPDN, gait and balance training as needed    For all possible CPT codes, reference the Planned Interventions line above.     Check all conditions that are expected to impact treatment: {Conditions expected to impact treatment:None of these apply   If treatment provided at initial evaluation, no treatment charged due to lack of authorization.       [place] Greener  Donyel Nester, PT 09/09/2024, 10:11 AM

## 2024-09-10 ENCOUNTER — Ambulatory Visit

## 2024-09-10 DIAGNOSIS — G8929 Other chronic pain: Secondary | ICD-10-CM | POA: Diagnosis not present

## 2024-09-10 NOTE — Therapy (Addendum)
 " OUTPATIENT PHYSICAL THERAPY THORACOLUMBAR TREATMENT  PHYSICAL THERAPY UNPLANNED DISCHARGE SUMMARY   Visits from Start of Care: 5  Current functional level related to goals / functional outcomes: Current status unknown   Remaining deficits: Current status unknown   Education / Equipment: Pt has not returned since visit listed below  Patient goals were not assessed. Patient is being discharged due to not returning since the last visit.  (the note below was addended to include the above D/C summary on 12/31/24)  Patient Name: Tami Martin MRN: [id number] DOB:12-04-1971, 53 y.o., female Today's Date: 09/10/2024  END OF SESSION:  PT End of Session - 09/10/24 1016     Visit Number 5    Number of Visits 12    Date for Recertification  10/18/24    Authorization Type [employer]    Authorization Time Period 27 visit limit for the whole year    Authorization - Number of Visits 27    PT Start Time 1015    PT Stop Time 1100    PT Time Calculation (min) 45 min    Activity Tolerance Patient tolerated treatment well    Behavior During Therapy [facility] for tasks assessed/performed             Past Medical History:  Diagnosis Date   Allergy    Seasonal allergies    DDD (degenerative disc disease), lumbosacral 2011   GERD (gastroesophageal reflux disease)    denies today 07/2016   Prediabetes 2014   Past Surgical History:  Procedure Laterality Date   KNEE SURGERY Right 2020   Patient Active Problem List   Diagnosis Date Noted   Chronic knee pain after total replacement of right knee joint 08/01/2024   Candidal skin infection 05/29/2024   Poor appetite 05/29/2024   Thrombocytosis 05/29/2024   Generalized weakness 04/14/2024   Dizziness 04/14/2024   Family history of catecholaminergic polymorphic ventricular tachycardia 04/14/2024   Tachycardia 04/14/2024   Lumbar spondylosis 04/14/2024   History of sciatica 04/14/2024   Abdominal pain 07/06/2022   Right lower  quadrant abdominal tenderness with rebound tenderness 07/06/2022   Chronic bilateral low back pain with right-sided sciatica 03/09/2022   Hot flashes 09/16/2021   Pes planus 10/11/2017   Prediabetes 08/29/2016   GERD (gastroesophageal reflux disease) 08/29/2016   Acute intractable headache 01/22/2015   Encounter for general adult medical examination with abnormal findings 09/11/2014   Allergic rhinitis 06/10/2013   Sciatica neuralgia 03/04/2013    PCP: Rollene Almarie LABOR, MD  REFERRING PROVIDER: Rollene Almarie LABOR, MD  REFERRING DIAG: G89.29,M54.41 (ICD-10-CM) - Chronic bilateral low back pain with right-sided sciatica M25.561,G89.29,Z96.651 (ICD-10-CM) - Chronic knee pain after total replacement of right knee joint  Rationale for Evaluation and Treatment: Rehabilitation  THERAPY DIAG:  Chronic bilateral low back pain with right-sided sciatica  Chronic right-sided low back pain with right-sided sciatica  ONSET DATE: chronic  SUBJECTIVE:  SUBJECTIVE STATEMENT:  Feeling better today    Reports improving symptoms.  Low back bothers her with prolonged weightbearing tasks.   EVAL: Patient arrives to OPPT with c/o chronic R knee and low back pain.  Symptoms ongoing over several years.  PERTINENT HISTORY:  Lynlee F Ruland is a 53 year old female who presents with worsening back pain and right knee pain post-replacement.   She has persistent back pain that has been worsening over time, exacerbated by sitting and moving, which makes daily activities challenging, especially with her children going to school. She completed a course of therapy and has one more session remaining. She uses meloxicam , recently refilled, and ibuprofen , but finds the latter ineffective.   Her right knee, which  underwent replacement surgery five years ago, is causing significant persistent pain, although it does not give out. She has not seen the surgical team for follow-up since the surgery, as it has been a long time.   She experiences stomach pain radiating from the back to the front. No diarrhea, constipation, nausea, or vomiting, and she maintains a normal diet. A previous scan in May showed no abnormalities in the stomach or other organs. She is not currently taking any acid-reducing medications.  PAIN: 5/10 today Are you having pain? Yes: NPRS scale: 6/10 Pain location: R knee and low back Pain description: ache Aggravating factors: flexion activities, prolonged standing, arising from sit Relieving factors: undetermined  PRECAUTIONS: None  WEIGHT BEARING RESTRICTIONS: No  FALLS:  Has patient fallen in last 6 months? No   OCCUPATION: not working  PATIENT GOALS: To manage my pain  NEXT MD VISIT: 09/15/24 Dr. Joane  OBJECTIVE:  Note: Objective measures were completed at Evaluation unless otherwise noted.  DIAGNOSTIC FINDINGS:  Diagnostic Limited MSK Ultrasound of: Right knee Quad tendon intact normal. Patellar tendon is intact.  Joint line the edge of the prosthesis is visible no significant abnormalities visible Impression: Unremarkable MSK ultrasound examination of the knee status post knee replacement.   X-ray images lumbar spine and right knee obtained today personally and independently interpreted.   Lumbar spine: DDD L5-S1.  No acute fractures are visible.   Right knee: No fractures.  No hardware loosening visible.  PATIENT SURVEYS:  Patient-specific activity scoring scheme (Point to one number):  0 represents unable to perform. 10 represents able to perform at prior level. 0 1 2 3 4 5 6 7 8 9  10 (Date and Score) Activity Initial  Activity Eval     Standing from sitting  5    Stair climbing  6    Bed mobility  4    Total score = sum of the activity  scores/number of activities Minimum detectable change (90%CI) for average score = 2 points Minimum detectable change (90%CI) for single activity score = 3 points PSFS developed by: Rosalee MYRTIS Marvis KYM Charlet CHRISTELLA., & Binkley, J. (1995). Assessing disability and change on individual  patients: a report of a patient specific measure. Physiotherapy [place], 47, [phone]. Reproduced with the permission of the authors  Score: 15/30  MUSCLE LENGTH: Hamstrings: Right 90 deg; Left 90 deg  LUMBAR ROM:   AROM eval  Flexion 90%  Extension 75%  Right lateral flexion 75%  Left lateral flexion 75%  Right rotation   Left rotation    (Blank rows = not tested)  LOWER EXTREMITY ROM:     Active  Right eval Left eval  Hip flexion    Hip extension    Hip abduction  Hip adduction    Hip internal rotation    Hip external rotation    Knee flexion 125d   Knee extension 0d   Ankle dorsiflexion    Ankle plantarflexion    Ankle inversion    Ankle eversion     (Blank rows = not tested)  LOWER EXTREMITY MMT:    MMT Right eval Left eval  Hip flexion    Hip extension    Hip abduction    Hip adduction    Hip internal rotation    Hip external rotation    Knee flexion 4-   Knee extension 4-   Ankle dorsiflexion    Ankle plantarflexion    Ankle inversion    Ankle eversion     (Blank rows = not tested)  LUMBAR SPECIAL TESTS:  Straight leg raise test: Negative and Slump test: Negative  FUNCTIONAL TESTS:  30 seconds chair stand test 6 reps arms crossed  GAIT: Distance walked: 31ftx2 Assistive device utilized: None Level of assistance: Complete Independence Comments: unremarkable  TREATMENT:    TREATMENT 09/10/2024:  Therapeutic Exercise: Supine LTR 2x10 PPT 2x8x3s (required cuing) Glute bridge 2x8x3s Seated HS curl 2x10x3s 25# Green MB lumbar flexion rollout 2x8x3s Seated lumbar flexion x10 Supine alternating hip marches x8 (required cuing) Green TB paloff press  x8   [facility] Adult PT Treatment:                                                DATE: 09/05/24 Therapeutic Exercise: Nustep L4 8 min Neuromuscular re-ed: P-ball curl ups 15x B, 15/15 Supine hip fallouts GTB 15x B, 15/15  Bridge against GTB 15x Bridge with ball squeeze 15x Sit to stand 3Kg ball w/OH press 15x Therapeutic Activity: Seated hamstring stretch 30s x2 B Supine QL stretch 30s x2 B FAQs with ball squeeze 15x Seated hamstring curls R 15x GTB Heel raise from 4 in step 15x  [facility] Adult PT Treatment:                                                DATE: 09/02/2024 Therapeutic Exercise: NuStep L4 x 8' with LEs/UEs Seated pball flexion x30, with lateral flexion x 30 Seated hamstring stretch x 30 Seated hip adductor stretch x 30 Supine QL stretch x30 Neuromuscular Re-ed: Supine PPT for TSA contraction 10x5 Supine TSA + clamshell red TB iso 2x10 Supine bridge with red TB around knees 10x5 SLR 2x10 Supine hip abd 2x10 Therapeutic Activity: Sit<>stand red TB around knees 2x10 cues to keep from R knee valgus and/or increased bilat foot/ankle pronation/ER Squat touch lifting 5# 2x10 weight focusing on maintaining trunk stability and decreasing thoracic/lumbar flexion  PATIENT EDUCATION:  Education details: Discussed eval findings, rehab rationale and POC and patient is in agreement  Person educated: Patient Education method: Explanation and Handouts Education comprehension: verbalized understanding and needs further education  HOME EXERCISE PROGRAM: Access Code: [email or web address] URL: [email or web address] Date: 08/27/2024 Prepared by: Mabel Kiang  Exercises - Supine Quadratus Lumborum Stretch  - 1 x daily - 7 x weekly - 2-3 sets - 1 reps - 86m hold - Supine March  - 1 x daily - 7 x weekly - 3 sets - 10 reps - Supine Bridge with  Resistance Band  - 1 x daily - 4 x weekly - 2-3 sets - 12 reps - 5s hold  ASSESSMENT:   CLINICAL IMPRESSION:  Patient tolerated treatment well with no significant increases in pain w/ progressions of loading lumbar extensors, BL hips, and core. Due to language barrier, patient needed constant cuing for exercise form and pacing. Pt would continue to benefit from skilled PT to improve pain levels, activity tolerance, and functional strength.     EVAL: Patient is a 53 y.o. female who was seen today for physical therapy evaluation and treatment for chronic low back and R knee pain 5 years s/p TKR.  Patient presents with functional mobility and ROM in lumbar spine and R knee.  PSFS shows 50% perceived function.  Core weakness evident by struggles with bed mobility.  30s chair stand test finds BLE weakness  OBJECTIVE IMPAIRMENTS: decreased activity tolerance, decreased endurance, decreased knowledge of condition, decreased mobility, decreased strength, and pain.   ACTIVITY LIMITATIONS: lifting, bending, standing, squatting, stairs, and bed mobility  PERSONAL FACTORS: Age, Fitness, Past/current experiences, and Time since onset of injury/illness/exacerbation are also affecting patient's functional outcome.   REHAB POTENTIAL: Good  CLINICAL DECISION MAKING: Stable/uncomplicated  EVALUATION COMPLEXITY: Moderate   GOALS: Goals reviewed with patient? No  SHORT TERM GOALS: Target date: 09/08/2024    Patient to demonstrate independence in HEP  Baseline: TBD;  [id number] Goal status: Met  2.  Assess and set goal Baseline: TBD Goal status: INITIAL   LONG TERM GOALS: Target date: 10/13/2024  Patient will acknowledge 4/10 pain at least once during episode of care   Baseline:  Goal status: INITIAL  2.  Patient will increase 30s chair stand reps from 6 to 10 with crossed arms to demonstrate and improved functional ability with less pain/difficulty as well as reduce fall risk.  Baseline:   Goal status: INITIAL  3.  Patient will score at least 20/30 on PSFS to signify clinically meaningful improvement in functional abilities.    Baseline: 15/30 Goal status: INITIAL  4.  Increase R knee strength to 4/5 Baseline: 4-/5 Goal status: INITIAL    PLAN: HEP assessment and progression, symptom modulation, and loading (isolated/functional). Manual therapy, aerobic, gait, and NME training as needed.    PT FREQUENCY: 1-2x/week  PT DURATION: 6 weeks  PLANNED INTERVENTIONS: 97110-Therapeutic exercises, 97530- Therapeutic activity, [id number]- Neuromuscular re-education, 97535- Self Care, 02859- Manual therapy, [phone]- Gait training, Patient/Family education, Balance training, and Stair training.  PLAN FOR NEXT SESSION: HEP review and update, manual techniques as appropriate, aerobic tasks, ROM and flexibility activities, strengthening and PREs, TPDN, gait and balance training as needed    For all possible CPT codes, reference the Planned Interventions line above.     Check all conditions that are expected to impact treatment: {Conditions expected to impact treatment:None of these apply   If treatment provided at initial evaluation, no treatment charged due to lack of authorization.  [place] Odessia Scot  PT, DPT  "

## 2024-09-15 ENCOUNTER — Encounter: Payer: Self-pay | Admitting: Family Medicine

## 2024-09-15 ENCOUNTER — Ambulatory Visit: Admitting: Family Medicine

## 2024-09-15 ENCOUNTER — Ambulatory Visit (INDEPENDENT_AMBULATORY_CARE_PROVIDER_SITE_OTHER)

## 2024-09-15 VITALS — BP 136/84 | HR 76 | Ht 66.0 in | Wt 204.0 lb

## 2024-09-15 DIAGNOSIS — R079 Chest pain, unspecified: Secondary | ICD-10-CM | POA: Diagnosis not present

## 2024-09-15 DIAGNOSIS — G8929 Other chronic pain: Secondary | ICD-10-CM | POA: Diagnosis not present

## 2024-09-15 DIAGNOSIS — M25561 Pain in right knee: Secondary | ICD-10-CM | POA: Diagnosis not present

## 2024-09-15 NOTE — Progress Notes (Signed)
   I, Leotis Batter, CMA acting as a scribe for Artist Lloyd, MD.  Tami Martin is a 53 y.o. female who presents to [facility] at [facility] today for follow-up of right lower back and right knee pain. Pt was taking Meloxicam  prn after a fall, injuring the right knee. Pt was last seen by Dr. Lloyd on 08/14/24, XR of the lower back and right knee obtained. Pt prescribed course of Prednisone  and Gabapentin . Pt was also referred to physical therapy, completing 5 visits.   Today, patient reports some improvement of lower back pain. Sx wax and wane. Sx worse with prolonged standing. Occasional weakness in the leg. Also c/o n/t in the leg. Completed steroid taper. Notes some GI upset wit Gabapentin . Some improvement of right knee sx. Mild pain in the right knee, but notes improvement over all.   Also notes new left-sided chest wall and arm pain x 2 days. Short-term improvement with drinking cold water.  She denies any pain worsening with exertion shortness of breath or inability to exert herself.   Pertinent review of systems: No fevers or chills  Relevant historical information: No personal history of heart disease.   Exam:  BP 136/84   Pulse 76   Ht 5' 6 (1.676 m)   Wt 204 lb (92.5 kg)   LMP 01/02/2023   SpO2 97%   BMI 32.93 kg/m  General: Well Developed, well nourished, and in no acute distress.   Lungs: Clear to auscultation bilaterally.  Normal work of breathing. Heart regular rate and rhythm no murmur rubs or gallops Chest wall is mildly tender to palpation.  Lab and Radiology Results  Chest x-ray images obtained today personally and independently interpreted. No acute findings.  Await formal radiology review  Twelve-lead EKG obtained normal sinus rhythm no significant abnormal findings.     Assessment and Plan: 53 y.o. female with left chest wall pain ongoing for about 2 days.  Chest pain is nonexertional.  Testing in clinic is reassuring.  I  am unable to fully evaluate chest pain in the setting.  We had a discussion.  For now we will refer to cardiology for risk factor stratification however if worsening she should go to the emergency room.  As for her prior musculoskeletal pain she is improving with physical therapy.  Plan for watchful waiting for now.   PDMP not reviewed this encounter. Orders Placed This Encounter  Procedures   DG Chest 2 View    Standing Status:   Future    Number of Occurrences:   1    Expiration Date:   09/15/2025    Reason for Exam (SYMPTOM  OR DIAGNOSIS REQUIRED):   eval left chest pain    Is patient pregnant?:   No    Preferred imaging location?:   [facility]   Ambulatory referral to Cardiology    Referral Priority:   Routine    Referral Type:   Consultation    Referral Reason:   Specialty Services Required    Number of Visits Requested:   1   EKG 12-Lead   No orders of the defined types were placed in this encounter.    Discussed warning signs or symptoms. Please see discharge instructions. Patient expresses understanding.   The above documentation has been reviewed and is accurate and complete Artist Lloyd, M.D.

## 2024-09-15 NOTE — Patient Instructions (Signed)
 Thank you for coming in today.   You should hear from cardiology soon .   Please get an Xray today before you leave   If you get worse please go to the ER.

## 2024-09-16 ENCOUNTER — Ambulatory Visit: Payer: Self-pay | Admitting: Family Medicine

## 2024-09-16 ENCOUNTER — Ambulatory Visit

## 2024-09-16 NOTE — Progress Notes (Signed)
Chest x-ray looks normal to radiology.

## 2024-09-18 ENCOUNTER — Ambulatory Visit: Attending: Cardiology | Admitting: Cardiology

## 2024-09-18 ENCOUNTER — Encounter: Admitting: Physical Therapy

## 2024-09-18 ENCOUNTER — Encounter: Payer: Self-pay | Admitting: Cardiology

## 2024-09-18 VITALS — BP 133/84 | HR 77 | Resp 16 | Ht 66.0 in | Wt 202.0 lb

## 2024-09-18 DIAGNOSIS — K219 Gastro-esophageal reflux disease without esophagitis: Secondary | ICD-10-CM | POA: Insufficient documentation

## 2024-09-18 DIAGNOSIS — R072 Precordial pain: Secondary | ICD-10-CM | POA: Diagnosis not present

## 2024-09-18 MED ORDER — METOPROLOL TARTRATE 100 MG PO TABS: ORAL_TABLET | ORAL | 0 refills | Status: DC

## 2024-09-18 NOTE — Patient Instructions (Addendum)
 Medication Instructions:  Start OTC Tums as needed Start OTC Asprin 81 mg daily Metoprolol Tartrate 100 mg take 1 tablet 2 hours prior to cardiac ct.  *If you need a refill on your cardiac medications before your next appointment, please call your pharmacy*  Lab Work: NONE ordered at this time of appointment   Testing/Procedures: Your physician has requested that you have an echocardiogram. Echocardiography is a painless test that uses sound waves to create images of your heart. It provides your doctor with information about the size and shape of your heart and how well your heart's chambers and valves are working. This procedure takes approximately one hour. There are no restrictions for this procedure. Please do NOT wear cologne, perfume, aftershave, or lotions (deodorant is allowed). Please arrive 15 minutes prior to your appointment time.  Please note: We ask at that you not bring children with you during ultrasound (echo/ vascular) testing. Due to room size and safety concerns, children are not allowed in the ultrasound rooms during exams. Our front office staff cannot provide observation of children in our lobby area while testing is being conducted. An adult accompanying a patient to their appointment will only be allowed in the ultrasound room at the discretion of the ultrasound technician under special circumstances. We apologize for any inconvenience.    Your cardiac CT will be scheduled at one of the below locations:   [facility] [address]  [place], [place] [address] [phone]  If scheduled at the Heart and Vascular Tower at [facility] street, please enter the parking lot using the [address] entrance and use the FREE valet service at the patient drop-off area. Enter the building and check-in with registration on the main floor. Please follow these instructions carefully (unless otherwise directed):  An IV will be required for this  test and Nitroglycerin will be given.  Hold all erectile dysfunction medications at least 3 days (72 hrs) prior to test. (Ie viagra, cialis, sildenafil, tadalafil, etc)   On the Night Before the Test: Be sure to Drink plenty of water. Do not consume any caffeinated/decaffeinated beverages or chocolate 12 hours prior to your test. Do not take any antihistamines 12 hours prior to your test.   On the Day of the Test: Drink plenty of water until 1 hour prior to the test. Do not eat any food 1 hour prior to test. You may take your regular medications prior to the test.  Take metoprolol (Lopressor) two hours prior to test. If you take Furosemide/Hydrochlorothiazide/Spironolactone/Chlorthalidone, please HOLD on the morning of the test. Patients who wear a continuous glucose monitor MUST remove the device prior to scanning. FEMALES- please wear underwire-free bra if available, avoid dresses & tight clothing      After the Test: Drink plenty of water. After receiving IV contrast, you may experience a mild flushed feeling. This is normal. On occasion, you may experience a mild rash up to 24 hours after the test. This is not dangerous. If this occurs, you can take Benadryl 25 mg, Zyrtec , Claritin, or Allegra and increase your fluid intake. (Patients taking Tikosyn should avoid Benadryl, and may take Zyrtec , Claritin, or Allegra) If you experience trouble breathing, this can be serious. If it is severe call 911 IMMEDIATELY. If it is mild, please call our office.  We will call to schedule your test 2-4 weeks out understanding that some insurance companies will need an authorization prior to the service being performed.   For  more information and frequently asked questions, please visit our website : [email or web address]  For non-scheduling related questions, please contact the cardiac imaging nurse navigator should you have any questions/concerns: Cardiac Imaging Nurse Navigators Direct  Office Dial: [phone]   For scheduling needs, including cancellations and rescheduling, please call [place], [phone].    Follow-Up: At [facility], you and your health needs are our priority.  As part of our continuing mission to provide you with exceptional heart care, our providers are all part of one team.  This team includes your primary Cardiologist (physician) and Advanced Practice Providers or APPs (Physician Assistants and Nurse Practitioners) who all work together to provide you with the care you need, when you need it.  Your next appointment:   3 month(s)  Provider:   Dr. Floretta     We recommend signing up for the patient portal called MyChart.  Sign up information is provided on this After Visit Summary.  MyChart is used to connect with patients for Virtual Visits (Telemedicine).  Patients are able to view lab/test results, encounter notes, upcoming appointments, etc.  Non-urgent messages can be sent to your provider as well.   To learn more about what you can do with MyChart, go to [email or web address].

## 2024-09-18 NOTE — Progress Notes (Signed)
 Cardiology Office Note   Date:  09/18/2024  ID:  Tami Martin, DOB 04/15/1971, MRN [id number] PCP: Tami Almarie LABOR, MD  [facility] Providers Cardiologist:  None   History of Present Illness Tami Martin is a 53 y.o. female with a past medical history of back pain, knee pain.  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  She has been following with her primary care provider for ongoing knee pain.  Reported having chest pain was referred to cardiology.  Patient tells me today that her chest pain has been going on for about 1 week.  Is intermittent and occurs multiple times per day.  Describes a pressure-like feeling in her chest.  This occurs randomly whether she is resting or exerting herself.  Pain does not worsen with exertion.  Notes that if she takes a drink of cold water and burps her symptoms improved.  She also has noted some chest pressure when laying in bed at night.  Feels like she has to sit up to have improvement in her pain.  She is currently on Protonix  for GERD.  She denies shortness of breath, palpitations.  Patient denies history of hypertension or hyperlipidemia.  She does not have family history of coronary artery disease.  She does not smoke.  Studies Reviewed EKG Interpretation Date/Time:  Thursday September 18 2024 11:13:54 EDT Ventricular Rate:  77 PR Interval:  188 QRS Duration:  90 QT Interval:  380 QTC Calculation: 430 R Axis:   -48  Text Interpretation: Normal sinus rhythm Left anterior fascicular block Moderate voltage criteria for LVH, may be normal variant ( R in aVL , Cornell product ) Anterior infarct , age undetermined When compared with ECG of 14-Apr-2024 13:41, PREVIOUS ECG IS PRESENT Confirmed by Tami Martin [phone]) on 09/18/2024 12:06:15 PM     Risk Assessment/Calculations           Physical Exam VS:  BP 133/84 (BP Location: Left Arm, Patient Position:  Sitting, Cuff Size: Large)   Pulse 77   Resp 16   Ht 5' 6 (1.676 m)   Wt 202 lb (91.6 kg)   LMP 01/02/2023   SpO2 96%   BMI 32.60 kg/m        Wt Readings from Last 3 Encounters:  09/18/24 202 lb (91.6 kg)  09/15/24 204 lb (92.5 kg)  08/14/24 207 lb (93.9 kg)    GEN: Well nourished, well developed in no acute distress. Sitting comfortably in the chair  NECK: No JVD  CARDIAC:  RRR, no murmurs, rubs, gallops RESPIRATORY:  Clear to auscultation without rales, wheezing or rhonchi. Normal WOB on room air   ABDOMEN: Soft, non-tender, non-distended EXTREMITIES:  No edema in BLE; No deformity   ASSESSMENT AND PLAN  Chest pain  - Patient presents today for evaluation of chest pain.  Pain has been intermittent for the past week.  Occurs both at rest and with exertion.  Also can occur when she is laying in bed at night.  Relieved by burping or drinking cold water - EKG in clinic today was without ischemic changes - Started aspirin 81 mg daily - Ordered coronary CTA-creatinine 0.61 on 05/23/2024 - LDL 53 in 08/2023.  Pending coronary calcium score, could consider starting statin therapy if needed - Ordered echocardiogram - If coronary CTA and echocardiogram unrevealing, recommend further GI workup as pain is concerning for acid reflux  GERD  - Patient has been seen by  her primary care provider for abdominal pain. Treated with Protonix   -Encouraged patient to try over-the-counter supplements such as Tums when she has episodes of acid reflux   Dispo: Follow up in 3 months with Tami Martin   Signed, Tami FABIENE Louder, PA-C

## 2024-10-08 ENCOUNTER — Ambulatory Visit (HOSPITAL_COMMUNITY)

## 2024-11-06 ENCOUNTER — Ambulatory Visit (HOSPITAL_COMMUNITY): Admission: RE | Admit: 2024-11-06 | Discharge: 2024-11-06 | Attending: Cardiology

## 2024-11-06 DIAGNOSIS — R072 Precordial pain: Secondary | ICD-10-CM | POA: Diagnosis present

## 2024-11-06 LAB — ECHOCARDIOGRAM COMPLETE
Area-P 1/2: 6.22 cm2
S' Lateral: 2.6 cm

## 2024-11-07 ENCOUNTER — Ambulatory Visit: Payer: Self-pay | Admitting: Cardiology

## 2024-11-19 ENCOUNTER — Ambulatory Visit (HOSPITAL_COMMUNITY)

## 2024-12-05 ENCOUNTER — Ambulatory Visit (HOSPITAL_COMMUNITY)

## 2024-12-10 ENCOUNTER — Ambulatory Visit (HOSPITAL_COMMUNITY)
Admission: RE | Admit: 2024-12-10 | Discharge: 2024-12-10 | Disposition: A | Discharge status: Home / Self Care | Source: Ambulatory Visit | Attending: Internal Medicine | Admitting: Internal Medicine

## 2024-12-10 DIAGNOSIS — R072 Precordial pain: Secondary | ICD-10-CM | POA: Diagnosis present

## 2024-12-10 MED ORDER — METOPROLOL TARTRATE 5 MG/5ML IV SOLN
10.0000 mg | INTRAVENOUS | Status: DC | PRN
  Administered 2024-12-10: 5 mg via INTRAVENOUS

## 2024-12-10 MED ORDER — DILTIAZEM HCL 25 MG/5ML IV SOLN: 10.0000 mg | INTRAVENOUS | Status: DC | PRN

## 2024-12-10 MED ORDER — NITROGLYCERIN 0.4 MG SL SUBL
0.8000 mg | SUBLINGUAL_TABLET | Freq: Once | SUBLINGUAL | Status: AC
  Administered 2024-12-10: 0.8 mg via SUBLINGUAL

## 2024-12-10 MED ORDER — IOHEXOL 350 MG/ML SOLN
100.0000 mL | Freq: Once | INTRAVENOUS | Status: AC | PRN
  Administered 2024-12-10: 100 mL via INTRAVENOUS

## 2024-12-19 ENCOUNTER — Ambulatory Visit: Admitting: Student in an Organized Health Care Education/Training Program

## 2024-12-22 ENCOUNTER — Encounter: Payer: Self-pay | Admitting: *Deleted

## 2024-12-22 ENCOUNTER — Telehealth: Payer: Self-pay | Admitting: Cardiology

## 2024-12-22 NOTE — Telephone Encounter (Signed)
 Left detailed VM with Vicci, PA's recs:   Please let patient know that her CT showed a 5 mm nodule. She should follow up with her primary care provider to discuss this further and determine if repeat testing is needed.    Thanks! KJ   -Vicci Rollo SAUNDERS, PA-C

## 2024-12-22 NOTE — Telephone Encounter (Signed)
 Received incoming call from [place] at [facility] regarding incidental findings on CCTA (12/10/2024):   5 mm Right Middle Lobe Pulmonary Nodule.

## 2024-12-22 NOTE — Telephone Encounter (Signed)
 Letter mailed   12/22/24

## 2024-12-22 NOTE — Telephone Encounter (Signed)
 Tami Martin with [facility] is calling to give call report for abnormal addended CT.

## 2025-01-02 ENCOUNTER — Ambulatory Visit

## 2025-01-02 ENCOUNTER — Encounter: Payer: Self-pay | Admitting: Internal Medicine

## 2025-01-02 ENCOUNTER — Ambulatory Visit: Admitting: Internal Medicine

## 2025-01-02 VITALS — BP 130/80 | HR 71 | Temp 98.7°F | Ht 66.0 in | Wt 211.0 lb

## 2025-01-02 DIAGNOSIS — R911 Solitary pulmonary nodule: Secondary | ICD-10-CM | POA: Diagnosis not present

## 2025-01-02 DIAGNOSIS — Z23 Encounter for immunization: Secondary | ICD-10-CM

## 2025-01-02 DIAGNOSIS — M79642 Pain in left hand: Secondary | ICD-10-CM

## 2025-01-02 DIAGNOSIS — E119 Type 2 diabetes mellitus without complications: Secondary | ICD-10-CM | POA: Diagnosis not present

## 2025-01-02 LAB — LIPID PANEL
Cholesterol: 122 mg/dL (ref 28–200)
HDL: 45.6 mg/dL
LDL Cholesterol: 61 mg/dL (ref 10–99)
NonHDL: 76.43
Total CHOL/HDL Ratio: 3
Triglycerides: 78 mg/dL (ref 10.0–149.0)
VLDL: 15.6 mg/dL (ref 0.0–40.0)

## 2025-01-02 LAB — MICROALBUMIN / CREATININE URINE RATIO
Creatinine,U: 92.6 mg/dL
Microalb Creat Ratio: 9.6 mg/g (ref 0.0–30.0)
Microalb, Ur: 0.9 mg/dL (ref 0.7–1.9)

## 2025-01-02 LAB — COMPREHENSIVE METABOLIC PANEL WITH GFR
ALT: 27 U/L (ref 3–35)
AST: 26 U/L (ref 5–37)
Albumin: 3.8 g/dL (ref 3.5–5.2)
Alkaline Phosphatase: 86 U/L (ref 39–117)
BUN: 11 mg/dL (ref 6–23)
CO2: 26 meq/L (ref 19–32)
Calcium: 9.1 mg/dL (ref 8.4–10.5)
Chloride: 104 meq/L (ref 96–112)
Creatinine, Ser: 0.58 mg/dL (ref 0.40–1.20)
GFR: 102.84 mL/min
Glucose, Bld: 149 mg/dL — ABNORMAL HIGH (ref 70–99)
Potassium: 3.9 meq/L (ref 3.5–5.1)
Sodium: 138 meq/L (ref 135–145)
Total Bilirubin: 0.6 mg/dL (ref 0.2–1.2)
Total Protein: 7.3 g/dL (ref 6.0–8.3)

## 2025-01-02 LAB — CBC
HCT: 43.7 % (ref 36.0–46.0)
Hemoglobin: 14.6 g/dL (ref 12.0–15.0)
MCHC: 33.4 g/dL (ref 30.0–36.0)
MCV: 84.8 fl (ref 78.0–100.0)
Platelets: 231 10*3/uL (ref 150.0–400.0)
RBC: 5.15 Mil/uL — ABNORMAL HIGH (ref 3.87–5.11)
RDW: 12.8 % (ref 11.5–15.5)
WBC: 3.7 10*3/uL — ABNORMAL LOW (ref 4.0–10.5)

## 2025-01-02 LAB — POCT GLYCOSYLATED HEMOGLOBIN (HGB A1C): Hemoglobin A1C: 6.8 % — AB (ref 4.0–5.6)

## 2025-01-02 NOTE — Progress Notes (Signed)
 "  Subjective:   Patient ID: Tami Martin, female    DOB: 04/09/1971, 54 y.o.   MRN: [id number]  Discussed the use of AI scribe software for clinical note transcription with the patient, who gave verbal consent to proceed.  History of Present Illness Tami Martin is a 54 year old female who presents for evaluation of a lung nodule and new elevated A1c levels.  A lung nodule measuring 5 mm was detected. She has never been a smoker.  Her hemoglobin A1c level is currently 6.8. She has a history of fluctuating A1c levels, previously in the prediabetes range, with occasional readings at 6.5 over the past three to four years. Her A1c levels have varied with lifestyle changes  She experiences pain in her left hand, particularly in the area extending down to her fingers, which has been persistent and affects her ability to make a fist.  She has a history of seeing a doctor for knee issues, which are currently stable but fluctuate in severity.  No current stomach pain, although she has experienced nausea and vomiting when taking certain medications in the past. Her appetite and eating habits are currently stable.  Review of Systems  Constitutional: Negative.   HENT: Negative.    Eyes: Negative.   Respiratory:  Negative for cough, chest tightness and shortness of breath.   Cardiovascular:  Negative for chest pain, palpitations and leg swelling.  Gastrointestinal:  Negative for abdominal distention, abdominal pain, constipation, diarrhea, nausea and vomiting.  Musculoskeletal:  Positive for arthralgias and myalgias.  Skin: Negative.   Neurological: Negative.   Psychiatric/Behavioral: Negative.      Objective:  Physical Exam Constitutional:      Appearance: She is well-developed.  HENT:     Head: Normocephalic and atraumatic.  Cardiovascular:     Rate and Rhythm: Normal rate and regular rhythm.  Pulmonary:     Effort: Pulmonary effort is normal. No respiratory distress.      Breath sounds: Normal breath sounds. No wheezing or rales.  Abdominal:     General: Bowel sounds are normal. There is no distension.     Palpations: Abdomen is soft.     Tenderness: There is no abdominal tenderness.  Musculoskeletal:        General: Tenderness present.     Cervical back: Normal range of motion.  Skin:    General: Skin is warm and dry.  Neurological:     Mental Status: She is alert and oriented to person, place, and time.     Coordination: Coordination normal.     Vitals:   01/02/25 1006  BP: 130/80  Pulse: 71  Temp: 98.7 F (37.1 C)  TempSrc: Oral  SpO2: 98%  Weight: 211 lb (95.7 kg)  Height: 5' 6 (1.676 m)   Flu shot given at visit  Assessment and Plan Assessment & Plan Newly diagnosed diabetes   POC Hemoglobin A1c of 6.8 confirms diabetes. No medication is needed; focus on lifestyle changes. Foot exam done and checking UACR and lipid panel and CMP today. Counseled about eye exam needed.  She is referred to a pharmacist for diabetes education and dietary training. Participation in a nutrition training program is encouraged. Advised on diet and exercise modifications. A follow-up is scheduled in 3 months to reassess blood sugar levels.  Solitary pulmonary nodule   A 5 mm nodule is identified with low cancer risk due to her non-smoking status. No risk from the nodule. No further imaging or intervention is required.  Pain in left hand   Pain in the left hand, likely due to tendon inflammation, affects her ability to make a fist. An x-ray of the left hand is ordered to evaluate the cause of pain.   "

## 2025-01-02 NOTE — Patient Instructions (Addendum)
 We will do the labs today and the x-ray of the hand.   Your sugars are now in the diabetes range. We will have the pharmacist reach out to you to help with some education.   Come back in 3 months and work on diet and exercise to help this you do not need any specific medicine for the sugars today.

## 2025-01-02 NOTE — Assessment & Plan Note (Signed)
 Incidentally seen on cardiac CT 5 mm. She is never smoker and low risk. This does not require any further follow up.

## 2025-01-05 ENCOUNTER — Telehealth: Payer: Self-pay

## 2025-01-05 NOTE — Progress Notes (Unsigned)
 Care Guide Pharmacy Note  01/05/2025 Name: Tami Martin MRN: [id number] DOB: September 28, 1971  Referred By: Rollene Almarie LABOR, MD Reason for referral: Call Attempt #1 and Complex Care Management (Outreach to sch ref w/ pharm)   Tami Martin is a 54 y.o. year old female who is a primary care patient of Rollene Almarie LABOR, MD.  Tami Martin was referred to the pharmacist for assistance related to: DMII  An unsuccessful telephone outreach was attempted today to contact the patient who was referred to the pharmacy team for assistance with medication management. Additional attempts will be made to contact the patient.  Doyce Razor [facility], [facility] Guide Direct Dial: [phone]  Fax: [phone]

## 2025-01-06 ENCOUNTER — Ambulatory Visit: Payer: Self-pay | Admitting: Internal Medicine

## 2025-01-06 NOTE — Progress Notes (Unsigned)
 Care Guide Pharmacy Note  01/06/2025 Name: Tami Martin MRN: [id number] DOB: 02/28/1971  Referred By: Rollene Almarie LABOR, MD Reason for referral: Call Attempt #1, Complex Care Management (Outreach to sch ref w/ pharm), and Call Attempt #2   Tami Martin is a 54 y.o. year old female who is a primary care patient of Rollene Almarie LABOR, MD.  Tami Martin was referred to the pharmacist for assistance related to: DMII  A second unsuccessful telephone outreach was attempted today to contact the patient who was referred to the pharmacy team for assistance with medication management. Additional attempts will be made to contact the patient.  Doyce Razor [facility], [facility] Guide Direct Dial: [phone]  Fax: [phone]

## 2025-01-07 NOTE — Progress Notes (Signed)
 Care Guide Pharmacy Note  01/07/2025 Name: Tami Martin MRN: [id number] DOB: 1971-06-11  Referred By: Rollene Almarie LABOR, MD Reason for referral: Call Attempt #1, Complex Care Management (Outreach to sch ref w/ pharm), Call Attempt #2, and Call Attempt #3   Tami Martin is a 54 y.o. year old female who is a primary care patient of Rollene Almarie LABOR, MD.  Tami Martin was referred to the pharmacist for assistance related to: DMII  A third unsuccessful telephone outreach was attempted today to contact the patient who was referred to the pharmacy team for assistance with medication management. The Population Health team is pleased to engage with this patient at any time in the future upon receipt of referral and should he/she be interested in assistance from the [employer] [facility] team.  Tami Martin [facility]  [facility], [facility] Guide Direct Dial: [phone]  Fax: [phone]
